# Patient Record
Sex: Female | Born: 1955 | Race: White | Hispanic: Yes | Marital: Single | Smoking: Never smoker
Health system: Southern US, Community
[De-identification: ages and names within clinical notes are randomized; demographics above are authoritative.]

## PROBLEM LIST (undated history)

## (undated) DIAGNOSIS — E119 Type 2 diabetes mellitus without complications: Secondary | ICD-10-CM

## (undated) DIAGNOSIS — D649 Anemia, unspecified: Secondary | ICD-10-CM

## (undated) DIAGNOSIS — E785 Hyperlipidemia, unspecified: Secondary | ICD-10-CM

## (undated) DIAGNOSIS — E669 Obesity, unspecified: Secondary | ICD-10-CM

## (undated) DIAGNOSIS — J45909 Unspecified asthma, uncomplicated: Secondary | ICD-10-CM

## (undated) DIAGNOSIS — M1990 Unspecified osteoarthritis, unspecified site: Secondary | ICD-10-CM

## (undated) DIAGNOSIS — I10 Essential (primary) hypertension: Secondary | ICD-10-CM

## (undated) HISTORY — DX: Unspecified osteoarthritis, unspecified site: M19.90

## (undated) HISTORY — DX: Type 2 diabetes mellitus without complications: E11.9

## (undated) HISTORY — DX: Obesity, unspecified: E66.9

## (undated) HISTORY — PX: HERNIA REPAIR: SHX51

## (undated) HISTORY — PX: UPPER GASTROINTESTINAL ENDOSCOPY: SHX188

## (undated) HISTORY — DX: Anemia, unspecified: D64.9

## (undated) HISTORY — DX: Essential (primary) hypertension: I10

## (undated) HISTORY — PX: OVARIAN CYST REMOVAL: SHX89

## (undated) HISTORY — DX: Hyperlipidemia, unspecified: E78.5

## (undated) HISTORY — DX: Unspecified asthma, uncomplicated: J45.909

## (undated) HISTORY — PX: COLONOSCOPY: SHX174

---

## 2017-07-07 ENCOUNTER — Other Ambulatory Visit: Payer: Self-pay

## 2017-07-07 ENCOUNTER — Ambulatory Visit (INDEPENDENT_AMBULATORY_CARE_PROVIDER_SITE_OTHER): Payer: BLUE CROSS/BLUE SHIELD | Admitting: Emergency Medicine

## 2017-07-07 ENCOUNTER — Encounter: Payer: Self-pay | Admitting: Emergency Medicine

## 2017-07-07 VITALS — BP 124/84 | HR 85 | Temp 97.9°F | Resp 16 | Ht 61.0 in | Wt 210.6 lb

## 2017-07-07 DIAGNOSIS — Z7689 Persons encountering health services in other specified circumstances: Secondary | ICD-10-CM

## 2017-07-07 DIAGNOSIS — Z1231 Encounter for screening mammogram for malignant neoplasm of breast: Secondary | ICD-10-CM | POA: Diagnosis not present

## 2017-07-07 DIAGNOSIS — Z23 Encounter for immunization: Secondary | ICD-10-CM | POA: Diagnosis not present

## 2017-07-07 DIAGNOSIS — E785 Hyperlipidemia, unspecified: Secondary | ICD-10-CM | POA: Diagnosis not present

## 2017-07-07 DIAGNOSIS — Z1239 Encounter for other screening for malignant neoplasm of breast: Secondary | ICD-10-CM | POA: Insufficient documentation

## 2017-07-07 DIAGNOSIS — I10 Essential (primary) hypertension: Secondary | ICD-10-CM

## 2017-07-07 DIAGNOSIS — E1159 Type 2 diabetes mellitus with other circulatory complications: Secondary | ICD-10-CM | POA: Insufficient documentation

## 2017-07-07 MED ORDER — AMLODIPINE BESYLATE 5 MG PO TABS: 5.0000 mg | ORAL_TABLET | Freq: Every day | ORAL | 3 refills | Status: DC

## 2017-07-07 MED ORDER — ATORVASTATIN CALCIUM 40 MG PO TABS: 40.0000 mg | ORAL_TABLET | Freq: Every day | ORAL | 3 refills | Status: DC

## 2017-07-07 NOTE — Progress Notes (Signed)
Catherine Cline 62 y.o.   Chief Complaint  Patient presents with  . Establish Care  . Medication Refill    Amlodipine and Atorvastatin    HISTORY OF PRESENT ILLNESS: This is a 62 y.o. female with a history of hypertension and high cholesterol, on amlodipine and atorvastatin, here to establish care.  Has no complaints.   HPI   Prior to Admission medications   Medication Sig Start Date End Date Taking? Authorizing Provider  amLODipine (NORVASC) 5 MG tablet Take 5 mg by mouth daily.   Yes [provider]  atorvastatin (LIPITOR) 40 MG tablet Take 40 mg by mouth daily.   Yes [provider]  Cholecalciferol (VITAMIN D3) 5000 units CAPS Take by mouth daily.   Yes [provider]  Flaxseed, Linseed, (FLAXSEED OIL PO) Take by mouth daily.   Yes [provider]  Meloxicam 15 MG TBDP Take by mouth daily.   Yes [provider]  Multiple Vitamin (MULTIVITAMIN) tablet Take 1 tablet by mouth daily.   Yes [provider]    No Known Allergies  There are no active problems to display for this patient.   Past Medical History:  Diagnosis Date  . Arthritis   . Asthma   . Hypertension     Past Surgical History:  Procedure Laterality Date  . OVARIAN CYST REMOVAL      Social History   Socioeconomic History  . Marital status: Single    Spouse name: Not on file  . Number of children: Not on file  . Years of education: Not on file  . Highest education level: Not on file  Occupational History  . Not on file  Social Needs  . Financial resource strain: Not on file  . Food insecurity:    Worry: Not on file    Inability: Not on file  . Transportation needs:    Medical: Not on file    Non-medical: Not on file  Tobacco Use  . Smoking status: Never Smoker  . Smokeless tobacco: Never Used  Substance and Sexual Activity  . Alcohol use: Not Currently    Comment: socially  . Drug use: Never  . Sexual activity: Not on file    Lifestyle  . Physical activity:    Days per week: Not on file    Minutes per session: Not on file  . Stress: Not on file  Relationships  . Social connections:    Talks on phone: Not on file    Gets together: Not on file    Attends religious service: Not on file    Active member of club or organization: Not on file    Attends meetings of clubs or organizations: Not on file    Relationship status: Not on file  . Intimate partner violence:    Fear of current or ex partner: Not on file    Emotionally abused: Not on file    Physically abused: Not on file    Forced sexual activity: Not on file  Other Topics Concern  . Not on file  Social History Narrative  . Not on file    Family History  Problem Relation Age of Onset  . Heart disease Mother   . Hypertension Mother   . Stroke Mother   . Diabetes Sister   . Hypertension Sister      Review of Systems  Constitutional: Negative.  Negative for chills and fever.  HENT: Negative.   Eyes: Negative.   Respiratory: Negative.  Negative for cough and shortness of breath.   Cardiovascular: Negative.  Negative for chest pain and palpitations.  Gastrointestinal: Negative.  Negative for abdominal pain, nausea and vomiting.  Genitourinary: Negative.  Negative for hematuria.  Musculoskeletal: Positive for joint pain (knees). Negative for back pain, myalgias and neck pain.  Skin: Negative.  Negative for rash.  Neurological: Negative.  Negative for dizziness and headaches.  Endo/Heme/Allergies: Negative.   All other systems reviewed and are negative.   Vitals:   07/07/17 1534  BP: 124/84  Pulse: 85  Resp: 16  Temp: 97.9 F (36.6 C)  SpO2: 95%    Physical Exam  Constitutional: She is oriented to person, place, and time. She appears well-developed and well-nourished.  HENT:  Head: Normocephalic and atraumatic.  Right Ear: External ear normal.  Left Ear: External ear normal.  Nose: Nose normal.  Mouth/Throat: Oropharynx is  clear and moist.  Eyes: Pupils are equal, round, and reactive to light. Conjunctivae are normal.  Neck: Normal range of motion. Neck supple. No JVD present. No thyromegaly present.  Cardiovascular: Normal rate, regular rhythm and normal heart sounds.  Pulmonary/Chest: Effort normal and breath sounds normal.  Abdominal: Soft. Bowel sounds are normal. There is no tenderness.  Musculoskeletal: Normal range of motion.  Lymphadenopathy:    She has no cervical adenopathy.  Neurological: She is alert and oriented to person, place, and time. No sensory deficit. She exhibits normal muscle tone.  Skin: Skin is warm and dry. Capillary refill takes less than 2 seconds.  Psychiatric: She has a normal mood and affect. Her behavior is normal.  Vitals reviewed.  A total of 30 minutes was spent in the room with the patient, greater than 50% of which was in counseling/coordination of care regarding chronic medical conditions, management, medications, nutrition, and need for follow-up.   ASSESSMENT & PLAN: Arzella was seen today for establish care and medication refill.  Diagnoses and all orders for this visit:  Essential hypertension  Need for diphtheria-tetanus-pertussis (Tdap) vaccine -     Tdap vaccine greater than or equal to 7yo IM  Hyperlipidemia, unspecified hyperlipidemia type  Encounter to establish care  Breast cancer screening -     MM Digital Screening; Future  Other orders -     Discontinue: amLODipine (NORVASC) 5 MG tablet; Take 1 tablet (5 mg total) by mouth daily. -     Discontinue: atorvastatin (LIPITOR) 40 MG tablet; Take 1 tablet (40 mg total) by mouth daily. -     atorvastatin (LIPITOR) 40 MG tablet; Take 1 tablet (40 mg total) by mouth daily. -     amLODipine (NORVASC) 5 MG tablet; Take 1 tablet (5 mg total) by mouth daily.    Patient Instructions       IF you received an x-ray today, you will receive an invoice from [facility]. Please contact [facility] at [phone] with questions or concerns regarding your invoice.   IF you received labwork today, you will receive an invoice from [facility]. Please contact [facility] at [phone] with questions or concerns regarding your invoice.   Our billing staff will not be able to assist you with questions regarding bills from these companies.  You will be contacted with the lab results as soon as they are available. The fastest way to get your results is to activate your My Chart account. Instructions are located on the last page of this paperwork. If you have not heard from [place] regarding the results in 2 weeks, please contact  this office.     Hypertension Hypertension, commonly called high blood pressure, is when the force of blood pumping through the arteries is too strong. The arteries are the blood vessels that carry blood from the heart throughout the body. Hypertension forces the heart to work harder to pump blood and may cause arteries to become narrow or stiff. Having untreated or uncontrolled hypertension can cause heart attacks, strokes, kidney disease, and other problems. A blood pressure reading consists of a higher number over a lower number. Ideally, your blood pressure should be below 120/80. The first ("top") number is called the systolic pressure. It is a measure of the pressure in your arteries as your heart beats. The second ("bottom") number is called the diastolic pressure. It is a measure of the pressure in your arteries as the heart relaxes. What are the causes? The cause of this condition is not known. What increases the risk? Some risk factors for high blood pressure are under your control. Others are not. Factors you can change  Smoking.  Having type 2 diabetes mellitus, high cholesterol, or both.  Not getting enough exercise or physical activity.  Being overweight.  Having too much fat, sugar, calories, or salt (sodium) in your diet.  Drinking too much  alcohol. Factors that are difficult or impossible to change  Having chronic kidney disease.  Having a family history of high blood pressure.  Age. Risk increases with age.  Race. You may be at higher risk if you are African-American.  Gender. Men are at higher risk than women before age 33. After age 51, women are at higher risk than men.  Having obstructive sleep apnea.  Stress. What are the signs or symptoms? Extremely high blood pressure (hypertensive crisis) may cause:  Headache.  Anxiety.  Shortness of breath.  Nosebleed.  Nausea and vomiting.  Severe chest pain.  Jerky movements you cannot control (seizures).  How is this diagnosed? This condition is diagnosed by measuring your blood pressure while you are seated, with your arm resting on a surface. The cuff of the blood pressure monitor will be placed directly against the skin of your upper arm at the level of your heart. It should be measured at least twice using the same arm. Certain conditions can cause a difference in blood pressure between your right and left arms. Certain factors can cause blood pressure readings to be lower or higher than normal (elevated) for a short period of time:  When your blood pressure is higher when you are in a health care provider's office than when you are at home, this is called white coat hypertension. Most people with this condition do not need medicines.  When your blood pressure is higher at home than when you are in a health care provider's office, this is called masked hypertension. Most people with this condition may need medicines to control blood pressure.  If you have a high blood pressure reading during one visit or you have normal blood pressure with other risk factors:  You may be asked to return on a different day to have your blood pressure checked again.  You may be asked to monitor your blood pressure at home for 1 week or longer.  If you are diagnosed with  hypertension, you may have other blood or imaging tests to help your health care provider understand your overall risk for other conditions. How is this treated? This condition is treated by making healthy lifestyle changes, such as eating healthy foods, exercising  more, and reducing your alcohol intake. Your health care provider may prescribe medicine if lifestyle changes are not enough to get your blood pressure under control, and if:  Your systolic blood pressure is above 130.  Your diastolic blood pressure is above 80.  Your personal target blood pressure may vary depending on your medical conditions, your age, and other factors. Follow these instructions at home: Eating and drinking  Eat a diet that is high in fiber and potassium, and low in sodium, added sugar, and fat. An example eating plan is called the DASH (Dietary Approaches to Stop Hypertension) diet. To eat this way: ? Eat plenty of fresh fruits and vegetables. Try to fill half of your plate at each meal with fruits and vegetables. ? Eat whole grains, such as whole wheat pasta, brown rice, or whole grain bread. Fill about one quarter of your plate with whole grains. ? Eat or drink low-fat dairy products, such as skim milk or low-fat yogurt. ? Avoid fatty cuts of meat, processed or cured meats, and poultry with skin. Fill about one quarter of your plate with lean proteins, such as fish, chicken without skin, beans, eggs, and tofu. ? Avoid premade and processed foods. These tend to be higher in sodium, added sugar, and fat.  Reduce your daily sodium intake. Most people with hypertension should eat less than 1,500 mg of sodium a day.  Limit alcohol intake to no more than 1 drink a day for nonpregnant women and 2 drinks a day for men. One drink equals 12 oz of beer, 5 oz of wine, or 1 oz of hard liquor. Lifestyle  Work with your health care provider to maintain a healthy body weight or to lose weight. Ask what an ideal weight is  for you.  Get at least 30 minutes of exercise that causes your heart to beat faster (aerobic exercise) most days of the week. Activities may include walking, swimming, or biking.  Include exercise to strengthen your muscles (resistance exercise), such as pilates or lifting weights, as part of your weekly exercise routine. Try to do these types of exercises for 30 minutes at least 3 days a week.  Do not use any products that contain nicotine or tobacco, such as cigarettes and e-cigarettes. If you need help quitting, ask your health care provider.  Monitor your blood pressure at home as told by your health care provider.  Keep all follow-up visits as told by your health care provider. This is important. Medicines  Take over-the-counter and prescription medicines only as told by your health care provider. Follow directions carefully. Blood pressure medicines must be taken as prescribed.  Do not skip doses of blood pressure medicine. Doing this puts you at risk for problems and can make the medicine less effective.  Ask your health care provider about side effects or reactions to medicines that you should watch for. Contact a health care provider if:  You think you are having a reaction to a medicine you are taking.  You have headaches that keep coming back (recurring).  You feel dizzy.  You have swelling in your ankles.  You have trouble with your vision. Get help right away if:  You develop a severe headache or confusion.  You have unusual weakness or numbness.  You feel faint.  You have severe pain in your chest or abdomen.  You vomit repeatedly.  You have trouble breathing. Summary  Hypertension is when the force of blood pumping through your arteries is too  strong. If this condition is not controlled, it may put you at risk for serious complications.  Your personal target blood pressure may vary depending on your medical conditions, your age, and other factors. For most  people, a normal blood pressure is less than 120/80.  Hypertension is treated with lifestyle changes, medicines, or a combination of both. Lifestyle changes include weight loss, eating a healthy, low-sodium diet, exercising more, and limiting alcohol. This information is not intended to replace advice given to you by your health care provider. Make sure you discuss any questions you have with your health care provider. Document Released: 01/20/2005 Document Revised: 12/19/2015 Document Reviewed: 12/19/2015 Elsevier Interactive Patient Education  2018 [employer] Inc.      Agustina Caroli, MD [facility]

## 2017-07-07 NOTE — Patient Instructions (Addendum)
   IF you received an x-ray today, you will receive an invoice from [facility]. Please contact [facility] at [phone] with questions or concerns regarding your invoice.   IF you received labwork today, you will receive an invoice from [facility]. Please contact [facility] at [phone] with questions or concerns regarding your invoice.   Our billing staff will not be able to assist you with questions regarding bills from these companies.  You will be contacted with the lab results as soon as they are available. The fastest way to get your results is to activate your My Chart account. Instructions are located on the last page of this paperwork. If you have not heard from us regarding the results in 2 weeks, please contact this office.     Hypertension Hypertension, commonly called high blood pressure, is when the force of blood pumping through the arteries is too strong. The arteries are the blood vessels that carry blood from the heart throughout the body. Hypertension forces the heart to work harder to pump blood and may cause arteries to become narrow or stiff. Having untreated or uncontrolled hypertension can cause heart attacks, strokes, kidney disease, and other problems. A blood pressure reading consists of a higher number over a lower number. Ideally, your blood pressure should be below 120/80. The first ("top") number is called the systolic pressure. It is a measure of the pressure in your arteries as your heart beats. The second ("bottom") number is called the diastolic pressure. It is a measure of the pressure in your arteries as the heart relaxes. What are the causes? The cause of this condition is not known. What increases the risk? Some risk factors for high blood pressure are under your control. Others are not. Factors you can change  Smoking.  Having type 2 diabetes mellitus, high cholesterol, or both.  Not getting enough exercise or physical  activity.  Being overweight.  Having too much fat, sugar, calories, or salt (sodium) in your diet.  Drinking too much alcohol. Factors that are difficult or impossible to change  Having chronic kidney disease.  Having a family history of high blood pressure.  Age. Risk increases with age.  Race. You may be at higher risk if you are African-American.  Gender. Men are at higher risk than women before age 45. After age 65, women are at higher risk than men.  Having obstructive sleep apnea.  Stress. What are the signs or symptoms? Extremely high blood pressure (hypertensive crisis) may cause:  Headache.  Anxiety.  Shortness of breath.  Nosebleed.  Nausea and vomiting.  Severe chest pain.  Jerky movements you cannot control (seizures).  How is this diagnosed? This condition is diagnosed by measuring your blood pressure while you are seated, with your arm resting on a surface. The cuff of the blood pressure monitor will be placed directly against the skin of your upper arm at the level of your heart. It should be measured at least twice using the same arm. Certain conditions can cause a difference in blood pressure between your right and left arms. Certain factors can cause blood pressure readings to be lower or higher than normal (elevated) for a short period of time:  When your blood pressure is higher when you are in a health care provider's office than when you are at home, this is called white coat hypertension. Most people with this condition do not need medicines.  When your blood pressure is higher at home than when you   are in a health care provider's office, this is called masked hypertension. Most people with this condition may need medicines to control blood pressure.  If you have a high blood pressure reading during one visit or you have normal blood pressure with other risk factors:  You may be asked to return on a different day to have your blood pressure  checked again.  You may be asked to monitor your blood pressure at home for 1 week or longer.  If you are diagnosed with hypertension, you may have other blood or imaging tests to help your health care provider understand your overall risk for other conditions. How is this treated? This condition is treated by making healthy lifestyle changes, such as eating healthy foods, exercising more, and reducing your alcohol intake. Your health care provider may prescribe medicine if lifestyle changes are not enough to get your blood pressure under control, and if:  Your systolic blood pressure is above 130.  Your diastolic blood pressure is above 80.  Your personal target blood pressure may vary depending on your medical conditions, your age, and other factors. Follow these instructions at home: Eating and drinking  Eat a diet that is high in fiber and potassium, and low in sodium, added sugar, and fat. An example eating plan is called the DASH (Dietary Approaches to Stop Hypertension) diet. To eat this way: ? Eat plenty of fresh fruits and vegetables. Try to fill half of your plate at each meal with fruits and vegetables. ? Eat whole grains, such as whole wheat pasta, brown rice, or whole grain bread. Fill about one quarter of your plate with whole grains. ? Eat or drink low-fat dairy products, such as skim milk or low-fat yogurt. ? Avoid fatty cuts of meat, processed or cured meats, and poultry with skin. Fill about one quarter of your plate with lean proteins, such as fish, chicken without skin, beans, eggs, and tofu. ? Avoid premade and processed foods. These tend to be higher in sodium, added sugar, and fat.  Reduce your daily sodium intake. Most people with hypertension should eat less than 1,500 mg of sodium a day.  Limit alcohol intake to no more than 1 drink a day for nonpregnant women and 2 drinks a day for men. One drink equals 12 oz of beer, 5 oz of wine, or 1 oz of hard  liquor. Lifestyle  Work with your health care provider to maintain a healthy body weight or to lose weight. Ask what an ideal weight is for you.  Get at least 30 minutes of exercise that causes your heart to beat faster (aerobic exercise) most days of the week. Activities may include walking, swimming, or biking.  Include exercise to strengthen your muscles (resistance exercise), such as pilates or lifting weights, as part of your weekly exercise routine. Try to do these types of exercises for 30 minutes at least 3 days a week.  Do not use any products that contain nicotine or tobacco, such as cigarettes and e-cigarettes. If you need help quitting, ask your health care provider.  Monitor your blood pressure at home as told by your health care provider.  Keep all follow-up visits as told by your health care provider. This is important. Medicines  Take over-the-counter and prescription medicines only as told by your health care provider. Follow directions carefully. Blood pressure medicines must be taken as prescribed.  Do not skip doses of blood pressure medicine. Doing this puts you at risk for problems and   can make the medicine less effective.  Ask your health care provider about side effects or reactions to medicines that you should watch for. Contact a health care provider if:  You think you are having a reaction to a medicine you are taking.  You have headaches that keep coming back (recurring).  You feel dizzy.  You have swelling in your ankles.  You have trouble with your vision. Get help right away if:  You develop a severe headache or confusion.  You have unusual weakness or numbness.  You feel faint.  You have severe pain in your chest or abdomen.  You vomit repeatedly.  You have trouble breathing. Summary  Hypertension is when the force of blood pumping through your arteries is too strong. If this condition is not controlled, it may put you at risk for serious  complications.  Your personal target blood pressure may vary depending on your medical conditions, your age, and other factors. For most people, a normal blood pressure is less than 120/80.  Hypertension is treated with lifestyle changes, medicines, or a combination of both. Lifestyle changes include weight loss, eating a healthy, low-sodium diet, exercising more, and limiting alcohol. This information is not intended to replace advice given to you by your health care provider. Make sure you discuss any questions you have with your health care provider. Document Released: 01/20/2005 Document Revised: 12/19/2015 Document Reviewed: 12/19/2015 Elsevier Interactive Patient Education  2018 [employer]  

## 2017-07-17 ENCOUNTER — Other Ambulatory Visit: Payer: Self-pay

## 2017-07-17 ENCOUNTER — Ambulatory Visit (INDEPENDENT_AMBULATORY_CARE_PROVIDER_SITE_OTHER): Payer: BLUE CROSS/BLUE SHIELD | Admitting: Emergency Medicine

## 2017-07-17 ENCOUNTER — Encounter: Payer: Self-pay | Admitting: Emergency Medicine

## 2017-07-17 VITALS — BP 120/80 | HR 76 | Temp 98.3°F | Resp 16 | Ht 61.81 in | Wt 214.0 lb

## 2017-07-17 DIAGNOSIS — I10 Essential (primary) hypertension: Secondary | ICD-10-CM | POA: Diagnosis not present

## 2017-07-17 DIAGNOSIS — Z01818 Encounter for other preprocedural examination: Secondary | ICD-10-CM | POA: Diagnosis not present

## 2017-07-17 DIAGNOSIS — E785 Hyperlipidemia, unspecified: Secondary | ICD-10-CM | POA: Diagnosis not present

## 2017-07-17 LAB — POCT URINALYSIS DIP (MANUAL ENTRY)
BILIRUBIN UA: NEGATIVE mg/dL
Bilirubin, UA: NEGATIVE
Blood, UA: NEGATIVE
Glucose, UA: NEGATIVE mg/dL
Nitrite, UA: POSITIVE — AB
SPEC GRAV UA: 1.025 (ref 1.010–1.025)
Urobilinogen, UA: 0.2 E.U./dL
pH, UA: 7 (ref 5.0–8.0)

## 2017-07-17 NOTE — Patient Instructions (Addendum)
   IF you received an x-ray today, you will receive an invoice from [facility]. Please contact [facility] at [phone] with questions or concerns regarding your invoice.   IF you received labwork today, you will receive an invoice from [facility]. Please contact [facility] at [phone] with questions or concerns regarding your invoice.   Our billing staff will not be able to assist you with questions regarding bills from these companies.  You will be contacted with the lab results as soon as they are available. The fastest way to get your results is to activate your My Chart account. Instructions are located on the last page of this paperwork. If you have not heard from us regarding the results in 2 weeks, please contact this office.     Hypertension Hypertension, commonly called high blood pressure, is when the force of blood pumping through the arteries is too strong. The arteries are the blood vessels that carry blood from the heart throughout the body. Hypertension forces the heart to work harder to pump blood and may cause arteries to become narrow or stiff. Having untreated or uncontrolled hypertension can cause heart attacks, strokes, kidney disease, and other problems. A blood pressure reading consists of a higher number over a lower number. Ideally, your blood pressure should be below 120/80. The first ("top") number is called the systolic pressure. It is a measure of the pressure in your arteries as your heart beats. The second ("bottom") number is called the diastolic pressure. It is a measure of the pressure in your arteries as the heart relaxes. What are the causes? The cause of this condition is not known. What increases the risk? Some risk factors for high blood pressure are under your control. Others are not. Factors you can change  Smoking.  Having type 2 diabetes mellitus, high cholesterol, or both.  Not getting enough exercise or physical  activity.  Being overweight.  Having too much fat, sugar, calories, or salt (sodium) in your diet.  Drinking too much alcohol. Factors that are difficult or impossible to change  Having chronic kidney disease.  Having a family history of high blood pressure.  Age. Risk increases with age.  Race. You may be at higher risk if you are African-American.  Gender. Men are at higher risk than women before age 45. After age 65, women are at higher risk than men.  Having obstructive sleep apnea.  Stress. What are the signs or symptoms? Extremely high blood pressure (hypertensive crisis) may cause:  Headache.  Anxiety.  Shortness of breath.  Nosebleed.  Nausea and vomiting.  Severe chest pain.  Jerky movements you cannot control (seizures).  How is this diagnosed? This condition is diagnosed by measuring your blood pressure while you are seated, with your arm resting on a surface. The cuff of the blood pressure monitor will be placed directly against the skin of your upper arm at the level of your heart. It should be measured at least twice using the same arm. Certain conditions can cause a difference in blood pressure between your right and left arms. Certain factors can cause blood pressure readings to be lower or higher than normal (elevated) for a short period of time:  When your blood pressure is higher when you are in a health care provider's office than when you are at home, this is called white coat hypertension. Most people with this condition do not need medicines.  When your blood pressure is higher at home than when you   are in a health care provider's office, this is called masked hypertension. Most people with this condition may need medicines to control blood pressure.  If you have a high blood pressure reading during one visit or you have normal blood pressure with other risk factors:  You may be asked to return on a different day to have your blood pressure  checked again.  You may be asked to monitor your blood pressure at home for 1 week or longer.  If you are diagnosed with hypertension, you may have other blood or imaging tests to help your health care provider understand your overall risk for other conditions. How is this treated? This condition is treated by making healthy lifestyle changes, such as eating healthy foods, exercising more, and reducing your alcohol intake. Your health care provider may prescribe medicine if lifestyle changes are not enough to get your blood pressure under control, and if:  Your systolic blood pressure is above 130.  Your diastolic blood pressure is above 80.  Your personal target blood pressure may vary depending on your medical conditions, your age, and other factors. Follow these instructions at home: Eating and drinking  Eat a diet that is high in fiber and potassium, and low in sodium, added sugar, and fat. An example eating plan is called the DASH (Dietary Approaches to Stop Hypertension) diet. To eat this way: ? Eat plenty of fresh fruits and vegetables. Try to fill half of your plate at each meal with fruits and vegetables. ? Eat whole grains, such as whole wheat pasta, brown rice, or whole grain bread. Fill about one quarter of your plate with whole grains. ? Eat or drink low-fat dairy products, such as skim milk or low-fat yogurt. ? Avoid fatty cuts of meat, processed or cured meats, and poultry with skin. Fill about one quarter of your plate with lean proteins, such as fish, chicken without skin, beans, eggs, and tofu. ? Avoid premade and processed foods. These tend to be higher in sodium, added sugar, and fat.  Reduce your daily sodium intake. Most people with hypertension should eat less than 1,500 mg of sodium a day.  Limit alcohol intake to no more than 1 drink a day for nonpregnant women and 2 drinks a day for men. One drink equals 12 oz of beer, 5 oz of wine, or 1 oz of hard  liquor. Lifestyle  Work with your health care provider to maintain a healthy body weight or to lose weight. Ask what an ideal weight is for you.  Get at least 30 minutes of exercise that causes your heart to beat faster (aerobic exercise) most days of the week. Activities may include walking, swimming, or biking.  Include exercise to strengthen your muscles (resistance exercise), such as pilates or lifting weights, as part of your weekly exercise routine. Try to do these types of exercises for 30 minutes at least 3 days a week.  Do not use any products that contain nicotine or tobacco, such as cigarettes and e-cigarettes. If you need help quitting, ask your health care provider.  Monitor your blood pressure at home as told by your health care provider.  Keep all follow-up visits as told by your health care provider. This is important. Medicines  Take over-the-counter and prescription medicines only as told by your health care provider. Follow directions carefully. Blood pressure medicines must be taken as prescribed.  Do not skip doses of blood pressure medicine. Doing this puts you at risk for problems and   can make the medicine less effective.  Ask your health care provider about side effects or reactions to medicines that you should watch for. Contact a health care provider if:  You think you are having a reaction to a medicine you are taking.  You have headaches that keep coming back (recurring).  You feel dizzy.  You have swelling in your ankles.  You have trouble with your vision. Get help right away if:  You develop a severe headache or confusion.  You have unusual weakness or numbness.  You feel faint.  You have severe pain in your chest or abdomen.  You vomit repeatedly.  You have trouble breathing. Summary  Hypertension is when the force of blood pumping through your arteries is too strong. If this condition is not controlled, it may put you at risk for serious  complications.  Your personal target blood pressure may vary depending on your medical conditions, your age, and other factors. For most people, a normal blood pressure is less than 120/80.  Hypertension is treated with lifestyle changes, medicines, or a combination of both. Lifestyle changes include weight loss, eating a healthy, low-sodium diet, exercising more, and limiting alcohol. This information is not intended to replace advice given to you by your health care provider. Make sure you discuss any questions you have with your health care provider. Document Released: 01/20/2005 Document Revised: 12/19/2015 Document Reviewed: 12/19/2015 Elsevier Interactive Patient Education  2018 Elsevier Inc.  

## 2017-07-17 NOTE — Progress Notes (Signed)
Catherine Cline 62 y.o.   Chief Complaint  Patient presents with  . Surgical Clearance    HISTORY OF PRESENT ILLNESS: This is a 62 y.o. female scheduled for partial left knee replacement on July 1.  Here for surgical clearance.  Has a history of diabetes and high cholesterol.  On medications.  No other significant past medical history except childhood asthma.  HPI   Prior to Admission medications   Medication Sig Start Date End Date Taking? Authorizing Provider  amLODipine (NORVASC) 5 MG tablet Take 1 tablet (5 mg total) by mouth daily. 07/07/17  Yes Lukasz Rogus, Ines Bloomer, MD  atorvastatin (LIPITOR) 40 MG tablet Take 1 tablet (40 mg total) by mouth daily. 07/07/17  Yes Saamiya Jeppsen, Ines Bloomer, MD  Cholecalciferol (VITAMIN D3) 5000 units CAPS Take by mouth daily.   Yes [provider]  Flaxseed, Linseed, (FLAXSEED OIL PO) Take by mouth daily.   Yes [provider]  Meloxicam 15 MG TBDP Take by mouth daily.   Yes [provider]  Multiple Vitamin (MULTIVITAMIN) tablet Take 1 tablet by mouth daily.   Yes [provider]    No Known Allergies  Patient Active Problem List   Diagnosis Date Noted  . Essential hypertension 07/07/2017  . Hyperlipidemia 07/07/2017  . Breast cancer screening 07/07/2017    Past Medical History:  Diagnosis Date  . Arthritis   . Asthma   . Hypertension     Past Surgical History:  Procedure Laterality Date  . OVARIAN CYST REMOVAL      Social History   Socioeconomic History  . Marital status: Single    Spouse name: Not on file  . Number of children: Not on file  . Years of education: Not on file  . Highest education level: Not on file  Occupational History  . Not on file  Social Needs  . Financial resource strain: Not on file  . Food insecurity:    Worry: Not on file    Inability: Not on file  . Transportation needs:    Medical: Not on file    Non-medical: Not on file  Tobacco Use  . Smoking status: Never  Smoker  . Smokeless tobacco: Never Used  Substance and Sexual Activity  . Alcohol use: Not Currently    Comment: socially  . Drug use: Never  . Sexual activity: Not on file  Lifestyle  . Physical activity:    Days per week: Not on file    Minutes per session: Not on file  . Stress: Not on file  Relationships  . Social connections:    Talks on phone: Not on file    Gets together: Not on file    Attends religious service: Not on file    Active member of club or organization: Not on file    Attends meetings of clubs or organizations: Not on file    Relationship status: Not on file  . Intimate partner violence:    Fear of current or ex partner: Not on file    Emotionally abused: Not on file    Physically abused: Not on file    Forced sexual activity: Not on file  Other Topics Concern  . Not on file  Social History Narrative  . Not on file    Family History  Problem Relation Age of Onset  . Heart disease Mother   . Hypertension Mother   . Stroke Mother   . Diabetes Sister   . Hypertension Sister  Review of Systems  Constitutional: Negative.  Negative for fever and malaise/fatigue.  HENT: Negative.  Negative for congestion, nosebleeds and sore throat.   Eyes: Negative.  Negative for discharge and redness.  Respiratory: Negative for cough, shortness of breath and wheezing.   Cardiovascular: Positive for leg swelling (Ankle edema since starting amlodipine). Negative for chest pain and palpitations.  Gastrointestinal: Negative.  Negative for abdominal pain, nausea and vomiting.  Genitourinary: Negative.   Musculoskeletal: Negative.   Skin: Negative.  Negative for rash.  Neurological: Negative for dizziness and headaches.  Endo/Heme/Allergies: Negative.   All other systems reviewed and are negative.   Vitals:   07/17/17 1323  BP: 120/80  Pulse: 76  Resp: 16  Temp: 98.3 F (36.8 C)  SpO2: 96%    Physical Exam  Constitutional: She is oriented to person,  place, and time. She appears well-developed and well-nourished.  HENT:  Head: Normocephalic and atraumatic.  Right Ear: External ear normal.  Left Ear: External ear normal.  Nose: Nose normal.  Mouth/Throat: Oropharynx is clear and moist.  Eyes: Pupils are equal, round, and reactive to light. Conjunctivae and EOM are normal.  Neck: Normal range of motion. Neck supple. No JVD present. No thyromegaly present.  Cardiovascular: Normal rate, regular rhythm and normal heart sounds.  Pulmonary/Chest: Effort normal and breath sounds normal.  Abdominal: Soft. Bowel sounds are normal. She exhibits no distension. There is no tenderness.  Musculoskeletal: Normal range of motion. She exhibits no edema or tenderness.  Lymphadenopathy:    She has no cervical adenopathy.  Neurological: She is alert and oriented to person, place, and time. No sensory deficit. She exhibits normal muscle tone. Coordination normal.  Skin: Skin is warm and dry. Capillary refill takes less than 2 seconds.  Psychiatric: She has a normal mood and affect. Her behavior is normal.  Vitals reviewed.  EKG: Normal sinus rhythm with ventricular rate of 74.  No acute ischemic changes.  Normal EKG.  ASSESSMENT & PLAN: Malyia was seen today for surgical clearance.  Diagnoses and all orders for this visit:  Preoperative clearance -     CBC with Differential/Platelet -     Comprehensive metabolic panel -     Hemoglobin A1c -     POCT urinalysis dipstick -     EKG 12-Lead  Essential hypertension  Hyperlipidemia, unspecified hyperlipidemia type    Patient Instructions       IF you received an x-ray today, you will receive an invoice from [facility]. Please contact [facility] at [phone] with questions or concerns regarding your invoice.   IF you received labwork today, you will receive an invoice from [facility]. Please contact [facility] at [phone] with questions or concerns regarding your invoice.     Our billing staff will not be able to assist you with questions regarding bills from these companies.  You will be contacted with the lab results as soon as they are available. The fastest way to get your results is to activate your My Chart account. Instructions are located on the last page of this paperwork. If you have not heard from [place] regarding the results in 2 weeks, please contact this office.      Hypertension Hypertension, commonly called high blood pressure, is when the force of blood pumping through the arteries is too strong. The arteries are the blood vessels that carry blood from the heart throughout the body. Hypertension forces the heart to work harder to pump blood and may cause arteries to  become narrow or stiff. Having untreated or uncontrolled hypertension can cause heart attacks, strokes, kidney disease, and other problems. A blood pressure reading consists of a higher number over a lower number. Ideally, your blood pressure should be below 120/80. The first ("top") number is called the systolic pressure. It is a measure of the pressure in your arteries as your heart beats. The second ("bottom") number is called the diastolic pressure. It is a measure of the pressure in your arteries as the heart relaxes. What are the causes? The cause of this condition is not known. What increases the risk? Some risk factors for high blood pressure are under your control. Others are not. Factors you can change  Smoking.  Having type 2 diabetes mellitus, high cholesterol, or both.  Not getting enough exercise or physical activity.  Being overweight.  Having too much fat, sugar, calories, or salt (sodium) in your diet.  Drinking too much alcohol. Factors that are difficult or impossible to change  Having chronic kidney disease.  Having a family history of high blood pressure.  Age. Risk increases with age.  Race. You may be at higher risk if you are  African-American.  Gender. Men are at higher risk than women before age 22. After age 59, women are at higher risk than men.  Having obstructive sleep apnea.  Stress. What are the signs or symptoms? Extremely high blood pressure (hypertensive crisis) may cause:  Headache.  Anxiety.  Shortness of breath.  Nosebleed.  Nausea and vomiting.  Severe chest pain.  Jerky movements you cannot control (seizures).  How is this diagnosed? This condition is diagnosed by measuring your blood pressure while you are seated, with your arm resting on a surface. The cuff of the blood pressure monitor will be placed directly against the skin of your upper arm at the level of your heart. It should be measured at least twice using the same arm. Certain conditions can cause a difference in blood pressure between your right and left arms. Certain factors can cause blood pressure readings to be lower or higher than normal (elevated) for a short period of time:  When your blood pressure is higher when you are in a health care provider's office than when you are at home, this is called white coat hypertension. Most people with this condition do not need medicines.  When your blood pressure is higher at home than when you are in a health care provider's office, this is called masked hypertension. Most people with this condition may need medicines to control blood pressure.  If you have a high blood pressure reading during one visit or you have normal blood pressure with other risk factors:  You may be asked to return on a different day to have your blood pressure checked again.  You may be asked to monitor your blood pressure at home for 1 week or longer.  If you are diagnosed with hypertension, you may have other blood or imaging tests to help your health care provider understand your overall risk for other conditions. How is this treated? This condition is treated by making healthy lifestyle changes,  such as eating healthy foods, exercising more, and reducing your alcohol intake. Your health care provider may prescribe medicine if lifestyle changes are not enough to get your blood pressure under control, and if:  Your systolic blood pressure is above 130.  Your diastolic blood pressure is above 80.  Your personal target blood pressure may vary depending on your medical  conditions, your age, and other factors. Follow these instructions at home: Eating and drinking  Eat a diet that is high in fiber and potassium, and low in sodium, added sugar, and fat. An example eating plan is called the DASH (Dietary Approaches to Stop Hypertension) diet. To eat this way: ? Eat plenty of fresh fruits and vegetables. Try to fill half of your plate at each meal with fruits and vegetables. ? Eat whole grains, such as whole wheat pasta, brown rice, or whole grain bread. Fill about one quarter of your plate with whole grains. ? Eat or drink low-fat dairy products, such as skim milk or low-fat yogurt. ? Avoid fatty cuts of meat, processed or cured meats, and poultry with skin. Fill about one quarter of your plate with lean proteins, such as fish, chicken without skin, beans, eggs, and tofu. ? Avoid premade and processed foods. These tend to be higher in sodium, added sugar, and fat.  Reduce your daily sodium intake. Most people with hypertension should eat less than 1,500 mg of sodium a day.  Limit alcohol intake to no more than 1 drink a day for nonpregnant women and 2 drinks a day for men. One drink equals 12 oz of beer, 5 oz of wine, or 1 oz of hard liquor. Lifestyle  Work with your health care provider to maintain a healthy body weight or to lose weight. Ask what an ideal weight is for you.  Get at least 30 minutes of exercise that causes your heart to beat faster (aerobic exercise) most days of the week. Activities may include walking, swimming, or biking.  Include exercise to strengthen your muscles  (resistance exercise), such as pilates or lifting weights, as part of your weekly exercise routine. Try to do these types of exercises for 30 minutes at least 3 days a week.  Do not use any products that contain nicotine or tobacco, such as cigarettes and e-cigarettes. If you need help quitting, ask your health care provider.  Monitor your blood pressure at home as told by your health care provider.  Keep all follow-up visits as told by your health care provider. This is important. Medicines  Take over-the-counter and prescription medicines only as told by your health care provider. Follow directions carefully. Blood pressure medicines must be taken as prescribed.  Do not skip doses of blood pressure medicine. Doing this puts you at risk for problems and can make the medicine less effective.  Ask your health care provider about side effects or reactions to medicines that you should watch for. Contact a health care provider if:  You think you are having a reaction to a medicine you are taking.  You have headaches that keep coming back (recurring).  You feel dizzy.  You have swelling in your ankles.  You have trouble with your vision. Get help right away if:  You develop a severe headache or confusion.  You have unusual weakness or numbness.  You feel faint.  You have severe pain in your chest or abdomen.  You vomit repeatedly.  You have trouble breathing. Summary  Hypertension is when the force of blood pumping through your arteries is too strong. If this condition is not controlled, it may put you at risk for serious complications.  Your personal target blood pressure may vary depending on your medical conditions, your age, and other factors. For most people, a normal blood pressure is less than 120/80.  Hypertension is treated with lifestyle changes, medicines, or a combination  of both. Lifestyle changes include weight loss, eating a healthy, low-sodium diet, exercising  more, and limiting alcohol. This information is not intended to replace advice given to you by your health care provider. Make sure you discuss any questions you have with your health care provider. Document Released: 01/20/2005 Document Revised: 12/19/2015 Document Reviewed: 12/19/2015 Elsevier Interactive Patient Education  2018 [employer]vier Inc.      Agustina Caroli, MD [facility]

## 2017-07-18 LAB — COMPREHENSIVE METABOLIC PANEL
ALBUMIN: 4.2 g/dL (ref 3.6–4.8)
ALK PHOS: 111 IU/L (ref 39–117)
ALT: 17 IU/L (ref 0–32)
AST: 16 IU/L (ref 0–40)
Albumin/Globulin Ratio: 1.6 (ref 1.2–2.2)
BUN / CREAT RATIO: 36 — AB (ref 12–28)
BUN: 21 mg/dL (ref 8–27)
CHLORIDE: 104 mmol/L (ref 96–106)
CO2: 24 mmol/L (ref 20–29)
CREATININE: 0.58 mg/dL (ref 0.57–1.00)
Calcium: 9.4 mg/dL (ref 8.7–10.3)
GFR calc Af Amer: 116 mL/min/{1.73_m2} (ref >59)
GFR calc non Af Amer: 101 mL/min/{1.73_m2} (ref >59)
GLOBULIN, TOTAL: 2.7 g/dL (ref 1.5–4.5)
Glucose: 129 mg/dL — ABNORMAL HIGH (ref 65–99)
Potassium: 4.2 mmol/L (ref 3.5–5.2)
SODIUM: 141 mmol/L (ref 134–144)
Total Protein: 6.9 g/dL (ref 6.0–8.5)

## 2017-07-18 LAB — CBC WITH DIFFERENTIAL/PLATELET
BASOS: 0 %
Basophils Absolute: 0 10*3/uL (ref 0.0–0.2)
EOS (ABSOLUTE): 0.2 10*3/uL (ref 0.0–0.4)
EOS: 3 %
HEMATOCRIT: 37.4 % (ref 34.0–46.6)
HEMOGLOBIN: 11.6 g/dL (ref 11.1–15.9)
IMMATURE GRANS (ABS): 0 10*3/uL (ref 0.0–0.1)
Immature Granulocytes: 0 %
LYMPHS ABS: 1.9 10*3/uL (ref 0.7–3.1)
LYMPHS: 23 %
MCH: 24.6 pg — AB (ref 26.6–33.0)
MCHC: 31 g/dL — AB (ref 31.5–35.7)
MCV: 79 fL (ref 79–97)
MONOCYTES: 5 %
Monocytes Absolute: 0.4 10*3/uL (ref 0.1–0.9)
Neutrophils Absolute: 5.6 10*3/uL (ref 1.4–7.0)
Neutrophils: 69 %
Platelets: 315 10*3/uL (ref 150–450)
RBC: 4.71 x10E6/uL (ref 3.77–5.28)
RDW: 14.7 % (ref 12.3–15.4)
WBC: 8.1 10*3/uL (ref 3.4–10.8)

## 2017-07-18 LAB — HEMOGLOBIN A1C
Est. average glucose Bld gHb Est-mCnc: 143 mg/dL
HEMOGLOBIN A1C: 6.6 % — AB (ref 4.8–5.6)

## 2017-07-20 ENCOUNTER — Other Ambulatory Visit: Payer: Self-pay | Admitting: Emergency Medicine

## 2017-07-20 ENCOUNTER — Encounter: Payer: Self-pay | Admitting: Emergency Medicine

## 2017-07-20 MED ORDER — METFORMIN HCL 500 MG PO TABS: 500.0000 mg | ORAL_TABLET | Freq: Two times a day (BID) | ORAL | 3 refills | Status: DC

## 2017-07-21 ENCOUNTER — Telehealth: Payer: Self-pay | Admitting: Emergency Medicine

## 2017-07-21 ENCOUNTER — Other Ambulatory Visit: Payer: Self-pay | Admitting: Emergency Medicine

## 2017-07-21 ENCOUNTER — Telehealth: Payer: Self-pay | Admitting: *Deleted

## 2017-07-21 MED ORDER — METFORMIN HCL 500 MG PO TABS
500.0000 mg | ORAL_TABLET | Freq: Two times a day (BID) | ORAL | 3 refills | Status: DC
Start: 2017-07-21 — End: 2017-07-21

## 2017-07-21 MED ORDER — METFORMIN HCL 500 MG PO TABS: 500.0000 mg | ORAL_TABLET | Freq: Two times a day (BID) | ORAL | 3 refills | Status: DC

## 2017-07-21 MED ORDER — METFORMIN HCL 500 MG PO TABS
500.0000 mg | ORAL_TABLET | Freq: Two times a day (BID) | ORAL | 3 refills | Status: DC
Start: 2017-07-21 — End: 2018-01-14

## 2017-07-21 NOTE — Telephone Encounter (Signed)
Lab results and plan discussed with patient.

## 2017-07-21 NOTE — Telephone Encounter (Signed)
Faxed prescription for Metformin 500 mg to patient pharmacy. Confirmation page received at 6:18 pm.

## 2017-07-22 ENCOUNTER — Telehealth: Payer: Self-pay | Admitting: *Deleted

## 2017-07-22 NOTE — Telephone Encounter (Signed)
Faxed at 10:42 am completed preoperative clearance form and documents to ATTN: Orson Slick at [facility]  confirmation page received at 10:47 am.

## 2018-01-14 ENCOUNTER — Encounter: Payer: Self-pay | Admitting: Emergency Medicine

## 2018-01-14 ENCOUNTER — Ambulatory Visit (INDEPENDENT_AMBULATORY_CARE_PROVIDER_SITE_OTHER): Payer: BLUE CROSS/BLUE SHIELD | Admitting: Emergency Medicine

## 2018-01-14 ENCOUNTER — Other Ambulatory Visit: Payer: Self-pay

## 2018-01-14 ENCOUNTER — Ambulatory Visit (INDEPENDENT_AMBULATORY_CARE_PROVIDER_SITE_OTHER): Payer: BLUE CROSS/BLUE SHIELD

## 2018-01-14 VITALS — BP 173/92 | HR 76 | Temp 98.4°F | Resp 16 | Ht 61.0 in | Wt 210.8 lb

## 2018-01-14 DIAGNOSIS — M2550 Pain in unspecified joint: Secondary | ICD-10-CM

## 2018-01-14 DIAGNOSIS — R9389 Abnormal findings on diagnostic imaging of other specified body structures: Secondary | ICD-10-CM

## 2018-01-14 DIAGNOSIS — E1169 Type 2 diabetes mellitus with other specified complication: Secondary | ICD-10-CM | POA: Insufficient documentation

## 2018-01-14 DIAGNOSIS — E119 Type 2 diabetes mellitus without complications: Secondary | ICD-10-CM | POA: Diagnosis not present

## 2018-01-14 DIAGNOSIS — Z23 Encounter for immunization: Secondary | ICD-10-CM

## 2018-01-14 DIAGNOSIS — E785 Hyperlipidemia, unspecified: Secondary | ICD-10-CM | POA: Diagnosis not present

## 2018-01-14 DIAGNOSIS — I10 Essential (primary) hypertension: Secondary | ICD-10-CM

## 2018-01-14 DIAGNOSIS — G8929 Other chronic pain: Secondary | ICD-10-CM

## 2018-01-14 LAB — POCT GLYCOSYLATED HEMOGLOBIN (HGB A1C): HEMOGLOBIN A1C: 6.6 % — AB (ref 4.0–5.6)

## 2018-01-14 LAB — GLUCOSE, POCT (MANUAL RESULT ENTRY): POC Glucose: 115 mg/dl — AB (ref 70–99)

## 2018-01-14 MED ORDER — METFORMIN HCL 1000 MG PO TABS: 1000.0000 mg | ORAL_TABLET | Freq: Two times a day (BID) | ORAL | 3 refills | Status: DC

## 2018-01-14 MED ORDER — LISINOPRIL 10 MG PO TABS: 10.0000 mg | ORAL_TABLET | Freq: Every day | ORAL | 3 refills | Status: DC

## 2018-01-14 MED ORDER — LOSARTAN POTASSIUM 50 MG PO TABS: 50.0000 mg | ORAL_TABLET | Freq: Every day | ORAL | 3 refills | Status: DC

## 2018-01-14 NOTE — Assessment & Plan Note (Signed)
Uncontrolled blood pressure.  Continue amlodipine 5 mg.  Will add Cozaar 50 mg daily.  Follow-up in 6 months.

## 2018-01-14 NOTE — Progress Notes (Signed)
Catherine Cline 62 y.o.   Chief Complaint  Patient presents with  . Hypertension    follow up 6 month    HISTORY OF PRESENT ILLNESS: This is a 62 y.o. female with history of hypertension and diabetes here for follow-up. Also has a history of arthritis and multiple joint pain for at least the last 3 years.  Chronic pain that is getting worse.  Has never seen a rheumatologist. Also states that she recently went to urgent care clinic with flulike symptoms and cough.  Had chest x-ray that showed some lucency on the right side and she was recommended to get a CT scan of the chest.  BP Readings from Last 3 Encounters:  01/14/18 (!) 173/92  07/17/17 120/80  07/07/17 124/84   Lab Results  Component Value Date   HGBA1C 6.6 (H) 07/17/2017   Wt Readings from Last 3 Encounters:  01/14/18 210 lb 12.8 oz (95.6 kg)  07/17/17 214 lb (97.1 kg)  07/07/17 210 lb 9.6 oz (95.5 kg)    HPI   Prior to Admission medications   Medication Sig Start Date End Date Taking? Authorizing Provider  amLODipine (NORVASC) 5 MG tablet Take 1 tablet (5 mg total) by mouth daily. 07/07/17  Yes Pearse Shiffler, Ines Bloomer, MD  atorvastatin (LIPITOR) 40 MG tablet Take 1 tablet (40 mg total) by mouth daily. 07/07/17  Yes Cedric Mcclaine, Ines Bloomer, MD  Cholecalciferol (VITAMIN D3) 5000 units CAPS Take by mouth daily.   Yes [provider]  Flaxseed, Linseed, (FLAXSEED OIL PO) Take by mouth daily.   Yes [provider]  Meloxicam 15 MG TBDP Take by mouth daily.   Yes [provider]  metFORMIN (GLUCOPHAGE) 500 MG tablet Take 1 tablet (500 mg total) by mouth 2 (two) times daily with a meal. 07/21/17  Yes Kathlene Yano, Ines Bloomer, MD  Multiple Vitamin (MULTIVITAMIN) tablet Take 1 tablet by mouth daily.   Yes [provider]    No Known Allergies  Patient Active Problem List   Diagnosis Date Noted  . Preoperative clearance 07/17/2017  . Essential hypertension 07/07/2017  . Hyperlipidemia 07/07/2017   . Breast cancer screening 07/07/2017    Past Medical History:  Diagnosis Date  . Arthritis   . Asthma   . Hypertension     Past Surgical History:  Procedure Laterality Date  . OVARIAN CYST REMOVAL      Social History   Socioeconomic History  . Marital status: Single    Spouse name: Not on file  . Number of children: Not on file  . Years of education: Not on file  . Highest education level: Not on file  Occupational History  . Not on file  Social Needs  . Financial resource strain: Not on file  . Food insecurity:    Worry: Not on file    Inability: Not on file  . Transportation needs:    Medical: Not on file    Non-medical: Not on file  Tobacco Use  . Smoking status: Never Smoker  . Smokeless tobacco: Never Used  Substance and Sexual Activity  . Alcohol use: Not Currently    Comment: socially  . Drug use: Never  . Sexual activity: Not on file  Lifestyle  . Physical activity:    Days per week: Not on file    Minutes per session: Not on file  . Stress: Not on file  Relationships  . Social connections:    Talks on phone: Not on file  Gets together: Not on file    Attends religious service: Not on file    Active member of club or organization: Not on file    Attends meetings of clubs or organizations: Not on file    Relationship status: Not on file  . Intimate partner violence:    Fear of current or ex partner: Not on file    Emotionally abused: Not on file    Physically abused: Not on file    Forced sexual activity: Not on file  Other Topics Concern  . Not on file  Social History Narrative  . Not on file    Family History  Problem Relation Age of Onset  . Heart disease Mother   . Hypertension Mother   . Stroke Mother   . Diabetes Sister   . Hypertension Sister      Review of Systems  Constitutional: Negative.  Negative for chills, fever and malaise/fatigue.  HENT: Negative.  Negative for congestion, hearing loss, nosebleeds and sore throat.    Eyes: Negative.  Negative for blurred vision and double vision.  Respiratory: Negative.  Negative for cough, hemoptysis and shortness of breath.   Cardiovascular: Negative.  Negative for chest pain, palpitations and leg swelling.  Gastrointestinal: Negative.  Negative for abdominal pain, blood in stool, constipation, diarrhea, melena, nausea and vomiting.  Genitourinary: Negative.  Negative for hematuria.  Musculoskeletal: Positive for joint pain (Multiple).  Skin: Negative.  Negative for rash.  Neurological: Negative.  Negative for dizziness and headaches.  Endo/Heme/Allergies: Negative.   All other systems reviewed and are negative.   Vitals:   01/14/18 1027  BP: (!) 173/92  Pulse: 76  Resp: 16  Temp: 98.4 F (36.9 C)  SpO2: 96%    Physical Exam Vitals signs reviewed.  Constitutional:      Appearance: Normal appearance.  HENT:     Head: Normocephalic and atraumatic.     Mouth/Throat:     Mouth: Mucous membranes are moist.     Pharynx: Oropharynx is clear.  Eyes:     Extraocular Movements: Extraocular movements intact.     Conjunctiva/sclera: Conjunctivae normal.     Pupils: Pupils are equal, round, and reactive to light.  Neck:     Musculoskeletal: Normal range of motion and neck supple.  Cardiovascular:     Rate and Rhythm: Normal rate and regular rhythm.     Pulses: Normal pulses.     Heart sounds: Normal heart sounds.  Pulmonary:     Effort: Pulmonary effort is normal.     Breath sounds: Normal breath sounds.  Abdominal:     General: Abdomen is flat. There is no distension.     Tenderness: There is no abdominal tenderness.  Musculoskeletal: Normal range of motion.  Skin:    General: Skin is warm and dry.  Neurological:     General: No focal deficit present.     Mental Status: She is oriented to person, place, and time.  Psychiatric:        Mood and Affect: Mood normal.        Behavior: Behavior normal.      Results for orders placed or performed in  visit on 01/14/18 (from the past 24 hour(s))  POCT glucose (manual entry)     Status: Abnormal   Collection Time: 01/14/18 11:18 AM  Result Value Ref Range   POC Glucose 115 (A) 70 - 99 mg/dl  POCT glycosylated hemoglobin (Hb A1C)     Status: Abnormal   Collection  Time: 01/14/18 11:24 AM  Result Value Ref Range   Hemoglobin A1C 6.6 (A) 4.0 - 5.6 %   HbA1c POC (<> result, manual entry)     HbA1c, POC (prediabetic range)     HbA1c, POC (controlled diabetic range)     Dg Chest 2 View  Result Date: 01/14/2018 CLINICAL DATA:  Previous abnormal chest x-ray. EXAM: CHEST - 2 VIEW COMPARISON:  None available FINDINGS: Linear densities in the lingula likely reflects scarring. Lungs otherwise clear. No effusions. Heart is normal size. No acute bony abnormality. IMPRESSION: Lingular scarring.  No active disease. Electronically Signed   By: Rolm Baptise M.D.   On: 01/14/2018 11:52   A total of 40 minutes was spent in the room with the patient, greater than 50% of which was in counseling/coordination of care regarding chronic medical conditions, treatment, medications, change in medications, review of blood work and chest x-ray, and need for follow-up.   ASSESSMENT & PLAN: Essential hypertension Uncontrolled blood pressure.  Continue amlodipine 5 mg.  Will add Cozaar 50 mg daily.  Follow-up in 6 months.  Type 2 diabetes mellitus without complication, without long-term current use of insulin (HCC) Hemoglobin A1c at 6.6.  Advised to increase metformin to 1000 mg twice a day.  [place] was seen today for hypertension.  Diagnoses and all orders for this visit:  Essential hypertension -     Discontinue: lisinopril (PRINIVIL,ZESTRIL) 10 MG tablet; Take 1 tablet (10 mg total) by mouth daily. -     Comprehensive metabolic panel  Need for prophylactic vaccination and inoculation against influenza -     Flu Vaccine QUAD 36+ mos IM  Hyperlipidemia, unspecified hyperlipidemia type -     Lipid panel  Type  2 diabetes mellitus without complication, without long-term current use of insulin (HCC) -     Comprehensive metabolic panel -     POCT glucose (manual entry) -     POCT glycosylated hemoglobin (Hb A1C) -     Lipid panel  Chronic joint pain -     Ambulatory referral to Rheumatology  Abnormal chest x-ray -     DG Chest 2 View; Future  Other orders -     metFORMIN (GLUCOPHAGE) 1000 MG tablet; Take 1 tablet (1,000 mg total) by mouth 2 (two) times daily with a meal. -     Discontinue: losartan (COZAAR) 50 MG tablet; Take 1 tablet (50 mg total) by mouth daily. -     Discontinue: losartan (COZAAR) 50 MG tablet; Take 1 tablet (50 mg total) by mouth daily. -     losartan (COZAAR) 50 MG tablet; Take 1 tablet (50 mg total) by mouth daily.    Patient Instructions       If you have lab work done today you will be contacted with your lab results within the next 2 weeks.  If you have not heard from [place] then please contact us. The fastest way to get your results is to register for My Chart.   IF you received an x-ray today, you will receive an invoice from [facility]. Please contact [facility] at [phone] with questions or concerns regarding your invoice.   IF you received labwork today, you will receive an invoice from [facility]. Please contact [facility] at [phone] with questions or concerns regarding your invoice.   Our billing staff will not be able to assist you with questions regarding bills from these companies.  You will be contacted with the lab results as soon as they  are available. The fastest way to get your results is to activate your My Chart account. Instructions are located on the last page of this paperwork. If you have not heard from [place] regarding the results in 2 weeks, please contact this office.     Diabetes Mellitus and Nutrition When you have diabetes (diabetes mellitus), it is very important to have healthy eating habits because your blood  sugar (glucose) levels are greatly affected by what you eat and drink. Eating healthy foods in the appropriate amounts, at about the same times every day, can help you:  Control your blood glucose.  Lower your risk of heart disease.  Improve your blood pressure.  Reach or maintain a healthy weight.  Every person with diabetes is different, and each person has different needs for a meal plan. Your health care provider may recommend that you work with a diet and nutrition specialist (dietitian) to make a meal plan that is best for you. Your meal plan may vary depending on factors such as:  The calories you need.  The medicines you take.  Your weight.  Your blood glucose, blood pressure, and cholesterol levels.  Your activity level.  Other health conditions you have, such as heart or kidney disease.  How do carbohydrates affect me? Carbohydrates affect your blood glucose level more than any other type of food. Eating carbohydrates naturally increases the amount of glucose in your blood. Carbohydrate counting is a method for keeping track of how many carbohydrates you eat. Counting carbohydrates is important to keep your blood glucose at a healthy level, especially if you use insulin or take certain oral diabetes medicines. It is important to know how many carbohydrates you can safely have in each meal. This is different for every person. Your dietitian can help you calculate how many carbohydrates you should have at each meal and for snack. Foods that contain carbohydrates include:  Bread, cereal, rice, pasta, and crackers.  Potatoes and corn.  Peas, beans, and lentils.  Milk and yogurt.  Fruit and juice.  Desserts, such as cakes, cookies, ice cream, and candy.  How does alcohol affect me? Alcohol can cause a sudden decrease in blood glucose (hypoglycemia), especially if you use insulin or take certain oral diabetes medicines. Hypoglycemia can be a life-threatening condition.  Symptoms of hypoglycemia (sleepiness, dizziness, and confusion) are similar to symptoms of having too much alcohol. If your health care provider says that alcohol is safe for you, follow these guidelines:  Limit alcohol intake to no more than 1 drink per day for nonpregnant women and 2 drinks per day for men. One drink equals 12 oz of beer, 5 oz of wine, or 1 oz of hard liquor.  Do not drink on an empty stomach.  Keep yourself hydrated with water, diet soda, or unsweetened iced tea.  Keep in mind that regular soda, juice, and other mixers may contain a lot of sugar and must be counted as carbohydrates.  What are tips for following this plan? Reading food labels  Start by checking the serving size on the label. The amount of calories, carbohydrates, fats, and other nutrients listed on the label are based on one serving of the food. Many foods contain more than one serving per package.  Check the total grams (g) of carbohydrates in one serving. You can calculate the number of servings of carbohydrates in one serving by dividing the total carbohydrates by 15. For example, if a food has 30 g of total carbohydrates, it  would be equal to 2 servings of carbohydrates.  Check the number of grams (g) of saturated and trans fats in one serving. Choose foods that have low or no amount of these fats.  Check the number of milligrams (mg) of sodium in one serving. Most people should limit total sodium intake to less than 2,300 mg per day.  Always check the nutrition information of foods labeled as "low-fat" or "nonfat". These foods may be higher in added sugar or refined carbohydrates and should be avoided.  Talk to your dietitian to identify your daily goals for nutrients listed on the label. Shopping  Avoid buying canned, premade, or processed foods. These foods tend to be high in fat, sodium, and added sugar.  Shop around the outside edge of the grocery store. This includes fresh fruits and  vegetables, bulk grains, fresh meats, and fresh dairy. Cooking  Use low-heat cooking methods, such as baking, instead of high-heat cooking methods like deep frying.  Cook using healthy oils, such as olive, canola, or sunflower oil.  Avoid cooking with butter, cream, or high-fat meats. Meal planning  Eat meals and snacks regularly, preferably at the same times every day. Avoid going long periods of time without eating.  Eat foods high in fiber, such as fresh fruits, vegetables, beans, and whole grains. Talk to your dietitian about how many servings of carbohydrates you can eat at each meal.  Eat 4-6 ounces of lean protein each day, such as lean meat, chicken, fish, eggs, or tofu. 1 ounce is equal to 1 ounce of meat, chicken, or fish, 1 egg, or 1/4 cup of tofu.  Eat some foods each day that contain healthy fats, such as avocado, nuts, seeds, and fish. Lifestyle   Check your blood glucose regularly.  Exercise at least 30 minutes 5 or more days each week, or as told by your health care provider.  Take medicines as told by your health care provider.  Do not use any products that contain nicotine or tobacco, such as cigarettes and e-cigarettes. If you need help quitting, ask your health care provider.  Work with a Social worker or diabetes educator to identify strategies to manage stress and any emotional and social challenges. What are some questions to ask my health care provider?  Do I need to meet with a diabetes educator?  Do I need to meet with a dietitian?  What number can I call if I have questions?  When are the best times to check my blood glucose? Where to find more information:  American Diabetes Association: diabetes.org/food-and-fitness/food  Academy of Nutrition and Dietetics: PokerClues.dk  [employer] of Diabetes and Digestive and Kidney Diseases (NIH):  ContactWire.be Summary  A healthy meal plan will help you control your blood glucose and maintain a healthy lifestyle.  Working with a diet and nutrition specialist (dietitian) can help you make a meal plan that is best for you.  Keep in mind that carbohydrates and alcohol have immediate effects on your blood glucose levels. It is important to count carbohydrates and to use alcohol carefully. This information is not intended to replace advice given to you by your health care provider. Make sure you discuss any questions you have with your health care provider. Document Released: 10/17/2004 Document Revised: 02/25/2016 Document Reviewed: 02/25/2016 Elsevier Interactive Patient Education  2018 [employer].  Hypertension Hypertension, commonly called high blood pressure, is when the force of blood pumping through the arteries is too strong. The arteries are the blood vessels that carry  blood from the heart throughout the body. Hypertension forces the heart to work harder to pump blood and may cause arteries to become narrow or stiff. Having untreated or uncontrolled hypertension can cause heart attacks, strokes, kidney disease, and other problems. A blood pressure reading consists of a higher number over a lower number. Ideally, your blood pressure should be below 120/80. The first ("top") number is called the systolic pressure. It is a measure of the pressure in your arteries as your heart beats. The second ("bottom") number is called the diastolic pressure. It is a measure of the pressure in your arteries as the heart relaxes. What are the causes? The cause of this condition is not known. What increases the risk? Some risk factors for high blood pressure are under your control. Others are not. Factors you can change  Smoking.  Having type 2 diabetes mellitus, high cholesterol, or both.  Not getting enough exercise or  physical activity.  Being overweight.  Having too much fat, sugar, calories, or salt (sodium) in your diet.  Drinking too much alcohol. Factors that are difficult or impossible to change  Having chronic kidney disease.  Having a family history of high blood pressure.  Age. Risk increases with age.  Race. You may be at higher risk if you are African-American.  Gender. Men are at higher risk than women before age 77. After age 35, women are at higher risk than men.  Having obstructive sleep apnea.  Stress. What are the signs or symptoms? Extremely high blood pressure (hypertensive crisis) may cause:  Headache.  Anxiety.  Shortness of breath.  Nosebleed.  Nausea and vomiting.  Severe chest pain.  Jerky movements you cannot control (seizures).  How is this diagnosed? This condition is diagnosed by measuring your blood pressure while you are seated, with your arm resting on a surface. The cuff of the blood pressure monitor will be placed directly against the skin of your upper arm at the level of your heart. It should be measured at least twice using the same arm. Certain conditions can cause a difference in blood pressure between your right and left arms. Certain factors can cause blood pressure readings to be lower or higher than normal (elevated) for a short period of time:  When your blood pressure is higher when you are in a health care provider's office than when you are at home, this is called white coat hypertension. Most people with this condition do not need medicines.  When your blood pressure is higher at home than when you are in a health care provider's office, this is called masked hypertension. Most people with this condition may need medicines to control blood pressure.  If you have a high blood pressure reading during one visit or you have normal blood pressure with other risk factors:  You may be asked to return on a different day to have your blood  pressure checked again.  You may be asked to monitor your blood pressure at home for 1 week or longer.  If you are diagnosed with hypertension, you may have other blood or imaging tests to help your health care provider understand your overall risk for other conditions. How is this treated? This condition is treated by making healthy lifestyle changes, such as eating healthy foods, exercising more, and reducing your alcohol intake. Your health care provider may prescribe medicine if lifestyle changes are not enough to get your blood pressure under control, and if:  Your systolic blood pressure is  above 130.  Your diastolic blood pressure is above 80.  Your personal target blood pressure may vary depending on your medical conditions, your age, and other factors. Follow these instructions at home: Eating and drinking  Eat a diet that is high in fiber and potassium, and low in sodium, added sugar, and fat. An example eating plan is called the DASH (Dietary Approaches to Stop Hypertension) diet. To eat this way: ? Eat plenty of fresh fruits and vegetables. Try to fill half of your plate at each meal with fruits and vegetables. ? Eat whole grains, such as whole wheat pasta, brown rice, or whole grain bread. Fill about one quarter of your plate with whole grains. ? Eat or drink low-fat dairy products, such as skim milk or low-fat yogurt. ? Avoid fatty cuts of meat, processed or cured meats, and poultry with skin. Fill about one quarter of your plate with lean proteins, such as fish, chicken without skin, beans, eggs, and tofu. ? Avoid premade and processed foods. These tend to be higher in sodium, added sugar, and fat.  Reduce your daily sodium intake. Most people with hypertension should eat less than 1,500 mg of sodium a day.  Limit alcohol intake to no more than 1 drink a day for nonpregnant women and 2 drinks a day for men. One drink equals 12 oz of beer, 5 oz of wine, or 1 oz of hard  liquor. Lifestyle  Work with your health care provider to maintain a healthy body weight or to lose weight. Ask what an ideal weight is for you.  Get at least 30 minutes of exercise that causes your heart to beat faster (aerobic exercise) most days of the week. Activities may include walking, swimming, or biking.  Include exercise to strengthen your muscles (resistance exercise), such as pilates or lifting weights, as part of your weekly exercise routine. Try to do these types of exercises for 30 minutes at least 3 days a week.  Do not use any products that contain nicotine or tobacco, such as cigarettes and e-cigarettes. If you need help quitting, ask your health care provider.  Monitor your blood pressure at home as told by your health care provider.  Keep all follow-up visits as told by your health care provider. This is important. Medicines  Take over-the-counter and prescription medicines only as told by your health care provider. Follow directions carefully. Blood pressure medicines must be taken as prescribed.  Do not skip doses of blood pressure medicine. Doing this puts you at risk for problems and can make the medicine less effective.  Ask your health care provider about side effects or reactions to medicines that you should watch for. Contact a health care provider if:  You think you are having a reaction to a medicine you are taking.  You have headaches that keep coming back (recurring).  You feel dizzy.  You have swelling in your ankles.  You have trouble with your vision. Get help right away if:  You develop a severe headache or confusion.  You have unusual weakness or numbness.  You feel faint.  You have severe pain in your chest or abdomen.  You vomit repeatedly.  You have trouble breathing. Summary  Hypertension is when the force of blood pumping through your arteries is too strong. If this condition is not controlled, it may put you at risk for serious  complications.  Your personal target blood pressure may vary depending on your medical conditions, your age, and other  factors. For most people, a normal blood pressure is less than 120/80.  Hypertension is treated with lifestyle changes, medicines, or a combination of both. Lifestyle changes include weight loss, eating a healthy, low-sodium diet, exercising more, and limiting alcohol. This information is not intended to replace advice given to you by your health care provider. Make sure you discuss any questions you have with your health care provider. Document Released: 01/20/2005 Document Revised: 12/19/2015 Document Reviewed: 12/19/2015 Elsevier Interactive Patient Education  2018 Elsevier Inc.      Agustina Caroli, MD [facility]

## 2018-01-14 NOTE — Assessment & Plan Note (Signed)
Hemoglobin A1c at 6.6.  Advised to increase metformin to 1000 mg twice a day.

## 2018-01-14 NOTE — Patient Instructions (Addendum)
   If you have lab work done today you will be contacted with your lab results within the next 2 weeks.  If you have not heard from us then please contact us. The fastest way to get your results is to register for My Chart.   IF you received an x-ray today, you will receive an invoice from [facility]. Please contact [facility] at [phone] with questions or concerns regarding your invoice.   IF you received labwork today, you will receive an invoice from [facility]. Please contact [facility] at [phone] with questions or concerns regarding your invoice.   Our billing staff will not be able to assist you with questions regarding bills from these companies.  You will be contacted with the lab results as soon as they are available. The fastest way to get your results is to activate your My Chart account. Instructions are located on the last page of this paperwork. If you have not heard from us regarding the results in 2 weeks, please contact this office.     Diabetes Mellitus and Nutrition When you have diabetes (diabetes mellitus), it is very important to have healthy eating habits because your blood sugar (glucose) levels are greatly affected by what you eat and drink. Eating healthy foods in the appropriate amounts, at about the same times every day, can help you:  Control your blood glucose.  Lower your risk of heart disease.  Improve your blood pressure.  Reach or maintain a healthy weight.  Every person with diabetes is different, and each person has different needs for a meal plan. Your health care provider may recommend that you work with a diet and nutrition specialist (dietitian) to make a meal plan that is best for you. Your meal plan may vary depending on factors such as:  The calories you need.  The medicines you take.  Your weight.  Your blood glucose, blood pressure, and cholesterol levels.  Your activity level.  Other health conditions you  have, such as heart or kidney disease.  How do carbohydrates affect me? Carbohydrates affect your blood glucose level more than any other type of food. Eating carbohydrates naturally increases the amount of glucose in your blood. Carbohydrate counting is a method for keeping track of how many carbohydrates you eat. Counting carbohydrates is important to keep your blood glucose at a healthy level, especially if you use insulin or take certain oral diabetes medicines. It is important to know how many carbohydrates you can safely have in each meal. This is different for every person. Your dietitian can help you calculate how many carbohydrates you should have at each meal and for snack. Foods that contain carbohydrates include:  Bread, cereal, rice, pasta, and crackers.  Potatoes and corn.  Peas, beans, and lentils.  Milk and yogurt.  Fruit and juice.  Desserts, such as cakes, cookies, ice cream, and candy.  How does alcohol affect me? Alcohol can cause a sudden decrease in blood glucose (hypoglycemia), especially if you use insulin or take certain oral diabetes medicines. Hypoglycemia can be a life-threatening condition. Symptoms of hypoglycemia (sleepiness, dizziness, and confusion) are similar to symptoms of having too much alcohol. If your health care provider says that alcohol is safe for you, follow these guidelines:  Limit alcohol intake to no more than 1 drink per day for nonpregnant women and 2 drinks per day for men. One drink equals 12 oz of beer, 5 oz of wine, or 1 oz of hard liquor.  Do   not drink on an empty stomach.  Keep yourself hydrated with water, diet soda, or unsweetened iced tea.  Keep in mind that regular soda, juice, and other mixers may contain a lot of sugar and must be counted as carbohydrates.  What are tips for following this plan? Reading food labels  Start by checking the serving size on the label. The amount of calories, carbohydrates, fats, and other  nutrients listed on the label are based on one serving of the food. Many foods contain more than one serving per package.  Check the total grams (g) of carbohydrates in one serving. You can calculate the number of servings of carbohydrates in one serving by dividing the total carbohydrates by 15. For example, if a food has 30 g of total carbohydrates, it would be equal to 2 servings of carbohydrates.  Check the number of grams (g) of saturated and trans fats in one serving. Choose foods that have low or no amount of these fats.  Check the number of milligrams (mg) of sodium in one serving. Most people should limit total sodium intake to less than 2,300 mg per day.  Always check the nutrition information of foods labeled as "low-fat" or "nonfat". These foods may be higher in added sugar or refined carbohydrates and should be avoided.  Talk to your dietitian to identify your daily goals for nutrients listed on the label. Shopping  Avoid buying canned, premade, or processed foods. These foods tend to be high in fat, sodium, and added sugar.  Shop around the outside edge of the grocery store. This includes fresh fruits and vegetables, bulk grains, fresh meats, and fresh dairy. Cooking  Use low-heat cooking methods, such as baking, instead of high-heat cooking methods like deep frying.  Cook using healthy oils, such as olive, canola, or sunflower oil.  Avoid cooking with butter, cream, or high-fat meats. Meal planning  Eat meals and snacks regularly, preferably at the same times every day. Avoid going long periods of time without eating.  Eat foods high in fiber, such as fresh fruits, vegetables, beans, and whole grains. Talk to your dietitian about how many servings of carbohydrates you can eat at each meal.  Eat 4-6 ounces of lean protein each day, such as lean meat, chicken, fish, eggs, or tofu. 1 ounce is equal to 1 ounce of meat, chicken, or fish, 1 egg, or 1/4 cup of tofu.  Eat some  foods each day that contain healthy fats, such as avocado, nuts, seeds, and fish. Lifestyle   Check your blood glucose regularly.  Exercise at least 30 minutes 5 or more days each week, or as told by your health care provider.  Take medicines as told by your health care provider.  Do not use any products that contain nicotine or tobacco, such as cigarettes and e-cigarettes. If you need help quitting, ask your health care provider.  Work with a counselor or diabetes educator to identify strategies to manage stress and any emotional and social challenges. What are some questions to ask my health care provider?  Do I need to meet with a diabetes educator?  Do I need to meet with a dietitian?  What number can I call if I have questions?  When are the best times to check my blood glucose? Where to find more information:  American Diabetes Association: diabetes.org/food-and-fitness/food  Academy of Nutrition and Dietetics: www.eatright.org/resources/health/diseases-and-conditions/diabetes  National Institute of Diabetes and Digestive and Kidney Diseases (NIH): www.niddk.nih.gov/health-information/diabetes/overview/diet-eating-physical-activity Summary  A healthy meal plan will   help you control your blood glucose and maintain a healthy lifestyle.  Working with a diet and nutrition specialist (dietitian) can help you make a meal plan that is best for you.  Keep in mind that carbohydrates and alcohol have immediate effects on your blood glucose levels. It is important to count carbohydrates and to use alcohol carefully. This information is not intended to replace advice given to you by your health care provider. Make sure you discuss any questions you have with your health care provider. Document Released: 10/17/2004 Document Revised: 02/25/2016 Document Reviewed: 02/25/2016 Elsevier Interactive Patient Education  2018 Elsevier Inc.  Hypertension Hypertension, commonly called high  blood pressure, is when the force of blood pumping through the arteries is too strong. The arteries are the blood vessels that carry blood from the heart throughout the body. Hypertension forces the heart to work harder to pump blood and may cause arteries to become narrow or stiff. Having untreated or uncontrolled hypertension can cause heart attacks, strokes, kidney disease, and other problems. A blood pressure reading consists of a higher number over a lower number. Ideally, your blood pressure should be below 120/80. The first ("top") number is called the systolic pressure. It is a measure of the pressure in your arteries as your heart beats. The second ("bottom") number is called the diastolic pressure. It is a measure of the pressure in your arteries as the heart relaxes. What are the causes? The cause of this condition is not known. What increases the risk? Some risk factors for high blood pressure are under your control. Others are not. Factors you can change  Smoking.  Having type 2 diabetes mellitus, high cholesterol, or both.  Not getting enough exercise or physical activity.  Being overweight.  Having too much fat, sugar, calories, or salt (sodium) in your diet.  Drinking too much alcohol. Factors that are difficult or impossible to change  Having chronic kidney disease.  Having a family history of high blood pressure.  Age. Risk increases with age.  Race. You may be at higher risk if you are African-American.  Gender. Men are at higher risk than women before age 45. After age 65, women are at higher risk than men.  Having obstructive sleep apnea.  Stress. What are the signs or symptoms? Extremely high blood pressure (hypertensive crisis) may cause:  Headache.  Anxiety.  Shortness of breath.  Nosebleed.  Nausea and vomiting.  Severe chest pain.  Jerky movements you cannot control (seizures).  How is this diagnosed? This condition is diagnosed by  measuring your blood pressure while you are seated, with your arm resting on a surface. The cuff of the blood pressure monitor will be placed directly against the skin of your upper arm at the level of your heart. It should be measured at least twice using the same arm. Certain conditions can cause a difference in blood pressure between your right and left arms. Certain factors can cause blood pressure readings to be lower or higher than normal (elevated) for a short period of time:  When your blood pressure is higher when you are in a health care provider's office than when you are at home, this is called white coat hypertension. Most people with this condition do not need medicines.  When your blood pressure is higher at home than when you are in a health care provider's office, this is called masked hypertension. Most people with this condition may need medicines to control blood pressure.  If you have   a high blood pressure reading during one visit or you have normal blood pressure with other risk factors:  You may be asked to return on a different day to have your blood pressure checked again.  You may be asked to monitor your blood pressure at home for 1 week or longer.  If you are diagnosed with hypertension, you may have other blood or imaging tests to help your health care provider understand your overall risk for other conditions. How is this treated? This condition is treated by making healthy lifestyle changes, such as eating healthy foods, exercising more, and reducing your alcohol intake. Your health care provider may prescribe medicine if lifestyle changes are not enough to get your blood pressure under control, and if:  Your systolic blood pressure is above 130.  Your diastolic blood pressure is above 80.  Your personal target blood pressure may vary depending on your medical conditions, your age, and other factors. Follow these instructions at home: Eating and drinking  Eat a  diet that is high in fiber and potassium, and low in sodium, added sugar, and fat. An example eating plan is called the DASH (Dietary Approaches to Stop Hypertension) diet. To eat this way: ? Eat plenty of fresh fruits and vegetables. Try to fill half of your plate at each meal with fruits and vegetables. ? Eat whole grains, such as whole wheat pasta, brown rice, or whole grain bread. Fill about one quarter of your plate with whole grains. ? Eat or drink low-fat dairy products, such as skim milk or low-fat yogurt. ? Avoid fatty cuts of meat, processed or cured meats, and poultry with skin. Fill about one quarter of your plate with lean proteins, such as fish, chicken without skin, beans, eggs, and tofu. ? Avoid premade and processed foods. These tend to be higher in sodium, added sugar, and fat.  Reduce your daily sodium intake. Most people with hypertension should eat less than 1,500 mg of sodium a day.  Limit alcohol intake to no more than 1 drink a day for nonpregnant women and 2 drinks a day for men. One drink equals 12 oz of beer, 5 oz of wine, or 1 oz of hard liquor. Lifestyle  Work with your health care provider to maintain a healthy body weight or to lose weight. Ask what an ideal weight is for you.  Get at least 30 minutes of exercise that causes your heart to beat faster (aerobic exercise) most days of the week. Activities may include walking, swimming, or biking.  Include exercise to strengthen your muscles (resistance exercise), such as pilates or lifting weights, as part of your weekly exercise routine. Try to do these types of exercises for 30 minutes at least 3 days a week.  Do not use any products that contain nicotine or tobacco, such as cigarettes and e-cigarettes. If you need help quitting, ask your health care provider.  Monitor your blood pressure at home as told by your health care provider.  Keep all follow-up visits as told by your health care provider. This is  important. Medicines  Take over-the-counter and prescription medicines only as told by your health care provider. Follow directions carefully. Blood pressure medicines must be taken as prescribed.  Do not skip doses of blood pressure medicine. Doing this puts you at risk for problems and can make the medicine less effective.  Ask your health care provider about side effects or reactions to medicines that you should watch for. Contact a health care   provider if:  You think you are having a reaction to a medicine you are taking.  You have headaches that keep coming back (recurring).  You feel dizzy.  You have swelling in your ankles.  You have trouble with your vision. Get help right away if:  You develop a severe headache or confusion.  You have unusual weakness or numbness.  You feel faint.  You have severe pain in your chest or abdomen.  You vomit repeatedly.  You have trouble breathing. Summary  Hypertension is when the force of blood pumping through your arteries is too strong. If this condition is not controlled, it may put you at risk for serious complications.  Your personal target blood pressure may vary depending on your medical conditions, your age, and other factors. For most people, a normal blood pressure is less than 120/80.  Hypertension is treated with lifestyle changes, medicines, or a combination of both. Lifestyle changes include weight loss, eating a healthy, low-sodium diet, exercising more, and limiting alcohol. This information is not intended to replace advice given to you by your health care provider. Make sure you discuss any questions you have with your health care provider. Document Released: 01/20/2005 Document Revised: 12/19/2015 Document Reviewed: 12/19/2015 Elsevier Interactive Patient Education  2018 Elsevier Inc.  

## 2018-01-15 ENCOUNTER — Encounter: Payer: Self-pay | Admitting: *Deleted

## 2018-01-15 ENCOUNTER — Ambulatory Visit: Payer: BLUE CROSS/BLUE SHIELD | Admitting: Emergency Medicine

## 2018-01-15 LAB — LIPID PANEL
Chol/HDL Ratio: 2.9 ratio (ref 0.0–4.4)
Cholesterol, Total: 154 mg/dL (ref 100–199)
HDL: 54 mg/dL (ref >39)
LDL CALC: 80 mg/dL (ref 0–99)
Triglycerides: 99 mg/dL (ref 0–149)
VLDL Cholesterol Cal: 20 mg/dL (ref 5–40)

## 2018-01-15 LAB — COMPREHENSIVE METABOLIC PANEL
ALT: 17 IU/L (ref 0–32)
AST: 13 IU/L (ref 0–40)
Albumin/Globulin Ratio: 1.5 (ref 1.2–2.2)
Albumin: 4.4 g/dL (ref 3.6–4.8)
Alkaline Phosphatase: 126 IU/L — ABNORMAL HIGH (ref 39–117)
BUN/Creatinine Ratio: 30 — ABNORMAL HIGH (ref 12–28)
BUN: 20 mg/dL (ref 8–27)
Bilirubin Total: 0.2 mg/dL (ref 0.0–1.2)
CALCIUM: 9.8 mg/dL (ref 8.7–10.3)
CO2: 26 mmol/L (ref 20–29)
Chloride: 100 mmol/L (ref 96–106)
Creatinine, Ser: 0.66 mg/dL (ref 0.57–1.00)
GFR calc non Af Amer: 96 mL/min/{1.73_m2} (ref >59)
GFR, EST AFRICAN AMERICAN: 110 mL/min/{1.73_m2} (ref >59)
Globulin, Total: 2.9 g/dL (ref 1.5–4.5)
Glucose: 113 mg/dL — ABNORMAL HIGH (ref 65–99)
Potassium: 4.8 mmol/L (ref 3.5–5.2)
Sodium: 139 mmol/L (ref 134–144)
TOTAL PROTEIN: 7.3 g/dL (ref 6.0–8.5)

## 2018-03-05 ENCOUNTER — Other Ambulatory Visit: Payer: Self-pay | Admitting: Emergency Medicine

## 2018-03-05 NOTE — Telephone Encounter (Signed)
Requested medication (s) are due for refill today: Not specified  Requested medication (s) are on the active medication list: yes    Last refill: 07/07/17  Future visit scheduled yes  07/14/2018  Notes to clinic:historical provider  Requested Prescriptions  Pending Prescriptions Disp Refills   Meloxicam 15 MG TBDP      Sig: Take by mouth daily.     Analgesics:  COX2 Inhibitors Passed - 03/05/2018 11:06 AM      Passed - HGB in normal range and within 360 days    Hemoglobin  Date Value Ref Range Status  07/17/2017 11.6 11.1 - 15.9 g/dL Final         Passed - Cr in normal range and within 360 days    Creatinine, Ser  Date Value Ref Range Status  01/14/2018 0.66 0.57 - 1.00 mg/dL Final         Passed - Patient is not pregnant      Passed - Valid encounter within last 12 months    Recent Outpatient Visits          1 month ago Essential hypertension   [facility] at [facility], Ines Bloomer, MD   7 months ago Preoperative clearance   [facility] at [facility], Ines Bloomer, MD   8 months ago Essential hypertension   [facility] at [facility], Ines Bloomer, MD      Future Appointments            In 4 months [place], Ines Bloomer, MD Primary Care at [place], [facility]

## 2018-03-05 NOTE — Telephone Encounter (Signed)
Copied from [place] [id number]. Topic: Quick Communication - Rx Refill/Question >> Mar 05, 2018 10:54 AM Percell Belt A wrote: Medication:  Meloxicam 15 MG TBDP [[id number]]  Diclofenac Potassium - she stated provider aware she was using this , she a lot left over from previous dr .  She is now needing a refill   Has the patient contacted their pharmacy? Yes  (Agent: If no, request that the patient contact the pharmacy for the refill.) (Agent: If yes, when and what did the pharmacy advise?)  Preferred Pharmacy (with phone number or street name): [facility] ([address] [place] [phone] (Phone)   Agent: Please be advised that RX refills may take up to 3 business days. We ask that you follow-up with your pharmacy.

## 2018-03-22 ENCOUNTER — Ambulatory Visit
Admission: EM | Admit: 2018-03-22 | Discharge: 2018-03-22 | Disposition: A | Discharge status: Home / Self Care | Payer: BLUE CROSS/BLUE SHIELD | Attending: Family Medicine | Admitting: Family Medicine

## 2018-03-22 ENCOUNTER — Encounter: Payer: Self-pay | Admitting: Emergency Medicine

## 2018-03-22 DIAGNOSIS — K047 Periapical abscess without sinus: Secondary | ICD-10-CM | POA: Diagnosis not present

## 2018-03-22 MED ORDER — AMOXICILLIN-POT CLAVULANATE 875-125 MG PO TABS: 1.0000 | ORAL_TABLET | Freq: Two times a day (BID) | ORAL | 0 refills | Status: AC

## 2018-03-22 MED ORDER — HYDROCODONE-ACETAMINOPHEN 5-325 MG PO TABS: 1.0000 | ORAL_TABLET | Freq: Four times a day (QID) | ORAL | 0 refills | Status: DC | PRN

## 2018-03-22 NOTE — Discharge Instructions (Signed)
Please follow up with dentistry  Today we have given you an antibiotic. This should help with pain as any infection is cleared.   For pain please take 600mg -800mg  of Ibuprofen every 8 hours, take with 1000 mg of Tylenol Extra strength every 8 hours. These are safe to take together. Please take with food.   I have also provided 2 days worth of stronger pain medication. This should only be used for severe pain. Do not drive or operate machinery while taking this medication.   Please return if you start to experience significant swelling of your face, experiencing fever.

## 2018-03-22 NOTE — ED Notes (Signed)
Patient able to ambulate independently  

## 2018-03-22 NOTE — ED Provider Notes (Signed)
[facility]    CSN: [id number] Arrival date & time: 03/22/18  1115     History   Chief Complaint Chief Complaint  Patient presents with  . Dental Pain    HPI Catherine Cline is a 63 y.o. female history of hypertension, asthma, DM type II, presenting today for evaluation of dental pain.  Patient states that over the past 2 days she has developed discomfort in her left lower jaw.  She tried taking some ibuprofen with minimal relief.  This morning when she woke up she noticed significant swelling around this area.  Notes that she has a fractured tooth in this area and notes that she has poor teeth as well.  Has not followed up with dentistry in a while.  Does have dental insurance.  Denies difficulty moving neck, denies fevers.  HPI  Past Medical History:  Diagnosis Date  . Arthritis   . Asthma   . Hypertension     Patient Active Problem List   Diagnosis Date Noted  . Type 2 diabetes mellitus without complication, without long-term current use of insulin ([place]) 01/14/2018  . Preoperative clearance 07/17/2017  . Essential hypertension 07/07/2017  . Hyperlipidemia 07/07/2017  . Breast cancer screening 07/07/2017    Past Surgical History:  Procedure Laterality Date  . OVARIAN CYST REMOVAL      OB History   No obstetric history on file.      Home Medications    Prior to Admission medications   Medication Sig Start Date End Date Taking? Authorizing Provider  amLODipine (NORVASC) 5 MG tablet Take 1 tablet (5 mg total) by mouth daily. 07/07/17  Yes Sagardia, Ines Bloomer, MD  atorvastatin (LIPITOR) 40 MG tablet Take 1 tablet (40 mg total) by mouth daily. 07/07/17  Yes Sagardia, Ines Bloomer, MD  Cholecalciferol (VITAMIN D3) 5000 units CAPS Take by mouth daily.   Yes [provider]  Flaxseed, Linseed, (FLAXSEED OIL PO) Take by mouth daily.   Yes [provider]  losartan (COZAAR) 50 MG tablet Take 1 tablet (50 mg total) by mouth daily. 01/14/18  04/14/18 Yes Runnells, Ines Bloomer, MD  metFORMIN (GLUCOPHAGE) 1000 MG tablet Take 1 tablet (1,000 mg total) by mouth 2 (two) times daily with a meal. 01/14/18  Yes Sagardia, Ines Bloomer, MD  Multiple Vitamin (MULTIVITAMIN) tablet Take 1 tablet by mouth daily.   Yes [provider]  amoxicillin-clavulanate (AUGMENTIN) 875-125 MG tablet Take 1 tablet by mouth every 12 (twelve) hours for 7 days. 03/22/18 03/29/18  Calle Schader C, PA-C  HYDROcodone-acetaminophen (NORCO/VICODIN) 5-325 MG tablet Take 1 tablet by mouth every 6 (six) hours as needed for severe pain. 03/22/18   Kalyn Dimattia C, PA-C  Meloxicam 15 MG TBDP Take by mouth daily.    [provider]    Family History Family History  Problem Relation Age of Onset  . Heart disease Mother   . Hypertension Mother   . Stroke Mother   . Diabetes Sister   . Hypertension Sister     Social History Social History   Tobacco Use  . Smoking status: Never Smoker  . Smokeless tobacco: Never Used  Substance Use Topics  . Alcohol use: Not Currently    Comment: socially  . Drug use: Never     Allergies   Patient has no known allergies.   Review of Systems Review of Systems  Constitutional: Negative for activity change, appetite change, chills, fatigue and fever.  HENT: Positive for dental problem and facial  swelling. Negative for congestion, ear pain, rhinorrhea, sinus pressure, sore throat and trouble swallowing.   Eyes: Negative for discharge and redness.  Respiratory: Negative for cough, chest tightness and shortness of breath.   Cardiovascular: Negative for chest pain.  Gastrointestinal: Negative for abdominal pain, diarrhea, nausea and vomiting.  Musculoskeletal: Negative for myalgias.  Skin: Negative for rash.  Neurological: Negative for dizziness, light-headedness and headaches.     Physical Exam Triage Vital Signs ED Triage Vitals  Enc Vitals Group     BP 03/22/18 1128 (!) 144/89     Pulse Rate  03/22/18 1128 90     Resp 03/22/18 1128 16     Temp 03/22/18 1128 98.2 F (36.8 C)     Temp Source 03/22/18 1128 Oral     SpO2 03/22/18 1128 95 %     Weight --      Height --      Head Circumference --      Peak Flow --      Pain Score 03/22/18 1129 9     Pain Loc --      Pain Edu? --      Excl. in [place]? --    No data found.  Updated Vital Signs BP (!) 144/89 (BP Location: Right Arm)   Pulse 90   Temp 98.2 F (36.8 C) (Oral)   Resp 16   SpO2 95%   Visual Acuity Right Eye Distance:   Left Eye Distance:   Bilateral Distance:    Right Eye Near:   Left Eye Near:    Bilateral Near:     Physical Exam Vitals signs and nursing note reviewed.  Constitutional:      General: She is not in acute distress.    Appearance: She is well-developed.  HENT:     Head: Normocephalic and atraumatic.     Comments: Left lower jaw with swelling and tenderness to palpation over this area    Mouth/Throat:     Comments: Overall poor dentition, no soft palate swelling, posterior pharynx patent  Left lower jaw with fractured tooth, surrounding gingival erythema swelling and tenderness Eyes:     Conjunctiva/sclera: Conjunctivae normal.  Neck:     Musculoskeletal: Neck supple.  Cardiovascular:     Rate and Rhythm: Normal rate and regular rhythm.     Heart sounds: No murmur.  Pulmonary:     Effort: Pulmonary effort is normal. No respiratory distress.     Breath sounds: Normal breath sounds.  Abdominal:     Palpations: Abdomen is soft.     Tenderness: There is no abdominal tenderness.  Skin:    General: Skin is warm and dry.  Neurological:     Mental Status: She is alert.      UC Treatments / Results  Labs (all labs ordered are listed, but only abnormal results are displayed) Labs Reviewed - No data to display  EKG None  Radiology No results found.  Procedures Procedures (including critical care time)  Medications Ordered in UC Medications - No data to display  Initial  Impression / Assessment and Plan / UC Course  I have reviewed the triage vital signs and the nursing notes.  Pertinent labs & imaging results that were available during my care of the patient were reviewed by me and considered in my medical decision making (see chart for details).     Patient with dental abscess, will initiate on Augmentin, Tylenol and ibuprofen for mild to moderate pain, did provide tramadol for  2 days to use for more severe pain/nighttime pain.  Discussed drowsiness regarding this.Discussed strict return precautions. Patient verbalized understanding and is agreeable with plan.  Final Clinical Impressions(s) / UC Diagnoses   Final diagnoses:  Dental abscess     Discharge Instructions     Please follow up with dentistry  Today we have given you an antibiotic. This should help with pain as any infection is cleared.   For pain please take 600mg -800mg  of Ibuprofen every 8 hours, take with 1000 mg of Tylenol Extra strength every 8 hours. These are safe to take together. Please take with food.   I have also provided 2 days worth of stronger pain medication. This should only be used for severe pain. Do not drive or operate machinery while taking this medication.   Please return if you start to experience significant swelling of your face, experiencing fever.   ED Prescriptions    Medication Sig Dispense Auth. Provider   amoxicillin-clavulanate (AUGMENTIN) 875-125 MG tablet Take 1 tablet by mouth every 12 (twelve) hours for 7 days. 14 tablet Aquanetta Schwarz C, PA-C   HYDROcodone-acetaminophen (NORCO/VICODIN) 5-325 MG tablet Take 1 tablet by mouth every 6 (six) hours as needed for severe pain. 8 tablet Shadi Larner, [place] C, PA-C     Controlled Substance Prescriptions [place] Controlled Substance Registry consulted? Not Applicable   Janith Lima, [place] 03/22/18 1329

## 2018-03-22 NOTE — ED Triage Notes (Signed)
Pt presents to [facility] for assessment of left jaw swelling with dental pain.

## 2018-05-23 ENCOUNTER — Other Ambulatory Visit: Payer: Self-pay | Admitting: Emergency Medicine

## 2018-05-25 ENCOUNTER — Other Ambulatory Visit: Payer: Self-pay | Admitting: Emergency Medicine

## 2018-05-25 NOTE — Telephone Encounter (Signed)
Requested medication (s) are due for refill today: yes  Requested medication (s) are on the active medication list: yes  Last refill:  05/25/2018 By PRINT  Future visit scheduled: yes  Notes to clinic: Per last appointment note 01/14/18 dose was changed. Please review.     Requested Prescriptions  Pending Prescriptions Disp Refills   metFORMIN (GLUCOPHAGE) 500 MG tablet [Pharmacy Med Name: metFORMIN HCl 500 MG Oral Tablet] 180 tablet 0    Sig: Take 1 tablet by mouth twice daily     Endocrinology:  Diabetes - Biguanides Passed - 05/25/2018  9:48 AM      Passed - Cr in normal range and within 360 days    Creatinine, Ser  Date Value Ref Range Status  01/14/2018 0.66 0.57 - 1.00 mg/dL Final         Passed - HBA1C is between 0 and 7.9 and within 180 days    Hemoglobin A1C  Date Value Ref Range Status  01/14/2018 6.6 (A) 4.0 - 5.6 % Final   Hgb A1c MFr Bld  Date Value Ref Range Status  07/17/2017 6.6 (H) 4.8 - 5.6 % Final    Comment:             Prediabetes: 5.7 - 6.4          Diabetes: >6.4          Glycemic control for adults with diabetes: <7.0          Passed - eGFR in normal range and within 360 days    GFR calc Af Amer  Date Value Ref Range Status  01/14/2018 110 >59 mL/min/1.73 Final   GFR calc non Af Amer  Date Value Ref Range Status  01/14/2018 96 >59 mL/min/1.73 Final         Passed - Valid encounter within last 6 months    Recent Outpatient Visits          4 months ago Essential hypertension   Primary Care at [facility], Ines Bloomer, MD   10 months ago Preoperative clearance   Primary Care at [facility], Ines Bloomer, MD   10 months ago Essential hypertension   Primary Care at [facility], Ines Bloomer, MD      Future Appointments            In 1 month Sagardia, Ines Bloomer, MD Primary Care at [place], [facility]

## 2018-05-26 ENCOUNTER — Other Ambulatory Visit: Payer: Self-pay | Admitting: Emergency Medicine

## 2018-05-26 ENCOUNTER — Other Ambulatory Visit: Payer: Self-pay

## 2018-05-26 ENCOUNTER — Telehealth: Payer: Self-pay | Admitting: Emergency Medicine

## 2018-05-26 MED ORDER — METFORMIN HCL 500 MG PO TABS: 500.0000 mg | ORAL_TABLET | Freq: Two times a day (BID) | ORAL | 0 refills | Status: DC

## 2018-05-26 NOTE — Telephone Encounter (Signed)
Refill 3 days ago was printed and not sent electronically. Called pt to verify. She stated she called a few days ago and requested the med. Refilling med electronically Requested Prescriptions  Pending Prescriptions Disp Refills  . metFORMIN (GLUCOPHAGE) 500 MG tablet [Pharmacy Med Name: metFORMIN HCl 500 MG Oral Tablet] 180 tablet 0    Sig: Take 1 tablet by mouth twice daily     Endocrinology:  Diabetes - Biguanides Passed - 05/26/2018  2:22 PM      Passed - Cr in normal range and within 360 days    Creatinine, Ser  Date Value Ref Range Status  01/14/2018 0.66 0.57 - 1.00 mg/dL Final         Passed - HBA1C is between 0 and 7.9 and within 180 days    Hemoglobin A1C  Date Value Ref Range Status  01/14/2018 6.6 (A) 4.0 - 5.6 % Final   Hgb A1c MFr Bld  Date Value Ref Range Status  07/17/2017 6.6 (H) 4.8 - 5.6 % Final    Comment:             Prediabetes: 5.7 - 6.4          Diabetes: >6.4          Glycemic control for adults with diabetes: <7.0          Passed - eGFR in normal range and within 360 days    GFR calc Af Amer  Date Value Ref Range Status  01/14/2018 110 >59 mL/min/1.73 Final   GFR calc non Af Amer  Date Value Ref Range Status  01/14/2018 96 >59 mL/min/1.73 Final         Passed - Valid encounter within last 6 months    Recent Outpatient Visits          4 months ago Essential hypertension   Primary Care at [facility], Ines Bloomer, MD   10 months ago Preoperative clearance   Primary Care at [facility], Ines Bloomer, MD   10 months ago Essential hypertension   Primary Care at [facility], Ines Bloomer, MD      Future Appointments            In 1 month Sagardia, Ines Bloomer, MD Primary Care at [place], [facility]

## 2018-05-26 NOTE — Telephone Encounter (Signed)
Electronic request was received for Metformin 500 mg tab. Noted 3 days ago, med a refilled but was printed. Attempted to send electronically today and again it printed. Called refill in to [facility] on file.

## 2018-07-14 ENCOUNTER — Other Ambulatory Visit: Payer: Self-pay

## 2018-07-14 ENCOUNTER — Ambulatory Visit (INDEPENDENT_AMBULATORY_CARE_PROVIDER_SITE_OTHER): Payer: BC Managed Care – PPO | Admitting: Emergency Medicine

## 2018-07-14 ENCOUNTER — Encounter: Payer: Self-pay | Admitting: Emergency Medicine

## 2018-07-14 VITALS — BP 163/95 | HR 79 | Temp 97.5°F | Resp 16 | Ht 62.5 in | Wt 209.4 lb

## 2018-07-14 DIAGNOSIS — I10 Essential (primary) hypertension: Secondary | ICD-10-CM

## 2018-07-14 DIAGNOSIS — Z23 Encounter for immunization: Secondary | ICD-10-CM | POA: Diagnosis not present

## 2018-07-14 DIAGNOSIS — M2550 Pain in unspecified joint: Secondary | ICD-10-CM | POA: Insufficient documentation

## 2018-07-14 DIAGNOSIS — G8929 Other chronic pain: Secondary | ICD-10-CM

## 2018-07-14 DIAGNOSIS — M150 Primary generalized (osteo)arthritis: Secondary | ICD-10-CM

## 2018-07-14 DIAGNOSIS — Z1211 Encounter for screening for malignant neoplasm of colon: Secondary | ICD-10-CM

## 2018-07-14 DIAGNOSIS — E785 Hyperlipidemia, unspecified: Secondary | ICD-10-CM

## 2018-07-14 DIAGNOSIS — E119 Type 2 diabetes mellitus without complications: Secondary | ICD-10-CM

## 2018-07-14 DIAGNOSIS — M159 Polyosteoarthritis, unspecified: Secondary | ICD-10-CM

## 2018-07-14 LAB — POCT GLYCOSYLATED HEMOGLOBIN (HGB A1C): Hemoglobin A1C: 6.1 % — AB (ref 4.0–5.6)

## 2018-07-14 LAB — LIPID PANEL

## 2018-07-14 LAB — GLUCOSE, POCT (MANUAL RESULT ENTRY): POC Glucose: 109 mg/dl — AB (ref 70–99)

## 2018-07-14 MED ORDER — MELOXICAM 15 MG PO TBDP: 1.0000 | ORAL_TABLET | Freq: Every day | ORAL | 1 refills | Status: DC

## 2018-07-14 NOTE — Progress Notes (Signed)
Lab Results  Component Value Date   HGBA1C 6.6 (A) 01/14/2018   BP Readings from Last 3 Encounters:  07/14/18 (!) 163/95  03/22/18 (!) 144/89  01/14/18 (!) 173/92   Lab Results  Component Value Date   CHOL 154 01/14/2018   HDL 54 01/14/2018   LDLCALC 80 01/14/2018   TRIG 99 01/14/2018   CHOLHDL 2.9 01/14/2018   The 10-year ASCVD risk score Mikey Bussing DC Jr., et al., 2013) is: 12.4%   Values used to calculate the score:     Age: 63 years     Sex: Female     Is Non-Hispanic African American: No     Diabetic: Yes     Tobacco smoker: No     Systolic Blood Pressure: 300 mmHg     Is BP treated: Yes     HDL Cholesterol: 54 mg/dL     Total Cholesterol: 154 mg/dL Wt Readings from Last 3 Encounters:  07/14/18 209 lb 6.4 oz (95 kg)  01/14/18 210 lb 12.8 oz (95.6 kg)  07/17/17 214 lb (97.1 kg)   Lab Results  Component Value Date   CREATININE 0.66 01/14/2018   BUN 20 01/14/2018   NA 139 01/14/2018   K 4.8 01/14/2018   CL 100 01/14/2018   CO2 26 01/14/2018   Catherine Cline 63 y.o.   Chief Complaint  Patient presents with   Diabetes    follow up 6 months   Medication Refill    ON ALL MEDICATIONS    HISTORY OF PRESENT ILLNESS: This is a 63 y.o. female with chronic medical problems here for follow-up.  Has the following medical problems: 1.  Chronic joint pain secondary to osteoarthritis.  Saw a rheumatologist earlier this year.  States she was not very helpful.  Daily pains interfering with her regular daily activities at work.  Works for [employer].  Considering disability options. 2.  Hypertension, taking amlodipine 5 and losartan 50 mg a day.  Did not take it this morning.  Blood pressures at home have been normal, 130s over 70s. 3.  Diabetes, on metformin 1000 mg twice a day.  Does not check blood sugars at home. 4.  Dyslipidemia, on Lipitor 40 mg a day.   HPI    Prior to Admission medications   Medication Sig Start Date End Date Taking? Authorizing Provider    amLODipine (NORVASC) 5 MG tablet Take 1 tablet (5 mg total) by mouth daily. 07/07/17  Yes Citlalli Weikel, Ines Bloomer, MD  atorvastatin (LIPITOR) 40 MG tablet Take 1 tablet (40 mg total) by mouth daily. 07/07/17  Yes Josalyn Dettmann, Ines Bloomer, MD  Cholecalciferol (VITAMIN D3) 5000 units CAPS Take by mouth daily.   Yes [provider]  metFORMIN (GLUCOPHAGE) 1000 MG tablet Take 1 tablet (1,000 mg total) by mouth 2 (two) times daily with a meal. 01/14/18  Yes Clemmie Buelna, Ines Bloomer, MD  Multiple Vitamin (MULTIVITAMIN) tablet Take 1 tablet by mouth daily.   Yes [provider]  Flaxseed, Linseed, (FLAXSEED OIL PO) Take by mouth daily.    [provider]  HYDROcodone-acetaminophen (NORCO/VICODIN) 5-325 MG tablet Take 1 tablet by mouth every 6 (six) hours as needed for severe pain. Patient not taking: Reported on 07/14/2018 03/22/18   Wieters, Hallie C, PA-C  losartan (COZAAR) 50 MG tablet Take 1 tablet (50 mg total) by mouth daily. 01/14/18 04/14/18  Horald Pollen, MD  Meloxicam 15 MG TBDP Take by mouth daily.    [provider]  metFORMIN (GLUCOPHAGE) 500  MG tablet Take 1 tablet (500 mg total) by mouth 2 (two) times daily. Patient not taking: Reported on 07/14/2018 05/26/18   Horald Pollen, MD    No Known Allergies  Patient Active Problem List   Diagnosis Date Noted   Type 2 diabetes mellitus without complication, without long-term current use of insulin ([place]) 01/14/2018   Essential hypertension 07/07/2017   Hyperlipidemia 07/07/2017    Past Medical History:  Diagnosis Date   Arthritis    Asthma    Hypertension     Past Surgical History:  Procedure Laterality Date   OVARIAN CYST REMOVAL      Social History   Socioeconomic History   Marital status: Single    Spouse name: Not on file   Number of children: Not on file   Years of education: Not on file   Highest education level: Not on file  Occupational History   Not on file  Social  Needs   Financial resource strain: Not on file   Food insecurity:    Worry: Not on file    Inability: Not on file   Transportation needs:    Medical: Not on file    Non-medical: Not on file  Tobacco Use   Smoking status: Never Smoker   Smokeless tobacco: Never Used  Substance and Sexual Activity   Alcohol use: Not Currently    Comment: socially   Drug use: Never   Sexual activity: Not on file  Lifestyle   Physical activity:    Days per week: Not on file    Minutes per session: Not on file   Stress: Not on file  Relationships   Social connections:    Talks on phone: Not on file    Gets together: Not on file    Attends religious service: Not on file    Active member of club or organization: Not on file    Attends meetings of clubs or organizations: Not on file    Relationship status: Not on file   Intimate partner violence:    Fear of current or ex partner: Not on file    Emotionally abused: Not on file    Physically abused: Not on file    Forced sexual activity: Not on file  Other Topics Concern   Not on file  Social History Narrative   Not on file    Family History  Problem Relation Age of Onset   Heart disease Mother    Hypertension Mother    Stroke Mother    Diabetes Sister    Hypertension Sister      Review of Systems  Constitutional: Negative.  Negative for chills, fever and weight loss.  HENT: Negative.  Negative for congestion and sore throat.   Eyes: Negative.   Respiratory: Negative.  Negative for cough and shortness of breath.   Cardiovascular: Negative.  Negative for chest pain and palpitations.  Gastrointestinal: Negative.  Negative for abdominal pain, blood in stool, diarrhea, melena, nausea and vomiting.  Genitourinary: Negative.  Negative for dysuria and hematuria.  Musculoskeletal: Positive for joint pain.  Skin: Negative.  Negative for rash.  Neurological: Negative.  Negative for dizziness and headaches.    Endo/Heme/Allergies: Negative.   All other systems reviewed and are negative.  Vitals:   07/14/18 0820  BP: (!) 163/95  Pulse: 79  Resp: 16  Temp: (!) 97.5 F (36.4 C)  SpO2: 95%     Physical Exam Vitals signs reviewed.  Constitutional:  Appearance: Normal appearance.  HENT:     Head: Normocephalic and atraumatic.  Eyes:     Extraocular Movements: Extraocular movements intact.     Conjunctiva/sclera: Conjunctivae normal.     Pupils: Pupils are equal, round, and reactive to light.  Neck:     Musculoskeletal: Normal range of motion and neck supple.  Cardiovascular:     Rate and Rhythm: Normal rate and regular rhythm.     Heart sounds: Normal heart sounds.  Pulmonary:     Effort: Pulmonary effort is normal.     Breath sounds: Normal breath sounds.  Musculoskeletal: Normal range of motion.        General: No swelling, tenderness or deformity.     Right lower leg: No edema.     Left lower leg: No edema.  Skin:    General: Skin is warm and dry.     Capillary Refill: Capillary refill takes less than 2 seconds.  Neurological:     General: No focal deficit present.     Mental Status: She is alert and oriented to person, place, and time.  Psychiatric:        Mood and Affect: Mood normal.        Behavior: Behavior normal.    Results for orders placed or performed in visit on 07/14/18 (from the past 24 hour(s))  POCT glucose (manual entry)     Status: Abnormal   Collection Time: 07/14/18  9:17 AM  Result Value Ref Range   POC Glucose 109 (A) 70 - 99 mg/dl  POCT glycosylated hemoglobin (Hb A1C)     Status: Abnormal   Collection Time: 07/14/18  9:17 AM  Result Value Ref Range   Hemoglobin A1C 6.1 (A) 4.0 - 5.6 %   HbA1c POC (<> result, manual entry)     HbA1c, POC (prediabetic range)     HbA1c, POC (controlled diabetic range)       ASSESSMENT & PLAN: Essential hypertension Blood pressure elevated today but she did not take her medication this morning.  Continue  amlodipine and losartan.  No changes.  Primary osteoarthritis involving multiple joints Has uncontrolled daily chronic joint pains.  Will start meloxicam.  Advised to follow-up with rheumatologist.  Type 2 diabetes mellitus without complication, without long-term current use of insulin ([place]) Well-controlled diabetes with hemoglobin A1c of 6.1.  Continue present treatment.  No changes.  Follow-up in 6 months.  Catherine Cline was seen today for diabetes and medication refill.  Diagnoses and all orders for this visit:  Type 2 diabetes mellitus without complication, without long-term current use of insulin ([facility]) -     POCT glucose (manual entry) -     POCT glycosylated hemoglobin (Hb A1C) -     CBC with Differential/Platelet -     Comprehensive metabolic panel -     Lipid panel -     HM Diabetes Foot Exam -     Ambulatory referral to Ophthalmology  Essential hypertension -     CBC with Differential/Platelet -     Comprehensive metabolic panel  Hyperlipidemia, unspecified hyperlipidemia type -     CBC with Differential/Platelet -     Comprehensive metabolic panel -     Lipid panel  Colon cancer screening -     Cologuard  Chronic joint pain -     Discontinue: Meloxicam 15 MG TBDP; Take 1 tablet by mouth daily. -     Meloxicam 15 MG TBDP; Take 1 tablet by mouth daily.  Primary osteoarthritis  involving multiple joints -     Discontinue: Meloxicam 15 MG TBDP; Take 1 tablet by mouth daily. -     Meloxicam 15 MG TBDP; Take 1 tablet by mouth daily.  Need for prophylactic vaccination against Streptococcus pneumoniae (pneumococcus) -     Pneumococcal polysaccharide vaccine 23-valent greater than or equal to 2yo subcutaneous/IM    Patient Instructions       If you have lab work done today you will be contacted with your lab results within the next 2 weeks.  If you have not heard from [place] then please contact us. The fastest way to get your results is to register for My Chart.   IF you  received an x-ray today, you will receive an invoice from [facility]. Please contact [facility] at [phone] with questions or concerns regarding your invoice.   IF you received labwork today, you will receive an invoice from [facility]. Please contact [facility] at [phone] with questions or concerns regarding your invoice.   Our billing staff will not be able to assist you with questions regarding bills from these companies.  You will be contacted with the lab results as soon as they are available. The fastest way to get your results is to activate your My Chart account. Instructions are located on the last page of this paperwork. If you have not heard from [place] regarding the results in 2 weeks, please contact this office.     Diabetes Mellitus and Nutrition, Adult When you have diabetes (diabetes mellitus), it is very important to have healthy eating habits because your blood sugar (glucose) levels are greatly affected by what you eat and drink. Eating healthy foods in the appropriate amounts, at about the same times every day, can help you:  Control your blood glucose.  Lower your risk of heart disease.  Improve your blood pressure.  Reach or maintain a healthy weight. Every person with diabetes is different, and each person has different needs for a meal plan. Your health care provider may recommend that you work with a diet and nutrition specialist (dietitian) to make a meal plan that is best for you. Your meal plan may vary depending on factors such as:  The calories you need.  The medicines you take.  Your weight.  Your blood glucose, blood pressure, and cholesterol levels.  Your activity level.  Other health conditions you have, such as heart or kidney disease. How do carbohydrates affect me? Carbohydrates, also called carbs, affect your blood glucose level more than any other type of food. Eating carbs naturally raises the amount of glucose in your  blood. Carb counting is a method for keeping track of how many carbs you eat. Counting carbs is important to keep your blood glucose at a healthy level, especially if you use insulin or take certain oral diabetes medicines. It is important to know how many carbs you can safely have in each meal. This is different for every person. Your dietitian can help you calculate how many carbs you should have at each meal and for each snack. Foods that contain carbs include:  Bread, cereal, rice, pasta, and crackers.  Potatoes and corn.  Peas, beans, and lentils.  Milk and yogurt.  Fruit and juice.  Desserts, such as cakes, cookies, ice cream, and candy. How does alcohol affect me? Alcohol can cause a sudden decrease in blood glucose (hypoglycemia), especially if you use insulin or take certain oral diabetes medicines. Hypoglycemia can be a life-threatening condition. Symptoms of  hypoglycemia (sleepiness, dizziness, and confusion) are similar to symptoms of having too much alcohol. If your health care provider says that alcohol is safe for you, follow these guidelines:  Limit alcohol intake to no more than 1 drink per day for nonpregnant women and 2 drinks per day for men. One drink equals 12 oz of beer, 5 oz of wine, or 1 oz of hard liquor.  Do not drink on an empty stomach.  Keep yourself hydrated with water, diet soda, or unsweetened iced tea.  Keep in mind that regular soda, juice, and other mixers may contain a lot of sugar and must be counted as carbs. What are tips for following this plan?  Reading food labels  Start by checking the serving size on the "Nutrition Facts" label of packaged foods and drinks. The amount of calories, carbs, fats, and other nutrients listed on the label is based on one serving of the item. Many items contain more than one serving per package.  Check the total grams (g) of carbs in one serving. You can calculate the number of servings of carbs in one serving by  dividing the total carbs by 15. For example, if a food has 30 g of total carbs, it would be equal to 2 servings of carbs.  Check the number of grams (g) of saturated and trans fats in one serving. Choose foods that have low or no amount of these fats.  Check the number of milligrams (mg) of salt (sodium) in one serving. Most people should limit total sodium intake to less than 2,300 mg per day.  Always check the nutrition information of foods labeled as "low-fat" or "nonfat". These foods may be higher in added sugar or refined carbs and should be avoided.  Talk to your dietitian to identify your daily goals for nutrients listed on the label. Shopping  Avoid buying canned, premade, or processed foods. These foods tend to be high in fat, sodium, and added sugar.  Shop around the outside edge of the grocery store. This includes fresh fruits and vegetables, bulk grains, fresh meats, and fresh dairy. Cooking  Use low-heat cooking methods, such as baking, instead of high-heat cooking methods like deep frying.  Cook using healthy oils, such as olive, canola, or sunflower oil.  Avoid cooking with butter, cream, or high-fat meats. Meal planning  Eat meals and snacks regularly, preferably at the same times every day. Avoid going long periods of time without eating.  Eat foods high in fiber, such as fresh fruits, vegetables, beans, and whole grains. Talk to your dietitian about how many servings of carbs you can eat at each meal.  Eat 4-6 ounces (oz) of lean protein each day, such as lean meat, chicken, fish, eggs, or tofu. One oz of lean protein is equal to: ? 1 oz of meat, chicken, or fish. ? 1 egg. ?  cup of tofu.  Eat some foods each day that contain healthy fats, such as avocado, nuts, seeds, and fish. Lifestyle  Check your blood glucose regularly.  Exercise regularly as told by your health care provider. This may include: ? 150 minutes of moderate-intensity or vigorous-intensity  exercise each week. This could be brisk walking, biking, or water aerobics. ? Stretching and doing strength exercises, such as yoga or weightlifting, at least 2 times a week.  Take medicines as told by your health care provider.  Do not use any products that contain nicotine or tobacco, such as cigarettes and e-cigarettes. If you need help  quitting, ask your health care provider.  Work with a Social worker or diabetes educator to identify strategies to manage stress and any emotional and social challenges. Questions to ask a health care provider  Do I need to meet with a diabetes educator?  Do I need to meet with a dietitian?  What number can I call if I have questions?  When are the best times to check my blood glucose? Where to find more information:  American Diabetes Association: diabetes.org  Academy of Nutrition and Dietetics: www.eatright.[employer] of Diabetes and Digestive and Kidney Diseases (NIH): [employer] Summary  A healthy meal plan will help you control your blood glucose and maintain a healthy lifestyle.  Working with a diet and nutrition specialist (dietitian) can help you make a meal plan that is best for you.  Keep in mind that carbohydrates (carbs) and alcohol have immediate effects on your blood glucose levels. It is important to count carbs and to use alcohol carefully. This information is not intended to replace advice given to you by your health care provider. Make sure you discuss any questions you have with your health care provider. Document Released: 10/17/2004 Document Revised: 08/20/2016 Document Reviewed: 02/25/2016 Elsevier Interactive Patient Education  2019 Elsevier Inc.      Agustina Caroli, MD [facility]

## 2018-07-14 NOTE — Assessment & Plan Note (Signed)
Well-controlled diabetes with hemoglobin A1c of 6.1.  Continue present treatment.  No changes.  Follow-up in 6 months.

## 2018-07-14 NOTE — Assessment & Plan Note (Signed)
Blood pressure elevated today but she did not take her medication this morning.  Continue amlodipine and losartan.  No changes.

## 2018-07-14 NOTE — Patient Instructions (Addendum)
   If you have lab work done today you will be contacted with your lab results within the next 2 weeks.  If you have not heard from us then please contact us. The fastest way to get your results is to register for My Chart.   IF you received an x-ray today, you will receive an invoice from [facility]. Please contact  Radiology at [phone] with questions or concerns regarding your invoice.   IF you received labwork today, you will receive an invoice from [facility]. Please contact [facility] at [phone] with questions or concerns regarding your invoice.   Our billing staff will not be able to assist you with questions regarding bills from these companies.  You will be contacted with the lab results as soon as they are available. The fastest way to get your results is to activate your My Chart account. Instructions are located on the last page of this paperwork. If you have not heard from us regarding the results in 2 weeks, please contact this office.     Diabetes Mellitus and Nutrition, Adult When you have diabetes (diabetes mellitus), it is very important to have healthy eating habits because your blood sugar (glucose) levels are greatly affected by what you eat and drink. Eating healthy foods in the appropriate amounts, at about the same times every day, can help you:  Control your blood glucose.  Lower your risk of heart disease.  Improve your blood pressure.  Reach or maintain a healthy weight. Every person with diabetes is different, and each person has different needs for a meal plan. Your health care provider may recommend that you work with a diet and nutrition specialist (dietitian) to make a meal plan that is best for you. Your meal plan may vary depending on factors such as:  The calories you need.  The medicines you take.  Your weight.  Your blood glucose, blood pressure, and cholesterol levels.  Your activity level.  Other health conditions  you have, such as heart or kidney disease. How do carbohydrates affect me? Carbohydrates, also called carbs, affect your blood glucose level more than any other type of food. Eating carbs naturally raises the amount of glucose in your blood. Carb counting is a method for keeping track of how many carbs you eat. Counting carbs is important to keep your blood glucose at a healthy level, especially if you use insulin or take certain oral diabetes medicines. It is important to know how many carbs you can safely have in each meal. This is different for every person. Your dietitian can help you calculate how many carbs you should have at each meal and for each snack. Foods that contain carbs include:  Bread, cereal, rice, pasta, and crackers.  Potatoes and corn.  Peas, beans, and lentils.  Milk and yogurt.  Fruit and juice.  Desserts, such as cakes, cookies, ice cream, and candy. How does alcohol affect me? Alcohol can cause a sudden decrease in blood glucose (hypoglycemia), especially if you use insulin or take certain oral diabetes medicines. Hypoglycemia can be a life-threatening condition. Symptoms of hypoglycemia (sleepiness, dizziness, and confusion) are similar to symptoms of having too much alcohol. If your health care provider says that alcohol is safe for you, follow these guidelines:  Limit alcohol intake to no more than 1 drink per day for nonpregnant women and 2 drinks per day for men. One drink equals 12 oz of beer, 5 oz of wine, or 1 oz of hard liquor.    Do not drink on an empty stomach.  Keep yourself hydrated with water, diet soda, or unsweetened iced tea.  Keep in mind that regular soda, juice, and other mixers may contain a lot of sugar and must be counted as carbs. What are tips for following this plan?  Reading food labels  Start by checking the serving size on the "Nutrition Facts" label of packaged foods and drinks. The amount of calories, carbs, fats, and other  nutrients listed on the label is based on one serving of the item. Many items contain more than one serving per package.  Check the total grams (g) of carbs in one serving. You can calculate the number of servings of carbs in one serving by dividing the total carbs by 15. For example, if a food has 30 g of total carbs, it would be equal to 2 servings of carbs.  Check the number of grams (g) of saturated and trans fats in one serving. Choose foods that have low or no amount of these fats.  Check the number of milligrams (mg) of salt (sodium) in one serving. Most people should limit total sodium intake to less than 2,300 mg per day.  Always check the nutrition information of foods labeled as "low-fat" or "nonfat". These foods may be higher in added sugar or refined carbs and should be avoided.  Talk to your dietitian to identify your daily goals for nutrients listed on the label. Shopping  Avoid buying canned, premade, or processed foods. These foods tend to be high in fat, sodium, and added sugar.  Shop around the outside edge of the grocery store. This includes fresh fruits and vegetables, bulk grains, fresh meats, and fresh dairy. Cooking  Use low-heat cooking methods, such as baking, instead of high-heat cooking methods like deep frying.  Cook using healthy oils, such as olive, canola, or sunflower oil.  Avoid cooking with butter, cream, or high-fat meats. Meal planning  Eat meals and snacks regularly, preferably at the same times every day. Avoid going long periods of time without eating.  Eat foods high in fiber, such as fresh fruits, vegetables, beans, and whole grains. Talk to your dietitian about how many servings of carbs you can eat at each meal.  Eat 4-6 ounces (oz) of lean protein each day, such as lean meat, chicken, fish, eggs, or tofu. One oz of lean protein is equal to: ? 1 oz of meat, chicken, or fish. ? 1 egg. ?  cup of tofu.  Eat some foods each day that contain  healthy fats, such as avocado, nuts, seeds, and fish. Lifestyle  Check your blood glucose regularly.  Exercise regularly as told by your health care provider. This may include: ? 150 minutes of moderate-intensity or vigorous-intensity exercise each week. This could be brisk walking, biking, or water aerobics. ? Stretching and doing strength exercises, such as yoga or weightlifting, at least 2 times a week.  Take medicines as told by your health care provider.  Do not use any products that contain nicotine or tobacco, such as cigarettes and e-cigarettes. If you need help quitting, ask your health care provider.  Work with a counselor or diabetes educator to identify strategies to manage stress and any emotional and social challenges. Questions to ask a health care provider  Do I need to meet with a diabetes educator?  Do I need to meet with a dietitian?  What number can I call if I have questions?  When are the best times to   check my blood glucose? Where to find more information:  American Diabetes Association: diabetes.org  Academy of Nutrition and Dietetics: www.eatright.org  National Institute of Diabetes and Digestive and Kidney Diseases (NIH): www.niddk.nih.gov Summary  A healthy meal plan will help you control your blood glucose and maintain a healthy lifestyle.  Working with a diet and nutrition specialist (dietitian) can help you make a meal plan that is best for you.  Keep in mind that carbohydrates (carbs) and alcohol have immediate effects on your blood glucose levels. It is important to count carbs and to use alcohol carefully. This information is not intended to replace advice given to you by your health care provider. Make sure you discuss any questions you have with your health care provider. Document Released: 10/17/2004 Document Revised: 08/20/2016 Document Reviewed: 02/25/2016 Elsevier Interactive Patient Education  2019 [employer]vier Inc.  

## 2018-07-14 NOTE — Assessment & Plan Note (Signed)
Has uncontrolled daily chronic joint pains.  Will start meloxicam.  Advised to follow-up with rheumatologist.

## 2018-07-15 ENCOUNTER — Encounter: Payer: Self-pay | Admitting: Emergency Medicine

## 2018-07-15 LAB — COMPREHENSIVE METABOLIC PANEL
ALT: 19 IU/L (ref 0–32)
AST: 15 IU/L (ref 0–40)
Albumin/Globulin Ratio: 1.5 (ref 1.2–2.2)
Albumin: 4.4 g/dL (ref 3.8–4.8)
Alkaline Phosphatase: 87 IU/L (ref 39–117)
BUN/Creatinine Ratio: 35 — ABNORMAL HIGH (ref 12–28)
BUN: 22 mg/dL (ref 8–27)
Bilirubin Total: 0.2 mg/dL (ref 0.0–1.2)
CO2: 24 mmol/L (ref 20–29)
Calcium: 10 mg/dL (ref 8.7–10.3)
Chloride: 100 mmol/L (ref 96–106)
Creatinine, Ser: 0.62 mg/dL (ref 0.57–1.00)
GFR calc Af Amer: 113 mL/min/{1.73_m2} (ref >59)
GFR calc non Af Amer: 98 mL/min/{1.73_m2} (ref >59)
Globulin, Total: 2.9 g/dL (ref 1.5–4.5)
Glucose: 91 mg/dL (ref 65–99)
Potassium: 4.4 mmol/L (ref 3.5–5.2)
Sodium: 139 mmol/L (ref 134–144)
Total Protein: 7.3 g/dL (ref 6.0–8.5)

## 2018-07-15 LAB — CBC WITH DIFFERENTIAL/PLATELET
Basophils Absolute: 0 10*3/uL (ref 0.0–0.2)
Basos: 0 %
EOS (ABSOLUTE): 0.1 10*3/uL (ref 0.0–0.4)
Eos: 2 %
Hematocrit: 40.5 % (ref 34.0–46.6)
Hemoglobin: 11.9 g/dL (ref 11.1–15.9)
Immature Grans (Abs): 0 10*3/uL (ref 0.0–0.1)
Immature Granulocytes: 0 %
Lymphocytes Absolute: 1.5 10*3/uL (ref 0.7–3.1)
Lymphs: 23 %
MCH: 22.9 pg — ABNORMAL LOW (ref 26.6–33.0)
MCHC: 29.4 g/dL — ABNORMAL LOW (ref 31.5–35.7)
MCV: 78 fL — ABNORMAL LOW (ref 79–97)
Monocytes Absolute: 0.3 10*3/uL (ref 0.1–0.9)
Monocytes: 5 %
Neutrophils Absolute: 4.6 10*3/uL (ref 1.4–7.0)
Neutrophils: 70 %
Platelets: 307 10*3/uL (ref 150–450)
RBC: 5.2 x10E6/uL (ref 3.77–5.28)
RDW: 15 % (ref 11.7–15.4)
WBC: 6.5 10*3/uL (ref 3.4–10.8)

## 2018-07-15 LAB — LIPID PANEL
Chol/HDL Ratio: 2.7 ratio (ref 0.0–4.4)
Cholesterol, Total: 147 mg/dL (ref 100–199)
HDL: 55 mg/dL (ref >39)
LDL Calculated: 77 mg/dL (ref 0–99)
Triglycerides: 74 mg/dL (ref 0–149)
VLDL Cholesterol Cal: 15 mg/dL (ref 5–40)

## 2018-07-17 ENCOUNTER — Telehealth: Payer: Self-pay | Admitting: Emergency Medicine

## 2018-07-19 ENCOUNTER — Other Ambulatory Visit: Payer: Self-pay | Admitting: Emergency Medicine

## 2018-07-19 NOTE — Telephone Encounter (Signed)
Patient calling to check status of refill request.

## 2018-07-19 NOTE — Telephone Encounter (Signed)
Copied from [place] [phone]. Topic: Quick Communication - Rx Refill/Question >> Jul 19, 2018  8:37 AM Catherine Cline wrote: Medication: Meloxicam 15 MG TBDP   Per patient, pharmacy never received this RX. Patient is out of medication.    Preferred Pharmacy (with phone number or street name):[facility] ([address] [place] [phone] (Phone) [phone] (Fax)

## 2018-07-19 NOTE — Telephone Encounter (Signed)
PT CALLED AGAIN ON THIS FOR THE 2ND TIME, THIS SCRIPT LOOKS TO HAVE BEEN WRITTEN BUT AS OF THIS AFTERNOON THE PHARMACY HAS NOT RECEIVED. PLEASE RESEND TO [facility]

## 2018-07-21 ENCOUNTER — Other Ambulatory Visit: Payer: Self-pay | Admitting: Emergency Medicine

## 2018-07-21 NOTE — Telephone Encounter (Signed)
Medications atorvastatin, metformin and amlodipine sent over to pharmacy on 07/19/2018.

## 2018-07-21 NOTE — Telephone Encounter (Signed)
Patient says [facility] did not receive these 3 scripts and to please resend.   Metformin Atorvastatin amlodipine

## 2018-07-22 ENCOUNTER — Other Ambulatory Visit: Payer: Self-pay

## 2018-07-22 DIAGNOSIS — M159 Polyosteoarthritis, unspecified: Secondary | ICD-10-CM

## 2018-07-22 DIAGNOSIS — G8929 Other chronic pain: Secondary | ICD-10-CM

## 2018-07-22 MED ORDER — MELOXICAM 15 MG PO TBDP: 1.0000 | ORAL_TABLET | Freq: Every day | ORAL | 1 refills | Status: DC

## 2018-07-23 NOTE — Telephone Encounter (Signed)
I have called the pt and there was no answer. I wanted to let her know that the medication has been at the pharmacy since 07/19/2018. I have called [facility] and confirmed.   It is ready for pick up whenever it is ready.

## 2018-07-26 LAB — COLOGUARD: Cologuard: NEGATIVE

## 2018-07-28 NOTE — Telephone Encounter (Signed)
Patient is calling stating the pharmacy has no prescriptions sent to the pharmacy. Patient now is stating she has no medications. atorvastatin Metformin Amlodipine Call back # [phone]

## 2018-07-29 ENCOUNTER — Other Ambulatory Visit: Payer: Self-pay

## 2018-07-29 DIAGNOSIS — I10 Essential (primary) hypertension: Secondary | ICD-10-CM

## 2018-07-29 DIAGNOSIS — E785 Hyperlipidemia, unspecified: Secondary | ICD-10-CM

## 2018-07-29 MED ORDER — AMLODIPINE BESYLATE 5 MG PO TABS: 5.0000 mg | ORAL_TABLET | Freq: Every day | ORAL | 1 refills | Status: DC

## 2018-07-29 MED ORDER — METFORMIN HCL 1000 MG PO TABS: 1000.0000 mg | ORAL_TABLET | Freq: Two times a day (BID) | ORAL | 1 refills | Status: DC

## 2018-07-29 MED ORDER — ATORVASTATIN CALCIUM 40 MG PO TABS: 40.0000 mg | ORAL_TABLET | Freq: Every day | ORAL | 1 refills | Status: DC

## 2018-08-12 ENCOUNTER — Telehealth: Payer: Self-pay | Admitting: Emergency Medicine

## 2018-08-12 NOTE — Telephone Encounter (Signed)
Copied from [place] [id number]. Topic: General - Inquiry >> Aug 12, 2018  9:06 AM [name] wrote: Reason for CRM: Pt called to follow up and see if Dr. Mitchel Honour has received paperwork from her rheumatologist regarding disability. Would like a callback to let her know if they have or have not been received. Ok to leave vm

## 2018-08-16 NOTE — Telephone Encounter (Signed)
I have not seen this paperwork.  Thanks.

## 2018-08-19 NOTE — Telephone Encounter (Signed)
Patient will call to have the paperwork recent

## 2018-09-02 ENCOUNTER — Encounter: Payer: Self-pay | Admitting: Emergency Medicine

## 2018-09-02 ENCOUNTER — Telehealth: Payer: Self-pay | Admitting: *Deleted

## 2018-09-02 NOTE — Telephone Encounter (Signed)
Left message in voice mail of mobile phone for patient to call rheumatology office to have the disability forms faxed to our office. This office did not get the forms.

## 2018-09-02 NOTE — Telephone Encounter (Signed)
Left message in voice mail to call Rheumatology office to have forms faxed to the office.

## 2018-09-07 ENCOUNTER — Encounter: Payer: Self-pay | Admitting: Emergency Medicine

## 2018-09-08 ENCOUNTER — Telehealth: Payer: Self-pay | Admitting: Emergency Medicine

## 2018-09-08 NOTE — Telephone Encounter (Signed)
Thanks

## 2018-09-08 NOTE — Telephone Encounter (Signed)
Pt dropped off records on a thumb drive from [place]. I put it in your box.

## 2018-09-15 LAB — HM DIABETES EYE EXAM

## 2018-12-08 ENCOUNTER — Encounter: Payer: Self-pay | Admitting: Emergency Medicine

## 2018-12-09 ENCOUNTER — Encounter: Payer: Self-pay | Admitting: Emergency Medicine

## 2019-01-12 ENCOUNTER — Encounter: Payer: Self-pay | Admitting: Emergency Medicine

## 2019-01-12 ENCOUNTER — Encounter: Payer: Self-pay | Admitting: *Deleted

## 2019-01-12 ENCOUNTER — Other Ambulatory Visit: Payer: Self-pay

## 2019-01-12 ENCOUNTER — Ambulatory Visit (INDEPENDENT_AMBULATORY_CARE_PROVIDER_SITE_OTHER): Payer: BC Managed Care – PPO | Admitting: Emergency Medicine

## 2019-01-12 VITALS — BP 183/114 | HR 85 | Temp 98.8°F | Resp 16 | Ht 62.0 in | Wt 215.0 lb

## 2019-01-12 DIAGNOSIS — E1169 Type 2 diabetes mellitus with other specified complication: Secondary | ICD-10-CM

## 2019-01-12 DIAGNOSIS — M8949 Other hypertrophic osteoarthropathy, multiple sites: Secondary | ICD-10-CM

## 2019-01-12 DIAGNOSIS — E785 Hyperlipidemia, unspecified: Secondary | ICD-10-CM | POA: Diagnosis not present

## 2019-01-12 DIAGNOSIS — M2550 Pain in unspecified joint: Secondary | ICD-10-CM

## 2019-01-12 DIAGNOSIS — E1159 Type 2 diabetes mellitus with other circulatory complications: Secondary | ICD-10-CM | POA: Diagnosis not present

## 2019-01-12 DIAGNOSIS — G8929 Other chronic pain: Secondary | ICD-10-CM

## 2019-01-12 DIAGNOSIS — I10 Essential (primary) hypertension: Secondary | ICD-10-CM

## 2019-01-12 DIAGNOSIS — M159 Polyosteoarthritis, unspecified: Secondary | ICD-10-CM

## 2019-01-12 DIAGNOSIS — Z1231 Encounter for screening mammogram for malignant neoplasm of breast: Secondary | ICD-10-CM

## 2019-01-12 LAB — POCT GLYCOSYLATED HEMOGLOBIN (HGB A1C): Hemoglobin A1C: 6 % — AB (ref 4.0–5.6)

## 2019-01-12 LAB — GLUCOSE, POCT (MANUAL RESULT ENTRY): POC Glucose: 114 mg/dl — AB (ref 70–99)

## 2019-01-12 MED ORDER — METFORMIN HCL 1000 MG PO TABS: 1000.0000 mg | ORAL_TABLET | Freq: Two times a day (BID) | ORAL | 3 refills | Status: DC

## 2019-01-12 MED ORDER — LOSARTAN POTASSIUM 100 MG PO TABS: 100.0000 mg | ORAL_TABLET | Freq: Every day | ORAL | 3 refills | Status: DC

## 2019-01-12 MED ORDER — AMLODIPINE BESYLATE 5 MG PO TABS: 5.0000 mg | ORAL_TABLET | Freq: Every day | ORAL | 3 refills | Status: DC

## 2019-01-12 MED ORDER — ATORVASTATIN CALCIUM 40 MG PO TABS: 40.0000 mg | ORAL_TABLET | Freq: Every day | ORAL | 3 refills | Status: DC

## 2019-01-12 MED ORDER — ALBUTEROL SULFATE HFA 108 (90 BASE) MCG/ACT IN AERS: 2.0000 | INHALATION_SPRAY | Freq: Four times a day (QID) | RESPIRATORY_TRACT | 3 refills | Status: DC | PRN

## 2019-01-12 NOTE — Patient Instructions (Addendum)
   If you have lab work done today you will be contacted with your lab results within the next 2 weeks.  If you have not heard from us then please contact us. The fastest way to get your results is to register for My Chart.   IF you received an x-ray today, you will receive an invoice from [facility]. Please contact  Radiology at [phone] with questions or concerns regarding your invoice.   IF you received labwork today, you will receive an invoice from [facility]. Please contact [facility] at [phone] with questions or concerns regarding your invoice.   Our billing staff will not be able to assist you with questions regarding bills from these companies.  You will be contacted with the lab results as soon as they are available. The fastest way to get your results is to activate your My Chart account. Instructions are located on the last page of this paperwork. If you have not heard from us regarding the results in 2 weeks, please contact this office.     Diabetes Mellitus and Nutrition, Adult When you have diabetes (diabetes mellitus), it is very important to have healthy eating habits because your blood sugar (glucose) levels are greatly affected by what you eat and drink. Eating healthy foods in the appropriate amounts, at about the same times every day, can help you:  Control your blood glucose.  Lower your risk of heart disease.  Improve your blood pressure.  Reach or maintain a healthy weight. Every person with diabetes is different, and each person has different needs for a meal plan. Your health care provider may recommend that you work with a diet and nutrition specialist (dietitian) to make a meal plan that is best for you. Your meal plan may vary depending on factors such as:  The calories you need.  The medicines you take.  Your weight.  Your blood glucose, blood pressure, and cholesterol levels.  Your activity level.  Other health conditions  you have, such as heart or kidney disease. How do carbohydrates affect me? Carbohydrates, also called carbs, affect your blood glucose level more than any other type of food. Eating carbs naturally raises the amount of glucose in your blood. Carb counting is a method for keeping track of how many carbs you eat. Counting carbs is important to keep your blood glucose at a healthy level, especially if you use insulin or take certain oral diabetes medicines. It is important to know how many carbs you can safely have in each meal. This is different for every person. Your dietitian can help you calculate how many carbs you should have at each meal and for each snack. Foods that contain carbs include:  Bread, cereal, rice, pasta, and crackers.  Potatoes and corn.  Peas, beans, and lentils.  Milk and yogurt.  Fruit and juice.  Desserts, such as cakes, cookies, ice cream, and candy. How does alcohol affect me? Alcohol can cause a sudden decrease in blood glucose (hypoglycemia), especially if you use insulin or take certain oral diabetes medicines. Hypoglycemia can be a life-threatening condition. Symptoms of hypoglycemia (sleepiness, dizziness, and confusion) are similar to symptoms of having too much alcohol. If your health care provider says that alcohol is safe for you, follow these guidelines:  Limit alcohol intake to no more than 1 drink per day for nonpregnant women and 2 drinks per day for men. One drink equals 12 oz of beer, 5 oz of wine, or 1 oz of hard liquor.    Do not drink on an empty stomach.  Keep yourself hydrated with water, diet soda, or unsweetened iced tea.  Keep in mind that regular soda, juice, and other mixers may contain a lot of sugar and must be counted as carbs. What are tips for following this plan?  Reading food labels  Start by checking the serving size on the "Nutrition Facts" label of packaged foods and drinks. The amount of calories, carbs, fats, and other  nutrients listed on the label is based on one serving of the item. Many items contain more than one serving per package.  Check the total grams (g) of carbs in one serving. You can calculate the number of servings of carbs in one serving by dividing the total carbs by 15. For example, if a food has 30 g of total carbs, it would be equal to 2 servings of carbs.  Check the number of grams (g) of saturated and trans fats in one serving. Choose foods that have low or no amount of these fats.  Check the number of milligrams (mg) of salt (sodium) in one serving. Most people should limit total sodium intake to less than 2,300 mg per day.  Always check the nutrition information of foods labeled as "low-fat" or "nonfat". These foods may be higher in added sugar or refined carbs and should be avoided.  Talk to your dietitian to identify your daily goals for nutrients listed on the label. Shopping  Avoid buying canned, premade, or processed foods. These foods tend to be high in fat, sodium, and added sugar.  Shop around the outside edge of the grocery store. This includes fresh fruits and vegetables, bulk grains, fresh meats, and fresh dairy. Cooking  Use low-heat cooking methods, such as baking, instead of high-heat cooking methods like deep frying.  Cook using healthy oils, such as olive, canola, or sunflower oil.  Avoid cooking with butter, cream, or high-fat meats. Meal planning  Eat meals and snacks regularly, preferably at the same times every day. Avoid going long periods of time without eating.  Eat foods high in fiber, such as fresh fruits, vegetables, beans, and whole grains. Talk to your dietitian about how many servings of carbs you can eat at each meal.  Eat 4-6 ounces (oz) of lean protein each day, such as lean meat, chicken, fish, eggs, or tofu. One oz of lean protein is equal to: ? 1 oz of meat, chicken, or fish. ? 1 egg. ?  cup of tofu.  Eat some foods each day that contain  healthy fats, such as avocado, nuts, seeds, and fish. Lifestyle  Check your blood glucose regularly.  Exercise regularly as told by your health care provider. This may include: ? 150 minutes of moderate-intensity or vigorous-intensity exercise each week. This could be brisk walking, biking, or water aerobics. ? Stretching and doing strength exercises, such as yoga or weightlifting, at least 2 times a week.  Take medicines as told by your health care provider.  Do not use any products that contain nicotine or tobacco, such as cigarettes and e-cigarettes. If you need help quitting, ask your health care provider.  Work with a counselor or diabetes educator to identify strategies to manage stress and any emotional and social challenges. Questions to ask a health care provider  Do I need to meet with a diabetes educator?  Do I need to meet with a dietitian?  What number can I call if I have questions?  When are the best times to   check my blood glucose? Where to find more information:  American Diabetes Association: diabetes.org  Academy of Nutrition and Dietetics: www.eatright.[employer] of Diabetes and Digestive and Kidney Diseases (NIH): [employer] Summary  A healthy meal plan will help you control your blood glucose and maintain a healthy lifestyle.  Working with a diet and nutrition specialist (dietitian) can help you make a meal plan that is best for you.  Keep in mind that carbohydrates (carbs) and alcohol have immediate effects on your blood glucose levels. It is important to count carbs and to use alcohol carefully. This information is not intended to replace advice given to you by your health care provider. Make sure you discuss any questions you have with your health care provider. Document Released: 10/17/2004 Document Revised: 01/02/2017 Document Reviewed: 02/25/2016 Elsevier Patient Education  2020 [employer].  Hypertension, Adult High blood  pressure (hypertension) is when the force of blood pumping through the arteries is too strong. The arteries are the blood vessels that carry blood from the heart throughout the body. Hypertension forces the heart to work harder to pump blood and may cause arteries to become narrow or stiff. Untreated or uncontrolled hypertension can cause a heart attack, heart failure, a stroke, kidney disease, and other problems. A blood pressure reading consists of a higher number over a lower number. Ideally, your blood pressure should be below 120/80. The first ("top") number is called the systolic pressure. It is a measure of the pressure in your arteries as your heart beats. The second ("bottom") number is called the diastolic pressure. It is a measure of the pressure in your arteries as the heart relaxes. What are the causes? The exact cause of this condition is not known. There are some conditions that result in or are related to high blood pressure. What increases the risk? Some risk factors for high blood pressure are under your control. The following factors may make you more likely to develop this condition:  Smoking.  Having type 2 diabetes mellitus, high cholesterol, or both.  Not getting enough exercise or physical activity.  Being overweight.  Having too much fat, sugar, calories, or salt (sodium) in your diet.  Drinking too much alcohol. Some risk factors for high blood pressure may be difficult or impossible to change. Some of these factors include:  Having chronic kidney disease.  Having a family history of high blood pressure.  Age. Risk increases with age.  Race. You may be at higher risk if you are African American.  Gender. Men are at higher risk than women before age 62. After age 27, women are at higher risk than men.  Having obstructive sleep apnea.  Stress. What are the signs or symptoms? High blood pressure may not cause symptoms. Very high blood pressure (hypertensive  crisis) may cause:  Headache.  Anxiety.  Shortness of breath.  Nosebleed.  Nausea and vomiting.  Vision changes.  Severe chest pain.  Seizures. How is this diagnosed? This condition is diagnosed by measuring your blood pressure while you are seated, with your arm resting on a flat surface, your legs uncrossed, and your feet flat on the floor. The cuff of the blood pressure monitor will be placed directly against the skin of your upper arm at the level of your heart. It should be measured at least twice using the same arm. Certain conditions can cause a difference in blood pressure between your right and left arms. Certain factors can cause blood pressure readings to be  lower or higher than normal for a short period of time:  When your blood pressure is higher when you are in a health care provider's office than when you are at home, this is called white coat hypertension. Most people with this condition do not need medicines.  When your blood pressure is higher at home than when you are in a health care provider's office, this is called masked hypertension. Most people with this condition may need medicines to control blood pressure. If you have a high blood pressure reading during one visit or you have normal blood pressure with other risk factors, you may be asked to:  Return on a different day to have your blood pressure checked again.  Monitor your blood pressure at home for 1 week or longer. If you are diagnosed with hypertension, you may have other blood or imaging tests to help your health care provider understand your overall risk for other conditions. How is this treated? This condition is treated by making healthy lifestyle changes, such as eating healthy foods, exercising more, and reducing your alcohol intake. Your health care provider may prescribe medicine if lifestyle changes are not enough to get your blood pressure under control, and if:  Your systolic blood pressure  is above 130.  Your diastolic blood pressure is above 80. Your personal target blood pressure may vary depending on your medical conditions, your age, and other factors. Follow these instructions at home: Eating and drinking   Eat a diet that is high in fiber and potassium, and low in sodium, added sugar, and fat. An example eating plan is called the DASH (Dietary Approaches to Stop Hypertension) diet. To eat this way: ? Eat plenty of fresh fruits and vegetables. Try to fill one half of your plate at each meal with fruits and vegetables. ? Eat whole grains, such as whole-wheat pasta, brown rice, or whole-grain bread. Fill about one fourth of your plate with whole grains. ? Eat or drink low-fat dairy products, such as skim milk or low-fat yogurt. ? Avoid fatty cuts of meat, processed or cured meats, and poultry with skin. Fill about one fourth of your plate with lean proteins, such as fish, chicken without skin, beans, eggs, or tofu. ? Avoid pre-made and processed foods. These tend to be higher in sodium, added sugar, and fat.  Reduce your daily sodium intake. Most people with hypertension should eat less than 1,500 mg of sodium a day.  Do not drink alcohol if: ? Your health care provider tells you not to drink. ? You are pregnant, may be pregnant, or are planning to become pregnant.  If you drink alcohol: ? Limit how much you use to:  0-1 drink a day for women.  0-2 drinks a day for men. ? Be aware of how much alcohol is in your drink. In the [place] one drink equals one 12 oz bottle of beer (355 mL), one 5 oz glass of wine (148 mL), or one 1 oz glass of hard liquor (44 mL). Lifestyle   Work with your health care provider to maintain a healthy body weight or to lose weight. Ask what an ideal weight is for you.  Get at least 30 minutes of exercise most days of the week. Activities may include walking, swimming, or biking.  Include exercise to strengthen your muscles (resistance  exercise), such as Pilates or lifting weights, as part of your weekly exercise routine. Try to do these types of exercises for 30 minutes at least  3 days a week.  Do not use any products that contain nicotine or tobacco, such as cigarettes, e-cigarettes, and chewing tobacco. If you need help quitting, ask your health care provider.  Monitor your blood pressure at home as told by your health care provider.  Keep all follow-up visits as told by your health care provider. This is important. Medicines  Take over-the-counter and prescription medicines only as told by your health care provider. Follow directions carefully. Blood pressure medicines must be taken as prescribed.  Do not skip doses of blood pressure medicine. Doing this puts you at risk for problems and can make the medicine less effective.  Ask your health care provider about side effects or reactions to medicines that you should watch for. Contact a health care provider if you:  Think you are having a reaction to a medicine you are taking.  Have headaches that keep coming back (recurring).  Feel dizzy.  Have swelling in your ankles.  Have trouble with your vision. Get help right away if you:  Develop a severe headache or confusion.  Have unusual weakness or numbness.  Feel faint.  Have severe pain in your chest or abdomen.  Vomit repeatedly.  Have trouble breathing. Summary  Hypertension is when the force of blood pumping through your arteries is too strong. If this condition is not controlled, it may put you at risk for serious complications.  Your personal target blood pressure may vary depending on your medical conditions, your age, and other factors. For most people, a normal blood pressure is less than 120/80.  Hypertension is treated with lifestyle changes, medicines, or a combination of both. Lifestyle changes include losing weight, eating a healthy, low-sodium diet, exercising more, and limiting  alcohol. This information is not intended to replace advice given to you by your health care provider. Make sure you discuss any questions you have with your health care provider. Document Released: 01/20/2005 Document Revised: 09/30/2017 Document Reviewed: 09/30/2017 Elsevier Patient Education  2020 [employer].

## 2019-01-12 NOTE — Assessment & Plan Note (Addendum)
Elevated blood pressure.  Has missed a couple doses in the past several days.  Continue amlodipine 5 mg daily.  Increase losartan to 100 mg daily.  Follow-up in 6 months. Well-controlled diabetes with hemoglobin A1c of 6.0.  Continue metformin 1000 mg twice a day and atorvastatin.

## 2019-01-12 NOTE — Progress Notes (Signed)
Catherine Cline 63 y.o.   Chief Complaint  Patient presents with  . Diabetes    FOLLOW UP 6 MONTHS and chronic medical condition    HISTORY OF PRESENT ILLNESS: This is a 64 y.o. female with history of diabetes, hypertension, and dyslipidemia here for follow-up and medication refills. #1 diabetes: On Metformin 1000 mg twice a day Lab Results  Component Value Date   HGBA1C 6.1 (A) 07/14/2018   #2 hypertension: On amlodipine 5 mg and losartan 50 mg daily.  Blood pressure readings have been high despite compliance with medication.  Will increase losartan to 100 mg daily. BP Readings from Last 3 Encounters:  01/12/19 (!) 183/114  07/14/18 (!) 163/95  03/22/18 (!) 144/89   #3 dyslipidemia: On atorvastatin 40 mg daily  Wt Readings from Last 3 Encounters:  01/12/19 215 lb (97.5 kg)  07/14/18 209 lb 6.4 oz (95 kg)  01/14/18 210 lb 12.8 oz (95.6 kg)    #4 chronic joint pain secondary to primary osteoarthritis of multiple joints  HPI   Prior to Admission medications   Medication Sig Start Date End Date Taking? Authorizing Provider  amLODipine (NORVASC) 5 MG tablet Take 1 tablet (5 mg total) by mouth daily. 07/29/18   Horald Pollen, MD  atorvastatin (LIPITOR) 40 MG tablet Take 1 tablet (40 mg total) by mouth daily. 07/29/18   Horald Pollen, MD  Cholecalciferol (VITAMIN D3) 5000 units CAPS Take by mouth daily.    [provider]  Flaxseed, Linseed, (FLAXSEED OIL PO) Take by mouth daily.    [provider]  HYDROcodone-acetaminophen (NORCO/VICODIN) 5-325 MG tablet Take 1 tablet by mouth every 6 (six) hours as needed for severe pain. Patient not taking: Reported on 07/14/2018 03/22/18   Wieters, Hallie C, PA-C  losartan (COZAAR) 50 MG tablet Take 1 tablet (50 mg total) by mouth daily. 01/14/18 04/14/18  Horald Pollen, MD  Meloxicam 15 MG TBDP Take 1 tablet by mouth daily. 07/22/18   Horald Pollen, MD  metFORMIN (GLUCOPHAGE) 1000 MG tablet Take  1 tablet (1,000 mg total) by mouth 2 (two) times daily with a meal. 01/14/18   Yarisbel Miranda, Ines Bloomer, MD  metFORMIN (GLUCOPHAGE) 1000 MG tablet Take 1 tablet (1,000 mg total) by mouth 2 (two) times daily. 07/29/18   Horald Pollen, MD  Multiple Vitamin (MULTIVITAMIN) tablet Take 1 tablet by mouth daily.    [provider]    No Known Allergies  Patient Active Problem List   Diagnosis Date Noted  . Primary osteoarthritis involving multiple joints 07/14/2018  . Chronic joint pain 07/14/2018  . Type 2 diabetes mellitus without complication, without long-term current use of insulin ([place]) 01/14/2018  . Essential hypertension 07/07/2017  . Hyperlipidemia 07/07/2017    Past Medical History:  Diagnosis Date  . Arthritis   . Asthma   . Hypertension     Past Surgical History:  Procedure Laterality Date  . OVARIAN CYST REMOVAL      Social History   Socioeconomic History  . Marital status: Single    Spouse name: Not on file  . Number of children: Not on file  . Years of education: Not on file  . Highest education level: Not on file  Occupational History  . Not on file  Social Needs  . Financial resource strain: Not on file  . Food insecurity    Worry: Not on file    Inability: Not on file  . Transportation needs    Medical:  Not on file    Non-medical: Not on file  Tobacco Use  . Smoking status: Never Smoker  . Smokeless tobacco: Never Used  Substance and Sexual Activity  . Alcohol use: Not Currently    Comment: socially  . Drug use: Never  . Sexual activity: Not on file  Lifestyle  . Physical activity    Days per week: Not on file    Minutes per session: Not on file  . Stress: Not on file  Relationships  . Social Herbalist on phone: Not on file    Gets together: Not on file    Attends religious service: Not on file    Active member of club or organization: Not on file    Attends meetings of clubs or organizations: Not on file     Relationship status: Not on file  . Intimate partner violence    Fear of current or ex partner: Not on file    Emotionally abused: Not on file    Physically abused: Not on file    Forced sexual activity: Not on file  Other Topics Concern  . Not on file  Social History Narrative  . Not on file    Family History  Problem Relation Age of Onset  . Heart disease Mother   . Hypertension Mother   . Stroke Mother   . Diabetes Sister   . Hypertension Sister      Review of Systems  Constitutional: Negative.  Negative for chills and fever.  HENT: Negative.  Negative for congestion and sore throat.   Respiratory: Negative.  Negative for cough and shortness of breath.   Cardiovascular: Negative.  Negative for chest pain and palpitations.  Gastrointestinal: Negative.  Negative for abdominal pain, diarrhea, nausea and vomiting.  Genitourinary: Negative.  Negative for dysuria and hematuria.  Musculoskeletal: Positive for joint pain.  Skin: Negative.  Negative for rash.  Neurological: Negative.  Negative for dizziness and headaches.  All other systems reviewed and are negative.  Vitals:   01/12/19 0811  BP: (!) 183/114  Pulse: 85  Resp: 16  Temp: 98.8 F (37.1 C)  SpO2: 94%   Repeat blood pressure in the room: 160/100  Physical Exam Vitals signs reviewed.  Constitutional:      Appearance: Normal appearance.  HENT:     Head: Normocephalic.  Eyes:     Extraocular Movements: Extraocular movements intact.     Conjunctiva/sclera: Conjunctivae normal.     Pupils: Pupils are equal, round, and reactive to light.  Neck:     Musculoskeletal: Normal range of motion and neck supple.  Cardiovascular:     Rate and Rhythm: Normal rate and regular rhythm.     Pulses: Normal pulses.     Heart sounds: Normal heart sounds.  Neurological:     Mental Status: She is alert.    Results for orders placed or performed in visit on 01/12/19 (from the past 24 hour(s))  POCT glucose (manual entry)      Status: Abnormal   Collection Time: 01/12/19  8:57 AM  Result Value Ref Range   POC Glucose 114 (A) 70 - 99 mg/dl  POCT glycosylated hemoglobin (Hb A1C)     Status: Abnormal   Collection Time: 01/12/19  9:04 AM  Result Value Ref Range   Hemoglobin A1C 6.0 (A) 4.0 - 5.6 %   HbA1c POC (<> result, manual entry)     HbA1c, POC (prediabetic range)     HbA1c, POC (controlled  diabetic range)       ASSESSMENT & PLAN: Hypertension associated with diabetes ([place]) Elevated blood pressure.  Has missed a couple doses in the past several days.  Continue amlodipine 5 mg daily.  Increase losartan to 100 mg daily.  Follow-up in 6 months. Well-controlled diabetes with hemoglobin A1c of 6.0.  Continue metformin 1000 mg twice a day and atorvastatin.   Chanie was seen today for diabetes.  Diagnoses and all orders for this visit:  Hypertension associated with diabetes ([place]) -     metFORMIN (GLUCOPHAGE) 1000 MG tablet; Take 1 tablet (1,000 mg total) by mouth 2 (two) times daily with a meal. -     losartan (COZAAR) 100 MG tablet; Take 1 tablet (100 mg total) by mouth daily. -     Comprehensive metabolic panel  Dyslipidemia associated with type 2 diabetes mellitus ([facility]) -     POCT glucose (manual entry) -     POCT glycosylated hemoglobin (Hb A1C) -     Lipid panel  Chronic joint pain  Primary osteoarthritis involving multiple joints  Hyperlipidemia, unspecified hyperlipidemia type -     amLODipine (NORVASC) 5 MG tablet; Take 1 tablet (5 mg total) by mouth daily. -     atorvastatin (LIPITOR) 40 MG tablet; Take 1 tablet (40 mg total) by mouth daily.  Essential hypertension -     amLODipine (NORVASC) 5 MG tablet; Take 1 tablet (5 mg total) by mouth daily.  Encounter for screening mammogram for malignant neoplasm of breast -     MM Digital Screening; Future  Other orders -     albuterol (VENTOLIN HFA) 108 (90 Base) MCG/ACT inhaler; Inhale 2 puffs into the lungs every 6 (six) hours as needed for  wheezing or shortness of breath.    Patient Instructions       If you have lab work done today you will be contacted with your lab results within the next 2 weeks.  If you have not heard from [place] then please contact us. The fastest way to get your results is to register for My Chart.   IF you received an x-ray today, you will receive an invoice from [facility]. Please contact [facility] at [phone] with questions or concerns regarding your invoice.   IF you received labwork today, you will receive an invoice from [facility]. Please contact [facility] at [phone] with questions or concerns regarding your invoice.   Our billing staff will not be able to assist you with questions regarding bills from these companies.  You will be contacted with the lab results as soon as they are available. The fastest way to get your results is to activate your My Chart account. Instructions are located on the last page of this paperwork. If you have not heard from [place] regarding the results in 2 weeks, please contact this office.     Diabetes Mellitus and Nutrition, Adult When you have diabetes (diabetes mellitus), it is very important to have healthy eating habits because your blood sugar (glucose) levels are greatly affected by what you eat and drink. Eating healthy foods in the appropriate amounts, at about the same times every day, can help you:  Control your blood glucose.  Lower your risk of heart disease.  Improve your blood pressure.  Reach or maintain a healthy weight. Every person with diabetes is different, and each person has different needs for a meal plan. Your health care provider may recommend that you work with a diet and nutrition specialist (  dietitian) to make a meal plan that is best for you. Your meal plan may vary depending on factors such as:  The calories you need.  The medicines you take.  Your weight.  Your blood glucose, blood pressure, and  cholesterol levels.  Your activity level.  Other health conditions you have, such as heart or kidney disease. How do carbohydrates affect me? Carbohydrates, also called carbs, affect your blood glucose level more than any other type of food. Eating carbs naturally raises the amount of glucose in your blood. Carb counting is a method for keeping track of how many carbs you eat. Counting carbs is important to keep your blood glucose at a healthy level, especially if you use insulin or take certain oral diabetes medicines. It is important to know how many carbs you can safely have in each meal. This is different for every person. Your dietitian can help you calculate how many carbs you should have at each meal and for each snack. Foods that contain carbs include:  Bread, cereal, rice, pasta, and crackers.  Potatoes and corn.  Peas, beans, and lentils.  Milk and yogurt.  Fruit and juice.  Desserts, such as cakes, cookies, ice cream, and candy. How does alcohol affect me? Alcohol can cause a sudden decrease in blood glucose (hypoglycemia), especially if you use insulin or take certain oral diabetes medicines. Hypoglycemia can be a life-threatening condition. Symptoms of hypoglycemia (sleepiness, dizziness, and confusion) are similar to symptoms of having too much alcohol. If your health care provider says that alcohol is safe for you, follow these guidelines:  Limit alcohol intake to no more than 1 drink per day for nonpregnant women and 2 drinks per day for men. One drink equals 12 oz of beer, 5 oz of wine, or 1 oz of hard liquor.  Do not drink on an empty stomach.  Keep yourself hydrated with water, diet soda, or unsweetened iced tea.  Keep in mind that regular soda, juice, and other mixers may contain a lot of sugar and must be counted as carbs. What are tips for following this plan?  Reading food labels  Start by checking the serving size on the "Nutrition Facts" label of packaged  foods and drinks. The amount of calories, carbs, fats, and other nutrients listed on the label is based on one serving of the item. Many items contain more than one serving per package.  Check the total grams (g) of carbs in one serving. You can calculate the number of servings of carbs in one serving by dividing the total carbs by 15. For example, if a food has 30 g of total carbs, it would be equal to 2 servings of carbs.  Check the number of grams (g) of saturated and trans fats in one serving. Choose foods that have low or no amount of these fats.  Check the number of milligrams (mg) of salt (sodium) in one serving. Most people should limit total sodium intake to less than 2,300 mg per day.  Always check the nutrition information of foods labeled as "low-fat" or "nonfat". These foods may be higher in added sugar or refined carbs and should be avoided.  Talk to your dietitian to identify your daily goals for nutrients listed on the label. Shopping  Avoid buying canned, premade, or processed foods. These foods tend to be high in fat, sodium, and added sugar.  Shop around the outside edge of the grocery store. This includes fresh fruits and vegetables, bulk grains, fresh  meats, and fresh dairy. Cooking  Use low-heat cooking methods, such as baking, instead of high-heat cooking methods like deep frying.  Cook using healthy oils, such as olive, canola, or sunflower oil.  Avoid cooking with butter, cream, or high-fat meats. Meal planning  Eat meals and snacks regularly, preferably at the same times every day. Avoid going long periods of time without eating.  Eat foods high in fiber, such as fresh fruits, vegetables, beans, and whole grains. Talk to your dietitian about how many servings of carbs you can eat at each meal.  Eat 4-6 ounces (oz) of lean protein each day, such as lean meat, chicken, fish, eggs, or tofu. One oz of lean protein is equal to: ? 1 oz of meat, chicken, or fish. ? 1  egg. ?  cup of tofu.  Eat some foods each day that contain healthy fats, such as avocado, nuts, seeds, and fish. Lifestyle  Check your blood glucose regularly.  Exercise regularly as told by your health care provider. This may include: ? 150 minutes of moderate-intensity or vigorous-intensity exercise each week. This could be brisk walking, biking, or water aerobics. ? Stretching and doing strength exercises, such as yoga or weightlifting, at least 2 times a week.  Take medicines as told by your health care provider.  Do not use any products that contain nicotine or tobacco, such as cigarettes and e-cigarettes. If you need help quitting, ask your health care provider.  Work with a Social worker or diabetes educator to identify strategies to manage stress and any emotional and social challenges. Questions to ask a health care provider  Do I need to meet with a diabetes educator?  Do I need to meet with a dietitian?  What number can I call if I have questions?  When are the best times to check my blood glucose? Where to find more information:  American Diabetes Association: diabetes.org  Academy of Nutrition and Dietetics: www.eatright.[employer] of Diabetes and Digestive and Kidney Diseases (NIH): [employer] Summary  A healthy meal plan will help you control your blood glucose and maintain a healthy lifestyle.  Working with a diet and nutrition specialist (dietitian) can help you make a meal plan that is best for you.  Keep in mind that carbohydrates (carbs) and alcohol have immediate effects on your blood glucose levels. It is important to count carbs and to use alcohol carefully. This information is not intended to replace advice given to you by your health care provider. Make sure you discuss any questions you have with your health care provider. Document Released: 10/17/2004 Document Revised: 01/02/2017 Document Reviewed: 02/25/2016 Elsevier Patient  Education  2020 [employer].  Hypertension, Adult High blood pressure (hypertension) is when the force of blood pumping through the arteries is too strong. The arteries are the blood vessels that carry blood from the heart throughout the body. Hypertension forces the heart to work harder to pump blood and may cause arteries to become narrow or stiff. Untreated or uncontrolled hypertension can cause a heart attack, heart failure, a stroke, kidney disease, and other problems. A blood pressure reading consists of a higher number over a lower number. Ideally, your blood pressure should be below 120/80. The first ("top") number is called the systolic pressure. It is a measure of the pressure in your arteries as your heart beats. The second ("bottom") number is called the diastolic pressure. It is a measure of the pressure in your arteries as the heart relaxes. What are the  causes? The exact cause of this condition is not known. There are some conditions that result in or are related to high blood pressure. What increases the risk? Some risk factors for high blood pressure are under your control. The following factors may make you more likely to develop this condition:  Smoking.  Having type 2 diabetes mellitus, high cholesterol, or both.  Not getting enough exercise or physical activity.  Being overweight.  Having too much fat, sugar, calories, or salt (sodium) in your diet.  Drinking too much alcohol. Some risk factors for high blood pressure may be difficult or impossible to change. Some of these factors include:  Having chronic kidney disease.  Having a family history of high blood pressure.  Age. Risk increases with age.  Race. You may be at higher risk if you are African American.  Gender. Men are at higher risk than women before age 72. After age 55, women are at higher risk than men.  Having obstructive sleep apnea.  Stress. What are the signs or symptoms? High blood pressure  may not cause symptoms. Very high blood pressure (hypertensive crisis) may cause:  Headache.  Anxiety.  Shortness of breath.  Nosebleed.  Nausea and vomiting.  Vision changes.  Severe chest pain.  Seizures. How is this diagnosed? This condition is diagnosed by measuring your blood pressure while you are seated, with your arm resting on a flat surface, your legs uncrossed, and your feet flat on the floor. The cuff of the blood pressure monitor will be placed directly against the skin of your upper arm at the level of your heart. It should be measured at least twice using the same arm. Certain conditions can cause a difference in blood pressure between your right and left arms. Certain factors can cause blood pressure readings to be lower or higher than normal for a short period of time:  When your blood pressure is higher when you are in a health care provider's office than when you are at home, this is called white coat hypertension. Most people with this condition do not need medicines.  When your blood pressure is higher at home than when you are in a health care provider's office, this is called masked hypertension. Most people with this condition may need medicines to control blood pressure. If you have a high blood pressure reading during one visit or you have normal blood pressure with other risk factors, you may be asked to:  Return on a different day to have your blood pressure checked again.  Monitor your blood pressure at home for 1 week or longer. If you are diagnosed with hypertension, you may have other blood or imaging tests to help your health care provider understand your overall risk for other conditions. How is this treated? This condition is treated by making healthy lifestyle changes, such as eating healthy foods, exercising more, and reducing your alcohol intake. Your health care provider may prescribe medicine if lifestyle changes are not enough to get your blood  pressure under control, and if:  Your systolic blood pressure is above 130.  Your diastolic blood pressure is above 80. Your personal target blood pressure may vary depending on your medical conditions, your age, and other factors. Follow these instructions at home: Eating and drinking   Eat a diet that is high in fiber and potassium, and low in sodium, added sugar, and fat. An example eating plan is called the DASH (Dietary Approaches to Stop Hypertension) diet. To eat this  way: ? Eat plenty of fresh fruits and vegetables. Try to fill one half of your plate at each meal with fruits and vegetables. ? Eat whole grains, such as whole-wheat pasta, brown rice, or whole-grain bread. Fill about one fourth of your plate with whole grains. ? Eat or drink low-fat dairy products, such as skim milk or low-fat yogurt. ? Avoid fatty cuts of meat, processed or cured meats, and poultry with skin. Fill about one fourth of your plate with lean proteins, such as fish, chicken without skin, beans, eggs, or tofu. ? Avoid pre-made and processed foods. These tend to be higher in sodium, added sugar, and fat.  Reduce your daily sodium intake. Most people with hypertension should eat less than 1,500 mg of sodium a day.  Do not drink alcohol if: ? Your health care provider tells you not to drink. ? You are pregnant, may be pregnant, or are planning to become pregnant.  If you drink alcohol: ? Limit how much you use to:  0-1 drink a day for women.  0-2 drinks a day for men. ? Be aware of how much alcohol is in your drink. In the [place] one drink equals one 12 oz bottle of beer (355 mL), one 5 oz glass of wine (148 mL), or one 1 oz glass of hard liquor (44 mL). Lifestyle   Work with your health care provider to maintain a healthy body weight or to lose weight. Ask what an ideal weight is for you.  Get at least 30 minutes of exercise most days of the week. Activities may include walking, swimming, or biking.   Include exercise to strengthen your muscles (resistance exercise), such as Pilates or lifting weights, as part of your weekly exercise routine. Try to do these types of exercises for 30 minutes at least 3 days a week.  Do not use any products that contain nicotine or tobacco, such as cigarettes, e-cigarettes, and chewing tobacco. If you need help quitting, ask your health care provider.  Monitor your blood pressure at home as told by your health care provider.  Keep all follow-up visits as told by your health care provider. This is important. Medicines  Take over-the-counter and prescription medicines only as told by your health care provider. Follow directions carefully. Blood pressure medicines must be taken as prescribed.  Do not skip doses of blood pressure medicine. Doing this puts you at risk for problems and can make the medicine less effective.  Ask your health care provider about side effects or reactions to medicines that you should watch for. Contact a health care provider if you:  Think you are having a reaction to a medicine you are taking.  Have headaches that keep coming back (recurring).  Feel dizzy.  Have swelling in your ankles.  Have trouble with your vision. Get help right away if you:  Develop a severe headache or confusion.  Have unusual weakness or numbness.  Feel faint.  Have severe pain in your chest or abdomen.  Vomit repeatedly.  Have trouble breathing. Summary  Hypertension is when the force of blood pumping through your arteries is too strong. If this condition is not controlled, it may put you at risk for serious complications.  Your personal target blood pressure may vary depending on your medical conditions, your age, and other factors. For most people, a normal blood pressure is less than 120/80.  Hypertension is treated with lifestyle changes, medicines, or a combination of both. Lifestyle changes include losing weight,  eating a  healthy, low-sodium diet, exercising more, and limiting alcohol. This information is not intended to replace advice given to you by your health care provider. Make sure you discuss any questions you have with your health care provider. Document Released: 01/20/2005 Document Revised: 09/30/2017 Document Reviewed: 09/30/2017 Elsevier Patient Education  2020 Elsevier Inc.      Agustina Caroli, MD [facility]

## 2019-01-13 ENCOUNTER — Encounter: Payer: Self-pay | Admitting: Emergency Medicine

## 2019-01-13 LAB — LIPID PANEL
Chol/HDL Ratio: 2.7 ratio (ref 0.0–4.4)
Cholesterol, Total: 145 mg/dL (ref 100–199)
HDL: 54 mg/dL (ref >39)
LDL Chol Calc (NIH): 71 mg/dL (ref 0–99)
Triglycerides: 109 mg/dL (ref 0–149)
VLDL Cholesterol Cal: 20 mg/dL (ref 5–40)

## 2019-01-13 LAB — COMPREHENSIVE METABOLIC PANEL
ALT: 26 IU/L (ref 0–32)
AST: 32 IU/L (ref 0–40)
Albumin/Globulin Ratio: 1.3 (ref 1.2–2.2)
Albumin: 4.2 g/dL (ref 3.8–4.8)
Alkaline Phosphatase: 99 IU/L (ref 39–117)
BUN/Creatinine Ratio: 24 (ref 12–28)
BUN: 16 mg/dL (ref 8–27)
Bilirubin Total: 0.3 mg/dL (ref 0.0–1.2)
CO2: 20 mmol/L (ref 20–29)
Calcium: 9.4 mg/dL (ref 8.7–10.3)
Chloride: 103 mmol/L (ref 96–106)
Creatinine, Ser: 0.67 mg/dL (ref 0.57–1.00)
GFR calc Af Amer: 109 mL/min/{1.73_m2} (ref >59)
GFR calc non Af Amer: 95 mL/min/{1.73_m2} (ref >59)
Globulin, Total: 3.3 g/dL (ref 1.5–4.5)
Glucose: 111 mg/dL — ABNORMAL HIGH (ref 65–99)
Potassium: 5.5 mmol/L — ABNORMAL HIGH (ref 3.5–5.2)
Sodium: 142 mmol/L (ref 134–144)
Total Protein: 7.5 g/dL (ref 6.0–8.5)

## 2019-02-04 HISTORY — PX: PARTIAL KNEE ARTHROPLASTY: SHX2174

## 2019-04-25 ENCOUNTER — Other Ambulatory Visit: Payer: Self-pay

## 2019-04-25 ENCOUNTER — Ambulatory Visit (HOSPITAL_COMMUNITY)
Admission: EM | Admit: 2019-04-25 | Discharge: 2019-04-25 | Disposition: A | Discharge status: Home / Self Care | Payer: BC Managed Care – PPO | Attending: Internal Medicine | Admitting: Internal Medicine

## 2019-04-25 ENCOUNTER — Encounter (HOSPITAL_COMMUNITY): Payer: Self-pay

## 2019-04-25 DIAGNOSIS — J302 Other seasonal allergic rhinitis: Secondary | ICD-10-CM

## 2019-04-25 DIAGNOSIS — J4531 Mild persistent asthma with (acute) exacerbation: Secondary | ICD-10-CM | POA: Diagnosis not present

## 2019-04-25 MED ORDER — MONTELUKAST SODIUM 10 MG PO TABS: 10.0000 mg | ORAL_TABLET | Freq: Every day | ORAL | 2 refills | Status: DC

## 2019-04-25 MED ORDER — PREDNISONE 20 MG PO TABS: 20.0000 mg | ORAL_TABLET | Freq: Every day | ORAL | 0 refills | Status: AC

## 2019-04-25 NOTE — ED Triage Notes (Signed)
Pt c/o SOB and tightness in lungsx2 wks. Pt states she has been using her inhaler and nebulizer, but not helping a lot. Pt states she has been using the inhaler more often than normal. Pt has expiratory wheezes in left lower lobe and upper right lobe. Pt has non labored breathing. Skin color WNL.

## 2019-04-25 NOTE — ED Provider Notes (Signed)
[place]    CSN: [id number] Arrival date & time: 04/25/19  [id number]      History   Chief Complaint Chief Complaint  Patient presents with  . Shortness of Breath    HPI Catherine Cline is a 64 y.o. female with a history of asthma comes to urgent care complaining of shortness of breath, chest tightness and wheezing of 2 weeks duration.  Patient symptoms started insidiously and is gotten progressively worse.  She is using her nebulizer treatments several times a day.  No chest pain or chest pressure.  Patient is on albuterol nebulizer treatments.  Patient has seasonal allergies which usually start around this time of the year.  She is not taking any allergy medications.  No fever or chills.  No sputum production.  No postnasal drip.  No diarrhea.   HPI  Past Medical History:  Diagnosis Date  . Arthritis   . Asthma   . Hypertension     Patient Active Problem List   Diagnosis Date Noted  . Primary osteoarthritis involving multiple joints 07/14/2018  . Chronic joint pain 07/14/2018  . Dyslipidemia associated with type 2 diabetes mellitus ([place]) 01/14/2018  . Hypertension associated with diabetes ([place]) 07/07/2017  . Hyperlipidemia 07/07/2017    Past Surgical History:  Procedure Laterality Date  . OVARIAN CYST REMOVAL      OB History   No obstetric history on file.      Home Medications    Prior to Admission medications   Medication Sig Start Date End Date Taking? Authorizing Provider  albuterol (VENTOLIN HFA) 108 (90 Base) MCG/ACT inhaler Inhale 2 puffs into the lungs every 6 (six) hours as needed for wheezing or shortness of breath. 01/12/19   Horald Pollen, MD  amLODipine (NORVASC) 5 MG tablet 5 mg.    [provider]  aspirin EC 81 MG tablet Take 81 mg by mouth daily.    [provider]  atorvastatin (LIPITOR) 40 MG tablet Take 1 tablet (40 mg total) by mouth daily. 01/12/19   Horald Pollen, MD  Cholecalciferol (VITAMIN D3)  5000 units CAPS Take by mouth daily.    [provider]  Levocetirizine Dihydrochloride (XYZAL ALLERGY 24HR PO) Take by mouth.    [provider]  losartan (COZAAR) 100 MG tablet Take 1 tablet (100 mg total) by mouth daily. 01/12/19   Horald Pollen, MD  metFORMIN (GLUCOPHAGE) 1000 MG tablet Take 1 tablet (1,000 mg total) by mouth 2 (two) times daily with a meal. 01/12/19   Sagardia, Ines Bloomer, MD  montelukast (SINGULAIR) 10 MG tablet Take 1 tablet (10 mg total) by mouth at bedtime. 04/25/19   Chase Picket, MD  Multiple Vitamin (MULTIVITAMIN) tablet Take 1 tablet by mouth daily.    [provider]  predniSONE (DELTASONE) 20 MG tablet Take 1 tablet (20 mg total) by mouth daily for 5 days. 04/25/19 04/30/19  Chase Picket, MD    Family History Family History  Problem Relation Age of Onset  . Heart disease Mother   . Hypertension Mother   . Stroke Mother   . Diabetes Sister   . Hypertension Sister     Social History Social History   Tobacco Use  . Smoking status: Never Smoker  . Smokeless tobacco: Never Used  Substance Use Topics  . Alcohol use: Not Currently    Comment: socially  . Drug use: Never     Allergies   Patient has no known allergies.  Review of Systems Review of Systems  Constitutional: Negative for activity change, chills, fatigue and fever.  HENT: Positive for congestion. Negative for ear discharge and ear pain.   Respiratory: Positive for cough, chest tightness, shortness of breath and wheezing.   Cardiovascular: Negative for chest pain.  Gastrointestinal: Negative for abdominal pain, nausea and vomiting.  Musculoskeletal: Negative for arthralgias and joint swelling.  Skin: Negative.   Neurological: Negative for dizziness, light-headedness and headaches.  Psychiatric/Behavioral: Negative for confusion and decreased concentration.     Physical Exam Triage Vital Signs ED Triage Vitals  Enc Vitals Group     BP  04/25/19 0829 (!) 167/98     Pulse Rate 04/25/19 0829 95     Resp 04/25/19 0829 18     Temp 04/25/19 0829 98.5 F (36.9 C)     Temp Source 04/25/19 0829 Oral     SpO2 04/25/19 0829 98 %     Weight 04/25/19 0838 215 lb (97.5 kg)     Height 04/25/19 0838 5\' 2"  (1.575 m)     Head Circumference --      Peak Flow --      Pain Score 04/25/19 0838 0     Pain Loc --      Pain Edu? --      Excl. in [place]? --    No data found.  Updated Vital Signs BP (!) 167/98 (BP Location: Left Arm)   Pulse 95   Temp 98.5 F (36.9 C) (Oral)   Resp 18   Ht 5\' 2"  (1.575 m)   Wt 97.5 kg   SpO2 98%   BMI 39.32 kg/m   Visual Acuity Right Eye Distance:   Left Eye Distance:   Bilateral Distance:    Right Eye Near:   Left Eye Near:    Bilateral Near:     Physical Exam Vitals and nursing note reviewed.  Constitutional:      General: She is not in acute distress.    Appearance: She is not ill-appearing.  Pulmonary:     Breath sounds: Wheezing present. No decreased breath sounds, rhonchi or rales.  Chest:     Chest wall: No mass, crepitus or edema.  Abdominal:     Palpations: Abdomen is soft.  Musculoskeletal:        General: Normal range of motion.     Cervical back: Normal range of motion.  Skin:    Capillary Refill: Capillary refill takes less than 2 seconds.  Neurological:     Mental Status: She is alert.      UC Treatments / Results  Labs (all labs ordered are listed, but only abnormal results are displayed) Labs Reviewed - No data to display  EKG   Radiology No results found.  Procedures Procedures (including critical care time)  Medications Ordered in UC Medications - No data to display  Initial Impression / Assessment and Plan / UC Course  I have reviewed the triage vital signs and the nursing notes.  Pertinent labs & imaging results that were available during my care of the patient were reviewed by me and considered in my medical decision making (see chart for  details).     1.  Mild persistent asthma with exacerbation: Continue nebulizer treatments Prednisone 20 mg orally daily x5 days Return precautions given Patient takes over-the-counter antihistamine ([place]) with no improvement in his symptoms.  I will switch her to Singulair.  Patient is advised to stay on Singulair during the allergy season. She verbalizes  understanding. Final Clinical Impressions(s) / UC Diagnoses   Final diagnoses:  Mild persistent asthma with exacerbation  Seasonal allergies   Discharge Instructions   None    ED Prescriptions    Medication Sig Dispense Auth. Provider   montelukast (SINGULAIR) 10 MG tablet Take 1 tablet (10 mg total) by mouth at bedtime. 30 tablet Deshia Vanderhoof, Myrene Galas, MD   predniSONE (DELTASONE) 20 MG tablet Take 1 tablet (20 mg total) by mouth daily for 5 days. 5 tablet Nyiah Pianka, Myrene Galas, MD     PDMP not reviewed this encounter.   Chase Picket, MD 04/25/19 1450

## 2019-07-13 ENCOUNTER — Ambulatory Visit (INDEPENDENT_AMBULATORY_CARE_PROVIDER_SITE_OTHER): Payer: BC Managed Care – PPO | Admitting: Emergency Medicine

## 2019-07-13 ENCOUNTER — Other Ambulatory Visit: Payer: Self-pay

## 2019-07-13 ENCOUNTER — Encounter: Payer: Self-pay | Admitting: Emergency Medicine

## 2019-07-13 VITALS — BP 160/86 | HR 88 | Temp 98.0°F | Ht 62.0 in | Wt 211.0 lb

## 2019-07-13 DIAGNOSIS — I10 Essential (primary) hypertension: Secondary | ICD-10-CM | POA: Diagnosis not present

## 2019-07-13 DIAGNOSIS — E1159 Type 2 diabetes mellitus with other circulatory complications: Secondary | ICD-10-CM

## 2019-07-13 DIAGNOSIS — E785 Hyperlipidemia, unspecified: Secondary | ICD-10-CM

## 2019-07-13 DIAGNOSIS — J4541 Moderate persistent asthma with (acute) exacerbation: Secondary | ICD-10-CM | POA: Diagnosis not present

## 2019-07-13 DIAGNOSIS — E1169 Type 2 diabetes mellitus with other specified complication: Secondary | ICD-10-CM

## 2019-07-13 LAB — COMPREHENSIVE METABOLIC PANEL
ALT: 17 IU/L (ref 0–32)
AST: 14 IU/L (ref 0–40)
Albumin/Globulin Ratio: 1.3 (ref 1.2–2.2)
Albumin: 4.3 g/dL (ref 3.8–4.8)
Alkaline Phosphatase: 100 IU/L (ref 48–121)
BUN/Creatinine Ratio: 29 — ABNORMAL HIGH (ref 12–28)
BUN: 23 mg/dL (ref 8–27)
Bilirubin Total: 0.2 mg/dL (ref 0.0–1.2)
CO2: 23 mmol/L (ref 20–29)
Calcium: 10 mg/dL (ref 8.7–10.3)
Chloride: 103 mmol/L (ref 96–106)
Creatinine, Ser: 0.78 mg/dL (ref 0.57–1.00)
GFR calc Af Amer: 94 mL/min/{1.73_m2} (ref >59)
GFR calc non Af Amer: 82 mL/min/{1.73_m2} (ref >59)
Globulin, Total: 3.2 g/dL (ref 1.5–4.5)
Glucose: 97 mg/dL (ref 65–99)
Potassium: 5 mmol/L (ref 3.5–5.2)
Sodium: 140 mmol/L (ref 134–144)
Total Protein: 7.5 g/dL (ref 6.0–8.5)

## 2019-07-13 LAB — CBC WITH DIFFERENTIAL/PLATELET
Basophils Absolute: 0 10*3/uL (ref 0.0–0.2)
Basos: 1 %
EOS (ABSOLUTE): 0.3 10*3/uL (ref 0.0–0.4)
Eos: 5 %
Hematocrit: 36.7 % (ref 34.0–46.6)
Hemoglobin: 10.9 g/dL — ABNORMAL LOW (ref 11.1–15.9)
Immature Grans (Abs): 0 10*3/uL (ref 0.0–0.1)
Immature Granulocytes: 0 %
Lymphocytes Absolute: 1.5 10*3/uL (ref 0.7–3.1)
Lymphs: 22 %
MCH: 22.2 pg — ABNORMAL LOW (ref 26.6–33.0)
MCHC: 29.7 g/dL — ABNORMAL LOW (ref 31.5–35.7)
MCV: 75 fL — ABNORMAL LOW (ref 79–97)
Monocytes Absolute: 0.3 10*3/uL (ref 0.1–0.9)
Monocytes: 4 %
Neutrophils Absolute: 4.6 10*3/uL (ref 1.4–7.0)
Neutrophils: 68 %
Platelets: 268 10*3/uL (ref 150–450)
RBC: 4.92 x10E6/uL (ref 3.77–5.28)
RDW: 15.4 % (ref 11.7–15.4)
WBC: 6.7 10*3/uL (ref 3.4–10.8)

## 2019-07-13 LAB — GLUCOSE, POCT (MANUAL RESULT ENTRY): POC Glucose: 105 mg/dl — AB (ref 70–99)

## 2019-07-13 LAB — POCT GLYCOSYLATED HEMOGLOBIN (HGB A1C): Hemoglobin A1C: 6.3 % — AB (ref 4.0–5.6)

## 2019-07-13 LAB — LIPID PANEL
Chol/HDL Ratio: 2.7 ratio (ref 0.0–4.4)
Cholesterol, Total: 134 mg/dL (ref 100–199)
HDL: 49 mg/dL (ref >39)
LDL Chol Calc (NIH): 65 mg/dL (ref 0–99)
Triglycerides: 107 mg/dL (ref 0–149)
VLDL Cholesterol Cal: 20 mg/dL (ref 5–40)

## 2019-07-13 MED ORDER — ALBUTEROL SULFATE (2.5 MG/3ML) 0.083% IN NEBU: 2.5000 mg | INHALATION_SOLUTION | Freq: Four times a day (QID) | RESPIRATORY_TRACT | 1 refills | Status: DC | PRN

## 2019-07-13 MED ORDER — BUDESONIDE-FORMOTEROL FUMARATE 80-4.5 MCG/ACT IN AERO: 2.0000 | INHALATION_SPRAY | Freq: Two times a day (BID) | RESPIRATORY_TRACT | 3 refills | Status: DC

## 2019-07-13 MED ORDER — PREDNISONE 20 MG PO TABS: 40.0000 mg | ORAL_TABLET | Freq: Every day | ORAL | 0 refills | Status: AC

## 2019-07-13 MED ORDER — LOSARTAN POTASSIUM-HCTZ 100-12.5 MG PO TABS: 1.0000 | ORAL_TABLET | Freq: Every day | ORAL | 3 refills | Status: DC

## 2019-07-13 MED ORDER — METHYLPREDNISOLONE SODIUM SUCC 125 MG IJ SOLR
125.0000 mg | Freq: Once | INTRAMUSCULAR | Status: AC
  Administered 2019-07-13: 125 mg via INTRAMUSCULAR

## 2019-07-13 NOTE — Assessment & Plan Note (Signed)
Chronic asthma not well controlled. Using rescue inhaler too frequently. Needs maintenance therapy with Symbicort twice a day. Will start prednisone 40 mg daily for 5 days.

## 2019-07-13 NOTE — Progress Notes (Signed)
Catherine Cline 64 y.o.   Chief Complaint  Patient presents with  . Medical Management of Chronic Issues    6 m f/u   . Diabetes    today home fast bloodsugar was 125   . Hypertension  . Hyperlipidemia  ASSESSMENT & PLAN: Hypertension associated with diabetes ([place]) Elevated blood pressure.  Has missed a couple doses in the past several days.  Continue amlodipine 5 mg daily.  Increase losartan to 100 mg daily.  Follow-up in 6 months. Well-controlled diabetes with hemoglobin A1c of 6.0.  Continue metformin 1000 mg twice a day and atorvastatin.  HISTORY OF PRESENT ILLNESS: This is a 64 y.o. female with history of diabetes, hypertension, dyslipidemia and asthma here for follow-up. 1.  Diabetes: On Metformin 1000 mg twice a day.  Glucose in the morning at home 120s. Lab Results  Component Value Date   HGBA1C 6.0 (A) 01/12/2019   2.  Hypertension: On amlodipine 5 mg and losartan 100 mg daily.  Blood pressure at home 150s over 90s. BP Readings from Last 3 Encounters:  07/13/19 (!) 162/90  04/25/19 (!) 167/98  01/12/19 (!) 183/114   3.  Dyslipidemia: On Lipitor 40 mg daily. Lab Results  Component Value Date   CHOL 145 01/12/2019   HDL 54 01/12/2019   LDLCALC 71 01/12/2019   TRIG 109 01/12/2019   CHOLHDL 2.7 01/12/2019   4.  History of asthma: Asthma acting up lately.  Using rescue inhaler several times during the day.  May be allergic.  Denies fever or flulike symptoms.  Not on any maintenance inhaler medication.  Fully vaccinated against Covid.  HPI   Prior to Admission medications   Medication Sig Start Date End Date Taking? Authorizing Provider  albuterol (VENTOLIN HFA) 108 (90 Base) MCG/ACT inhaler Inhale 2 puffs into the lungs every 6 (six) hours as needed for wheezing or shortness of breath. 01/12/19  Yes Chiante Peden, Ines Bloomer, MD  amLODipine (NORVASC) 5 MG tablet 5 mg.   Yes [provider]  aspirin EC 81 MG tablet Take 81 mg by mouth in the morning and at  bedtime.    Yes [provider]  atorvastatin (LIPITOR) 40 MG tablet Take 1 tablet (40 mg total) by mouth daily. 01/12/19  Yes Arion Morgan, Ines Bloomer, MD  Cholecalciferol (VITAMIN D3) 5000 units CAPS Take by mouth daily.   Yes [provider]  losartan (COZAAR) 100 MG tablet Take 1 tablet (100 mg total) by mouth daily. 01/12/19  Yes Horald Pollen, MD  metFORMIN (GLUCOPHAGE) 1000 MG tablet Take 1 tablet (1,000 mg total) by mouth 2 (two) times daily with a meal. 01/12/19  Yes Pema Thomure, Ines Bloomer, MD  montelukast (SINGULAIR) 10 MG tablet Take 1 tablet (10 mg total) by mouth at bedtime. 04/25/19  Yes Lamptey, Myrene Galas, MD  Multiple Vitamin (MULTIVITAMIN) tablet Take 1 tablet by mouth daily.   Yes [provider]  Levocetirizine Dihydrochloride (XYZAL ALLERGY 24HR PO) Take by mouth.    [provider]    No Known Allergies  Patient Active Problem List   Diagnosis Date Noted  . Primary osteoarthritis involving multiple joints 07/14/2018  . Chronic joint pain 07/14/2018  . Dyslipidemia associated with type 2 diabetes mellitus ([place]) 01/14/2018  . Hypertension associated with diabetes ([place]) 07/07/2017  . Hyperlipidemia 07/07/2017    Past Medical History:  Diagnosis Date  . Arthritis   . Asthma   . Hypertension     Past Surgical History:  Procedure Laterality Date  .  OVARIAN CYST REMOVAL      Social History   Socioeconomic History  . Marital status: Single    Spouse name: Not on file  . Number of children: Not on file  . Years of education: Not on file  . Highest education level: Not on file  Occupational History  . Not on file  Tobacco Use  . Smoking status: Never Smoker  . Smokeless tobacco: Never Used  Substance and Sexual Activity  . Alcohol use: Not Currently    Comment: socially  . Drug use: Never  . Sexual activity: Not on file  Other Topics Concern  . Not on file  Social History Narrative  . Not on file   Social  Determinants of Health   Financial Resource Strain:   . Difficulty of Paying Living Expenses:   Food Insecurity:   . Worried About Charity fundraiser in the Last Year:   . Arboriculturist in the Last Year:   Transportation Needs:   . Film/video editor (Medical):   Marland Kitchen Lack of Transportation (Non-Medical):   Physical Activity:   . Days of Exercise per Week:   . Minutes of Exercise per Session:   Stress:   . Feeling of Stress :   Social Connections:   . Frequency of Communication with Friends and Family:   . Frequency of Social Gatherings with Friends and Family:   . Attends Religious Services:   . Active Member of Clubs or Organizations:   . Attends Archivist Meetings:   Marland Kitchen Marital Status:   Intimate Partner Violence:   . Fear of Current or Ex-Partner:   . Emotionally Abused:   Marland Kitchen Physically Abused:   . Sexually Abused:     Family History  Problem Relation Age of Onset  . Heart disease Mother   . Hypertension Mother   . Stroke Mother   . Diabetes Sister   . Hypertension Sister      Review of Systems  Constitutional: Negative.  Negative for chills and fever.  HENT: Negative.  Negative for congestion and sore throat.   Respiratory: Positive for shortness of breath and wheezing. Negative for cough, hemoptysis and sputum production.   Cardiovascular: Negative.  Negative for chest pain and palpitations.  Gastrointestinal: Negative.  Negative for abdominal pain, diarrhea, nausea and vomiting.  Genitourinary: Negative.  Negative for dysuria and hematuria.  Skin: Negative.  Negative for rash.  Neurological: Negative.  Negative for dizziness and headaches.  All other systems reviewed and are negative.   Today's Vitals   07/13/19 0757  BP: (!) 162/90  Pulse: 88  Temp: 98 F (36.7 C)  TempSrc: Temporal  SpO2: 94%  Weight: 211 lb (95.7 kg)  Height: 5\' 2"  (1.575 m)   Body mass index is 38.59 kg/m.  Physical Exam Vitals reviewed.  Constitutional:       Appearance: Normal appearance. She is obese.  HENT:     Head: Normocephalic.  Eyes:     Extraocular Movements: Extraocular movements intact.     Conjunctiva/sclera: Conjunctivae normal.     Pupils: Pupils are equal, round, and reactive to light.  Cardiovascular:     Rate and Rhythm: Normal rate and regular rhythm.     Pulses: Normal pulses.     Heart sounds: Normal heart sounds.  Pulmonary:     Effort: Pulmonary effort is normal. No respiratory distress.     Breath sounds: Wheezing present. No rales.  Musculoskeletal:  General: Normal range of motion.     Cervical back: Normal range of motion and neck supple.  Skin:    General: Skin is warm and dry.     Capillary Refill: Capillary refill takes less than 2 seconds.  Neurological:     General: No focal deficit present.     Mental Status: She is alert and oriented to person, place, and time.  Psychiatric:        Mood and Affect: Mood normal.        Behavior: Behavior normal.     Results for orders placed or performed in visit on 07/13/19 (from the past 24 hour(s))  POCT glucose (manual entry)     Status: Abnormal   Collection Time: 07/13/19  8:13 AM  Result Value Ref Range   POC Glucose 105 (A) 70 - 99 mg/dl  POCT glycosylated hemoglobin (Hb A1C)     Status: Abnormal   Collection Time: 07/13/19  8:19 AM  Result Value Ref Range   Hemoglobin A1C 6.3 (A) 4.0 - 5.6 %   HbA1c POC (<> result, manual entry)     HbA1c, POC (prediabetic range)     HbA1c, POC (controlled diabetic range)     A total of 45 minutes was spent with the patient, greater than 50% of which was in counseling/coordination of care regarding chronic medical problems including hypertension, diabetes and asthma, management, review of all medications and changes, new asthma medication and new blood pressure medication, review of most recent office visit notes, review of most recent blood work results including today's hemoglobin A1c, cardiovascular risks  associated with diabetes and hypertension, diet and nutrition, prognosis and need for follow-up in 3 months.  ASSESSMENT & PLAN: Hypertension associated with diabetes ([place]) Uncontrolled hypertension. Continue amlodipine 5 mg daily and change losartan to losartan-HCTZ 100-12.5 mg daily. Continue monitoring blood pressure readings at home. Diabetes well controlled with hemoglobin A1c of 6.3. Continue Metformin 1000 mg twice a day. Diet and nutrition discussed. Follow-up in 6 months.  Moderate persistent asthma with acute exacerbation Chronic asthma not well controlled. Using rescue inhaler too frequently. Needs maintenance therapy with Symbicort twice a day. Will start prednisone 40 mg daily for 5 days.  Rozena was seen today for medical management of chronic issues, diabetes, hypertension and hyperlipidemia.  Diagnoses and all orders for this visit:  Hypertension associated with diabetes ([place]) -     CBC with Differential/Platelet -     Comprehensive metabolic panel -     losartan-hydrochlorothiazide (HYZAAR) 100-12.5 MG tablet; Take 1 tablet by mouth daily.  Dyslipidemia associated with type 2 diabetes mellitus ([facility]) -     POCT glucose (manual entry) -     POCT glycosylated hemoglobin (Hb A1C) -     Lipid panel  Moderate persistent asthma with acute exacerbation -     methylPREDNISolone sodium succinate (SOLU-MEDROL) 125 mg/2 mL injection 125 mg -     predniSONE (DELTASONE) 20 MG tablet; Take 2 tablets (40 mg total) by mouth daily with breakfast for 5 days. -     budesonide-formoterol (SYMBICORT) 80-4.5 MCG/ACT inhaler; Inhale 2 puffs into the lungs 2 (two) times daily. -     albuterol (PROVENTIL) (2.5 MG/3ML) 0.083% nebulizer solution; Take 3 mLs (2.5 mg total) by nebulization every 6 (six) hours as needed for wheezing or shortness of breath.    Patient Instructions       If you have lab work done today you will be contacted with your lab results within  the next 2 weeks.  If you  have not heard from [place] then please contact us. The fastest way to get your results is to register for My Chart.   IF you received an x-ray today, you will receive an invoice from [facility]. Please contact [facility] at [phone] with questions or concerns regarding your invoice.   IF you received labwork today, you will receive an invoice from [place] [facility]. Please contact [facility] at [phone] with questions or concerns regarding your invoice.   Our billing staff will not be able to assist you with questions regarding bills from these companies.  You will be contacted with the lab results as soon as they are available. The fastest way to get your results is to activate your My Chart account. Instructions are located on the last page of this paperwork. If you have not heard from [place] regarding the results in 2 weeks, please contact this office.      Asthma, Adult  Asthma is a long-term (chronic) condition in which the airways get tight and narrow. The airways are the breathing passages that lead from the nose and mouth down into the lungs. A person with asthma will have times when symptoms get worse. These are called asthma attacks. They can cause coughing, whistling sounds when you breathe (wheezing), shortness of breath, and chest pain. They can make it hard to breathe. There is no cure for asthma, but medicines and lifestyle changes can help control it. There are many things that can bring on an asthma attack or make asthma symptoms worse (triggers). Common triggers include:  Mold.  Dust.  Cigarette smoke.  Cockroaches.  Things that can cause allergy symptoms (allergens). These include animal skin flakes (dander) and pollen from trees or grass.  Things that pollute the air. These may include household cleaners, wood smoke, smog, or chemical odors.  Cold air, weather changes, and wind.  Crying or laughing hard.  Stress.  Certain medicines or  drugs.  Certain foods such as dried fruit, potato chips, and grape juice.  Infections, such as a cold or the flu.  Certain medical conditions or diseases.  Exercise or tiring activities. Asthma may be treated with medicines and by staying away from the things that cause asthma attacks. Types of medicines may include:  Controller medicines. These help prevent asthma symptoms. They are usually taken every day.  Fast-acting reliever or rescue medicines. These quickly relieve asthma symptoms. They are used as needed and provide short-term relief.  Allergy medicines if your attacks are brought on by allergens.  Medicines to help control the body's defense (immune) system. Follow these instructions at home: Avoiding triggers in your home  Change your heating and air conditioning filter often.  Limit your use of fireplaces and wood stoves.  Get rid of pests (such as roaches and mice) and their droppings.  Throw away plants if you see mold on them.  Clean your floors. Dust regularly. Use cleaning products that do not smell.  Have someone vacuum when you are not home. Use a vacuum cleaner with a HEPA filter if possible.  Replace carpet with wood, tile, or vinyl flooring. Carpet can trap animal skin flakes and dust.  Use allergy-proof pillows, mattress covers, and box spring covers.  Wash bed sheets and blankets every week in hot water. Dry them in a dryer.  Keep your bedroom free of any triggers.  Avoid pets and keep windows closed when things that cause allergy symptoms are in the air.  Use blankets that are made of polyester or cotton.  Clean bathrooms and kitchens with bleach. If possible, have someone repaint the walls in these rooms with mold-resistant paint. Keep out of the rooms that are being cleaned and painted.  Wash your hands often with soap and water. If soap and water are not available, use hand sanitizer.  Do not allow anyone to smoke in your home. General  instructions  Take over-the-counter and prescription medicines only as told by your doctor. ? Talk with your doctor if you have questions about how or when to take your medicines. ? Make note if you need to use your medicines more often than usual.  Do not use any products that contain nicotine or tobacco, such as cigarettes and e-cigarettes. If you need help quitting, ask your doctor.  Stay away from secondhand smoke.  Avoid doing things outdoors when allergen counts are high and when air quality is low.  Wear a ski mask when doing outdoor activities in the winter. The mask should cover your nose and mouth. Exercise indoors on cold days if you can.  Warm up before you exercise. Take time to cool down after exercise.  Use a peak flow meter as told by your doctor. A peak flow meter is a tool that measures how well the lungs are working.  Keep track of the peak flow meter's readings. Write them down.  Follow your asthma action plan. This is a written plan for taking care of your asthma and treating your attacks.  Make sure you get all the shots (vaccines) that your doctor recommends. Ask your doctor about a flu shot and a pneumonia shot.  Keep all follow-up visits as told by your doctor. This is important. Contact a doctor if:  You have wheezing, shortness of breath, or a cough even while taking medicine to prevent attacks.  The mucus you cough up (sputum) is thicker than usual.  The mucus you cough up changes from clear or white to yellow, green, gray, or bloody.  You have problems from the medicine you are taking, such as: ? A rash. ? Itching. ? Swelling. ? Trouble breathing.  You need reliever medicines more than 2-3 times a week.  Your peak flow reading is still at 50-79% of your personal best after following the action plan for 1 hour.  You have a fever. Get help right away if:  You seem to be worse and are not responding to medicine during an asthma attack.  You  are short of breath even at rest.  You get short of breath when doing very little activity.  You have trouble eating, drinking, or talking.  You have chest pain or tightness.  You have a fast heartbeat.  Your lips or fingernails start to turn blue.  You are light-headed or dizzy, or you faint.  Your peak flow is less than 50% of your personal best.  You feel too tired to breathe normally. Summary  Asthma is a long-term (chronic) condition in which the airways get tight and narrow. An asthma attack can make it hard to breathe.  Asthma cannot be cured, but medicines and lifestyle changes can help control it.  Make sure you understand how to avoid triggers and how and when to use your medicines. This information is not intended to replace advice given to you by your health care provider. Make sure you discuss any questions you have with your health care provider. Document Revised: 03/25/2018 Document Reviewed: 02/25/2016 Elsevier Patient  Education  [employer].  Hypertension, Adult High blood pressure (hypertension) is when the force of blood pumping through the arteries is too strong. The arteries are the blood vessels that carry blood from the heart throughout the body. Hypertension forces the heart to work harder to pump blood and may cause arteries to become narrow or stiff. Untreated or uncontrolled hypertension can cause a heart attack, heart failure, a stroke, kidney disease, and other problems. A blood pressure reading consists of a higher number over a lower number. Ideally, your blood pressure should be below 120/80. The first ("top") number is called the systolic pressure. It is a measure of the pressure in your arteries as your heart beats. The second ("bottom") number is called the diastolic pressure. It is a measure of the pressure in your arteries as the heart relaxes. What are the causes? The exact cause of this condition is not known. There are some conditions  that result in or are related to high blood pressure. What increases the risk? Some risk factors for high blood pressure are under your control. The following factors may make you more likely to develop this condition:  Smoking.  Having type 2 diabetes mellitus, high cholesterol, or both.  Not getting enough exercise or physical activity.  Being overweight.  Having too much fat, sugar, calories, or salt (sodium) in your diet.  Drinking too much alcohol. Some risk factors for high blood pressure may be difficult or impossible to change. Some of these factors include:  Having chronic kidney disease.  Having a family history of high blood pressure.  Age. Risk increases with age.  Race. You may be at higher risk if you are African American.  Gender. Men are at higher risk than women before age 9. After age 48, women are at higher risk than men.  Having obstructive sleep apnea.  Stress. What are the signs or symptoms? High blood pressure may not cause symptoms. Very high blood pressure (hypertensive crisis) may cause:  Headache.  Anxiety.  Shortness of breath.  Nosebleed.  Nausea and vomiting.  Vision changes.  Severe chest pain.  Seizures. How is this diagnosed? This condition is diagnosed by measuring your blood pressure while you are seated, with your arm resting on a flat surface, your legs uncrossed, and your feet flat on the floor. The cuff of the blood pressure monitor will be placed directly against the skin of your upper arm at the level of your heart. It should be measured at least twice using the same arm. Certain conditions can cause a difference in blood pressure between your right and left arms. Certain factors can cause blood pressure readings to be lower or higher than normal for a short period of time:  When your blood pressure is higher when you are in a health care provider's office than when you are at home, this is called white coat hypertension.  Most people with this condition do not need medicines.  When your blood pressure is higher at home than when you are in a health care provider's office, this is called masked hypertension. Most people with this condition may need medicines to control blood pressure. If you have a high blood pressure reading during one visit or you have normal blood pressure with other risk factors, you may be asked to:  Return on a different day to have your blood pressure checked again.  Monitor your blood pressure at home for 1 week or longer. If you are diagnosed with  hypertension, you may have other blood or imaging tests to help your health care provider understand your overall risk for other conditions. How is this treated? This condition is treated by making healthy lifestyle changes, such as eating healthy foods, exercising more, and reducing your alcohol intake. Your health care provider may prescribe medicine if lifestyle changes are not enough to get your blood pressure under control, and if:  Your systolic blood pressure is above 130.  Your diastolic blood pressure is above 80. Your personal target blood pressure may vary depending on your medical conditions, your age, and other factors. Follow these instructions at home: Eating and drinking   Eat a diet that is high in fiber and potassium, and low in sodium, added sugar, and fat. An example eating plan is called the DASH (Dietary Approaches to Stop Hypertension) diet. To eat this way: ? Eat plenty of fresh fruits and vegetables. Try to fill one half of your plate at each meal with fruits and vegetables. ? Eat whole grains, such as whole-wheat pasta, brown rice, or whole-grain bread. Fill about one fourth of your plate with whole grains. ? Eat or drink low-fat dairy products, such as skim milk or low-fat yogurt. ? Avoid fatty cuts of meat, processed or cured meats, and poultry with skin. Fill about one fourth of your plate with lean proteins, such  as fish, chicken without skin, beans, eggs, or tofu. ? Avoid pre-made and processed foods. These tend to be higher in sodium, added sugar, and fat.  Reduce your daily sodium intake. Most people with hypertension should eat less than 1,500 mg of sodium a day.  Do not drink alcohol if: ? Your health care provider tells you not to drink. ? You are pregnant, may be pregnant, or are planning to become pregnant.  If you drink alcohol: ? Limit how much you use to:  0-1 drink a day for women.  0-2 drinks a day for men. ? Be aware of how much alcohol is in your drink. In the [place] one drink equals one 12 oz bottle of beer (355 mL), one 5 oz glass of wine (148 mL), or one 1 oz glass of hard liquor (44 mL). Lifestyle   Work with your health care provider to maintain a healthy body weight or to lose weight. Ask what an ideal weight is for you.  Get at least 30 minutes of exercise most days of the week. Activities may include walking, swimming, or biking.  Include exercise to strengthen your muscles (resistance exercise), such as Pilates or lifting weights, as part of your weekly exercise routine. Try to do these types of exercises for 30 minutes at least 3 days a week.  Do not use any products that contain nicotine or tobacco, such as cigarettes, e-cigarettes, and chewing tobacco. If you need help quitting, ask your health care provider.  Monitor your blood pressure at home as told by your health care provider.  Keep all follow-up visits as told by your health care provider. This is important. Medicines  Take over-the-counter and prescription medicines only as told by your health care provider. Follow directions carefully. Blood pressure medicines must be taken as prescribed.  Do not skip doses of blood pressure medicine. Doing this puts you at risk for problems and can make the medicine less effective.  Ask your health care provider about side effects or reactions to medicines that you  should watch for. Contact a health care provider if you:  Think you are  having a reaction to a medicine you are taking.  Have headaches that keep coming back (recurring).  Feel dizzy.  Have swelling in your ankles.  Have trouble with your vision. Get help right away if you:  Develop a severe headache or confusion.  Have unusual weakness or numbness.  Feel faint.  Have severe pain in your chest or abdomen.  Vomit repeatedly.  Have trouble breathing. Summary  Hypertension is when the force of blood pumping through your arteries is too strong. If this condition is not controlled, it may put you at risk for serious complications.  Your personal target blood pressure may vary depending on your medical conditions, your age, and other factors. For most people, a normal blood pressure is less than 120/80.  Hypertension is treated with lifestyle changes, medicines, or a combination of both. Lifestyle changes include losing weight, eating a healthy, low-sodium diet, exercising more, and limiting alcohol. This information is not intended to replace advice given to you by your health care provider. Make sure you discuss any questions you have with your health care provider. Document Revised: 09/30/2017 Document Reviewed: 09/30/2017 Elsevier Patient Education  2020 [employer].     Agustina Caroli, MD [facility]

## 2019-07-13 NOTE — Assessment & Plan Note (Signed)
Uncontrolled hypertension. Continue amlodipine 5 mg daily and change losartan to losartan-HCTZ 100-12.5 mg daily. Continue monitoring blood pressure readings at home. Diabetes well controlled with hemoglobin A1c of 6.3. Continue Metformin 1000 mg twice a day. Diet and nutrition discussed. Follow-up in 6 months.

## 2019-07-13 NOTE — Patient Instructions (Addendum)
   If you have lab work done today you will be contacted with your lab results within the next 2 weeks.  If you have not heard from us then please contact us. The fastest way to get your results is to register for My Chart.   IF you received an x-ray today, you will receive an invoice from [facility]. Please contact [facility] at [phone] with questions or concerns regarding your invoice.   IF you received labwork today, you will receive an invoice from [facility]. Please contact [facility] at [phone] with questions or concerns regarding your invoice.   Our billing staff will not be able to assist you with questions regarding bills from these companies.  You will be contacted with the lab results as soon as they are available. The fastest way to get your results is to activate your My Chart account. Instructions are located on the last page of this paperwork. If you have not heard from us regarding the results in 2 weeks, please contact this office.      Asthma, Adult  Asthma is a long-term (chronic) condition in which the airways get tight and narrow. The airways are the breathing passages that lead from the nose and mouth down into the lungs. A person with asthma will have times when symptoms get worse. These are called asthma attacks. They can cause coughing, whistling sounds when you breathe (wheezing), shortness of breath, and chest pain. They can make it hard to breathe. There is no cure for asthma, but medicines and lifestyle changes can help control it. There are many things that can bring on an asthma attack or make asthma symptoms worse (triggers). Common triggers include:  Mold.  Dust.  Cigarette smoke.  Cockroaches.  Things that can cause allergy symptoms (allergens). These include animal skin flakes (dander) and pollen from trees or grass.  Things that pollute the air. These may include household cleaners, wood smoke, smog, or chemical  odors.  Cold air, weather changes, and wind.  Crying or laughing hard.  Stress.  Certain medicines or drugs.  Certain foods such as dried fruit, potato chips, and grape juice.  Infections, such as a cold or the flu.  Certain medical conditions or diseases.  Exercise or tiring activities. Asthma may be treated with medicines and by staying away from the things that cause asthma attacks. Types of medicines may include:  Controller medicines. These help prevent asthma symptoms. They are usually taken every day.  Fast-acting reliever or rescue medicines. These quickly relieve asthma symptoms. They are used as needed and provide short-term relief.  Allergy medicines if your attacks are brought on by allergens.  Medicines to help control the body's defense (immune) system. Follow these instructions at home: Avoiding triggers in your home  Change your heating and air conditioning filter often.  Limit your use of fireplaces and wood stoves.  Get rid of pests (such as roaches and mice) and their droppings.  Throw away plants if you see mold on them.  Clean your floors. Dust regularly. Use cleaning products that do not smell.  Have someone vacuum when you are not home. Use a vacuum cleaner with a HEPA filter if possible.  Replace carpet with wood, tile, or vinyl flooring. Carpet can trap animal skin flakes and dust.  Use allergy-proof pillows, mattress covers, and box spring covers.  Wash bed sheets and blankets every week in hot water. Dry them in a dryer.  Keep your bedroom free of any triggers.    Avoid pets and keep windows closed when things that cause allergy symptoms are in the air.  Use blankets that are made of polyester or cotton.  Clean bathrooms and kitchens with bleach. If possible, have someone repaint the walls in these rooms with mold-resistant paint. Keep out of the rooms that are being cleaned and painted.  Wash your hands often with soap and water. If  soap and water are not available, use hand sanitizer.  Do not allow anyone to smoke in your home. General instructions  Take over-the-counter and prescription medicines only as told by your doctor. ? Talk with your doctor if you have questions about how or when to take your medicines. ? Make note if you need to use your medicines more often than usual.  Do not use any products that contain nicotine or tobacco, such as cigarettes and e-cigarettes. If you need help quitting, ask your doctor.  Stay away from secondhand smoke.  Avoid doing things outdoors when allergen counts are high and when air quality is low.  Wear a ski mask when doing outdoor activities in the winter. The mask should cover your nose and mouth. Exercise indoors on cold days if you can.  Warm up before you exercise. Take time to cool down after exercise.  Use a peak flow meter as told by your doctor. A peak flow meter is a tool that measures how well the lungs are working.  Keep track of the peak flow meter's readings. Write them down.  Follow your asthma action plan. This is a written plan for taking care of your asthma and treating your attacks.  Make sure you get all the shots (vaccines) that your doctor recommends. Ask your doctor about a flu shot and a pneumonia shot.  Keep all follow-up visits as told by your doctor. This is important. Contact a doctor if:  You have wheezing, shortness of breath, or a cough even while taking medicine to prevent attacks.  The mucus you cough up (sputum) is thicker than usual.  The mucus you cough up changes from clear or white to yellow, green, gray, or bloody.  You have problems from the medicine you are taking, such as: ? A rash. ? Itching. ? Swelling. ? Trouble breathing.  You need reliever medicines more than 2-3 times a week.  Your peak flow reading is still at 50-79% of your personal best after following the action plan for 1 hour.  You have a fever. Get help  right away if:  You seem to be worse and are not responding to medicine during an asthma attack.  You are short of breath even at rest.  You get short of breath when doing very little activity.  You have trouble eating, drinking, or talking.  You have chest pain or tightness.  You have a fast heartbeat.  Your lips or fingernails start to turn blue.  You are light-headed or dizzy, or you faint.  Your peak flow is less than 50% of your personal best.  You feel too tired to breathe normally. Summary  Asthma is a long-term (chronic) condition in which the airways get tight and narrow. An asthma attack can make it hard to breathe.  Asthma cannot be cured, but medicines and lifestyle changes can help control it.  Make sure you understand how to avoid triggers and how and when to use your medicines. This information is not intended to replace advice given to you by your health care provider. Make sure you discuss   any questions you have with your health care provider. Document Revised: 03/25/2018 Document Reviewed: 02/25/2016 Elsevier Patient Education  [place].  Hypertension, Adult High blood pressure (hypertension) is when the force of blood pumping through the arteries is too strong. The arteries are the blood vessels that carry blood from the heart throughout the body. Hypertension forces the heart to work harder to pump blood and may cause arteries to become narrow or stiff. Untreated or uncontrolled hypertension can cause a heart attack, heart failure, a stroke, kidney disease, and other problems. A blood pressure reading consists of a higher number over a lower number. Ideally, your blood pressure should be below 120/80. The first ("top") number is called the systolic pressure. It is a measure of the pressure in your arteries as your heart beats. The second ("bottom") number is called the diastolic pressure. It is a measure of the pressure in your arteries as the heart  relaxes. What are the causes? The exact cause of this condition is not known. There are some conditions that result in or are related to high blood pressure. What increases the risk? Some risk factors for high blood pressure are under your control. The following factors may make you more likely to develop this condition:  Smoking.  Having type 2 diabetes mellitus, high cholesterol, or both.  Not getting enough exercise or physical activity.  Being overweight.  Having too much fat, sugar, calories, or salt (sodium) in your diet.  Drinking too much alcohol. Some risk factors for high blood pressure may be difficult or impossible to change. Some of these factors include:  Having chronic kidney disease.  Having a family history of high blood pressure.  Age. Risk increases with age.  Race. You may be at higher risk if you are African American.  Gender. Men are at higher risk than women before age 27. After age 31, women are at higher risk than men.  Having obstructive sleep apnea.  Stress. What are the signs or symptoms? High blood pressure may not cause symptoms. Very high blood pressure (hypertensive crisis) may cause:  Headache.  Anxiety.  Shortness of breath.  Nosebleed.  Nausea and vomiting.  Vision changes.  Severe chest pain.  Seizures. How is this diagnosed? This condition is diagnosed by measuring your blood pressure while you are seated, with your arm resting on a flat surface, your legs uncrossed, and your feet flat on the floor. The cuff of the blood pressure monitor will be placed directly against the skin of your upper arm at the level of your heart. It should be measured at least twice using the same arm. Certain conditions can cause a difference in blood pressure between your right and left arms. Certain factors can cause blood pressure readings to be lower or higher than normal for a short period of time:  When your blood pressure is higher when you  are in a health care provider's office than when you are at home, this is called white coat hypertension. Most people with this condition do not need medicines.  When your blood pressure is higher at home than when you are in a health care provider's office, this is called masked hypertension. Most people with this condition may need medicines to control blood pressure. If you have a high blood pressure reading during one visit or you have normal blood pressure with other risk factors, you may be asked to:  Return on a different day to have your blood pressure checked again.  Monitor your blood pressure at home for 1 week or longer. If you are diagnosed with hypertension, you may have other blood or imaging tests to help your health care provider understand your overall risk for other conditions. How is this treated? This condition is treated by making healthy lifestyle changes, such as eating healthy foods, exercising more, and reducing your alcohol intake. Your health care provider may prescribe medicine if lifestyle changes are not enough to get your blood pressure under control, and if:  Your systolic blood pressure is above 130.  Your diastolic blood pressure is above 80. Your personal target blood pressure may vary depending on your medical conditions, your age, and other factors. Follow these instructions at home: Eating and drinking   Eat a diet that is high in fiber and potassium, and low in sodium, added sugar, and fat. An example eating plan is called the DASH (Dietary Approaches to Stop Hypertension) diet. To eat this way: ? Eat plenty of fresh fruits and vegetables. Try to fill one half of your plate at each meal with fruits and vegetables. ? Eat whole grains, such as whole-wheat pasta, brown rice, or whole-grain bread. Fill about one fourth of your plate with whole grains. ? Eat or drink low-fat dairy products, such as skim milk or low-fat yogurt. ? Avoid fatty cuts of meat,  processed or cured meats, and poultry with skin. Fill about one fourth of your plate with lean proteins, such as fish, chicken without skin, beans, eggs, or tofu. ? Avoid pre-made and processed foods. These tend to be higher in sodium, added sugar, and fat.  Reduce your daily sodium intake. Most people with hypertension should eat less than 1,500 mg of sodium a day.  Do not drink alcohol if: ? Your health care provider tells you not to drink. ? You are pregnant, may be pregnant, or are planning to become pregnant.  If you drink alcohol: ? Limit how much you use to:  0-1 drink a day for women.  0-2 drinks a day for men. ? Be aware of how much alcohol is in your drink. In the [place] one drink equals one 12 oz bottle of beer (355 mL), one 5 oz glass of wine (148 mL), or one 1 oz glass of hard liquor (44 mL). Lifestyle   Work with your health care provider to maintain a healthy body weight or to lose weight. Ask what an ideal weight is for you.  Get at least 30 minutes of exercise most days of the week. Activities may include walking, swimming, or biking.  Include exercise to strengthen your muscles (resistance exercise), such as Pilates or lifting weights, as part of your weekly exercise routine. Try to do these types of exercises for 30 minutes at least 3 days a week.  Do not use any products that contain nicotine or tobacco, such as cigarettes, e-cigarettes, and chewing tobacco. If you need help quitting, ask your health care provider.  Monitor your blood pressure at home as told by your health care provider.  Keep all follow-up visits as told by your health care provider. This is important. Medicines  Take over-the-counter and prescription medicines only as told by your health care provider. Follow directions carefully. Blood pressure medicines must be taken as prescribed.  Do not skip doses of blood pressure medicine. Doing this puts you at risk for problems and can make the  medicine less effective.  Ask your health care provider about side effects or reactions to medicines  that you should watch for. Contact a health care provider if you:  Think you are having a reaction to a medicine you are taking.  Have headaches that keep coming back (recurring).  Feel dizzy.  Have swelling in your ankles.  Have trouble with your vision. Get help right away if you:  Develop a severe headache or confusion.  Have unusual weakness or numbness.  Feel faint.  Have severe pain in your chest or abdomen.  Vomit repeatedly.  Have trouble breathing. Summary  Hypertension is when the force of blood pumping through your arteries is too strong. If this condition is not controlled, it may put you at risk for serious complications.  Your personal target blood pressure may vary depending on your medical conditions, your age, and other factors. For most people, a normal blood pressure is less than 120/80.  Hypertension is treated with lifestyle changes, medicines, or a combination of both. Lifestyle changes include losing weight, eating a healthy, low-sodium diet, exercising more, and limiting alcohol. This information is not intended to replace advice given to you by your health care provider. Make sure you discuss any questions you have with your health care provider. Document Revised: 09/30/2017 Document Reviewed: 09/30/2017 Elsevier Patient Education  2020 [employer].

## 2019-08-17 ENCOUNTER — Encounter: Payer: Self-pay | Admitting: Emergency Medicine

## 2019-10-12 ENCOUNTER — Ambulatory Visit (INDEPENDENT_AMBULATORY_CARE_PROVIDER_SITE_OTHER): Payer: BC Managed Care – PPO | Admitting: Emergency Medicine

## 2019-10-12 ENCOUNTER — Other Ambulatory Visit: Payer: Self-pay

## 2019-10-12 ENCOUNTER — Encounter: Payer: Self-pay | Admitting: Emergency Medicine

## 2019-10-12 VITALS — BP 136/87 | HR 84 | Temp 97.9°F | Resp 16 | Ht 62.0 in | Wt 220.0 lb

## 2019-10-12 DIAGNOSIS — G8929 Other chronic pain: Secondary | ICD-10-CM

## 2019-10-12 DIAGNOSIS — M2550 Pain in unspecified joint: Secondary | ICD-10-CM

## 2019-10-12 DIAGNOSIS — E119 Type 2 diabetes mellitus without complications: Secondary | ICD-10-CM

## 2019-10-12 DIAGNOSIS — E1159 Type 2 diabetes mellitus with other circulatory complications: Secondary | ICD-10-CM

## 2019-10-12 DIAGNOSIS — J4541 Moderate persistent asthma with (acute) exacerbation: Secondary | ICD-10-CM

## 2019-10-12 DIAGNOSIS — E785 Hyperlipidemia, unspecified: Secondary | ICD-10-CM

## 2019-10-12 DIAGNOSIS — Z6841 Body Mass Index (BMI) 40.0 and over, adult: Secondary | ICD-10-CM | POA: Insufficient documentation

## 2019-10-12 DIAGNOSIS — E1169 Type 2 diabetes mellitus with other specified complication: Secondary | ICD-10-CM

## 2019-10-12 DIAGNOSIS — I10 Essential (primary) hypertension: Secondary | ICD-10-CM

## 2019-10-12 LAB — POCT GLYCOSYLATED HEMOGLOBIN (HGB A1C): Hemoglobin A1C: 6.7 % — AB (ref 4.0–5.6)

## 2019-10-12 LAB — GLUCOSE, POCT (MANUAL RESULT ENTRY): POC Glucose: 120 mg/dl — AB (ref 70–99)

## 2019-10-12 NOTE — Patient Instructions (Addendum)
   If you have lab work done today you will be contacted with your lab results within the next 2 weeks.  If you have not heard from us then please contact us. The fastest way to get your results is to register for My Chart.   IF you received an x-ray today, you will receive an invoice from [facility]. Please contact [facility] at [phone] with questions or concerns regarding your invoice.   IF you received labwork today, you will receive an invoice from [facility]. Please contact [facility] at [phone] with questions or concerns regarding your invoice.   Our billing staff will not be able to assist you with questions regarding bills from these companies.  You will be contacted with the lab results as soon as they are available. The fastest way to get your results is to activate your My Chart account. Instructions are located on the last page of this paperwork. If you have not heard from us regarding the results in 2 weeks, please contact this office.       Health Maintenance, Female Adopting a healthy lifestyle and getting preventive care are important in promoting health and wellness. Ask your health care provider about:  The right schedule for you to have regular tests and exams.  Things you can do on your own to prevent diseases and keep yourself healthy. What should I know about diet, weight, and exercise? Eat a healthy diet   Eat a diet that includes plenty of vegetables, fruits, low-fat dairy products, and lean protein.  Do not eat a lot of foods that are high in solid fats, added sugars, or sodium. Maintain a healthy weight Body mass index (BMI) is used to identify weight problems. It estimates body fat based on height and weight. Your health care provider can help determine your BMI and help you achieve or maintain a healthy weight. Get regular exercise Get regular exercise. This is one of the most important things you can do for your health. Most  adults should:  Exercise for at least 150 minutes each week. The exercise should increase your heart rate and make you sweat (moderate-intensity exercise).  Do strengthening exercises at least twice a week. This is in addition to the moderate-intensity exercise.  Spend less time sitting. Even light physical activity can be beneficial. Watch cholesterol and blood lipids Have your blood tested for lipids and cholesterol at 63 years of age, then have this test every 5 years. Have your cholesterol levels checked more often if:  Your lipid or cholesterol levels are high.  You are older than 63 years of age.  You are at high risk for heart disease. What should I know about cancer screening? Depending on your health history and family history, you may need to have cancer screening at various ages. This may include screening for:  Breast cancer.  Cervical cancer.  Colorectal cancer.  Skin cancer.  Lung cancer. What should I know about heart disease, diabetes, and high blood pressure? Blood pressure and heart disease  High blood pressure causes heart disease and increases the risk of stroke. This is more likely to develop in people who have high blood pressure readings, are of African descent, or are overweight.  Have your blood pressure checked: ? Every 3-5 years if you are 18-39 years of age. ? Every year if you are 40 years old or older. Diabetes Have regular diabetes screenings. This checks your fasting blood sugar level. Have the screening done:  Once   every three years after age 40 if you are at a normal weight and have a low risk for diabetes.  More often and at a younger age if you are overweight or have a high risk for diabetes. What should I know about preventing infection? Hepatitis B If you have a higher risk for hepatitis B, you should be screened for this virus. Talk with your health care provider to find out if you are at risk for hepatitis B infection. Hepatitis  C Testing is recommended for:  Everyone born from 1945 through 1965.  Anyone with known risk factors for hepatitis C. Sexually transmitted infections (STIs)  Get screened for STIs, including gonorrhea and chlamydia, if: ? You are sexually active and are younger than 63 years of age. ? You are older than 63 years of age and your health care provider tells you that you are at risk for this type of infection. ? Your sexual activity has changed since you were last screened, and you are at increased risk for chlamydia or gonorrhea. Ask your health care provider if you are at risk.  Ask your health care provider about whether you are at high risk for HIV. Your health care provider may recommend a prescription medicine to help prevent HIV infection. If you choose to take medicine to prevent HIV, you should first get tested for HIV. You should then be tested every 3 months for as long as you are taking the medicine. Pregnancy  If you are about to stop having your period (premenopausal) and you may become pregnant, seek counseling before you get pregnant.  Take 400 to 800 micrograms (mcg) of folic acid every day if you become pregnant.  Ask for birth control (contraception) if you want to prevent pregnancy. Osteoporosis and menopause Osteoporosis is a disease in which the bones lose minerals and strength with aging. This can result in bone fractures. If you are 65 years old or older, or if you are at risk for osteoporosis and fractures, ask your health care provider if you should:  Be screened for bone loss.  Take a calcium or vitamin D supplement to lower your risk of fractures.  Be given hormone replacement therapy (HRT) to treat symptoms of menopause. Follow these instructions at home: Lifestyle  Do not use any products that contain nicotine or tobacco, such as cigarettes, e-cigarettes, and chewing tobacco. If you need help quitting, ask your health care provider.  Do not use street  drugs.  Do not share needles.  Ask your health care provider for help if you need support or information about quitting drugs. Alcohol use  Do not drink alcohol if: ? Your health care provider tells you not to drink. ? You are pregnant, may be pregnant, or are planning to become pregnant.  If you drink alcohol: ? Limit how much you use to 0-1 drink a day. ? Limit intake if you are breastfeeding.  Be aware of how much alcohol is in your drink. In the [place] one drink equals one 12 oz bottle of beer (355 mL), one 5 oz glass of wine (148 mL), or one 1 oz glass of hard liquor (44 mL). General instructions  Schedule regular health, dental, and eye exams.  Stay current with your vaccines.  Tell your health care provider if: ? You often feel depressed. ? You have ever been abused or do not feel safe at home. Summary  Adopting a healthy lifestyle and getting preventive care are important in promoting health and   wellness.  Follow your health care provider's instructions about healthy diet, exercising, and getting tested or screened for diseases.  Follow your health care provider's instructions on monitoring your cholesterol and blood pressure. This information is not intended to replace advice given to you by your health care provider. Make sure you discuss any questions you have with your health care provider. Document Revised: 01/13/2018 Document Reviewed: 01/13/2018 Elsevier Patient Education  2020 Else[employer] Inc.  

## 2019-10-12 NOTE — Assessment & Plan Note (Signed)
Well-controlled hypertension.  Continue present medication.  No changes. Well-controlled diabetes with hemoglobin A1c of 6.7.  Continue present medication, Metformin twice a day.  Diet and nutrition discussed. Continue statin therapy with atorvastatin 40 mg daily. Follow-up in 3 to 6 months.

## 2019-10-12 NOTE — Assessment & Plan Note (Signed)
Well-controlled asthma on Symbicort twice a day.

## 2019-10-12 NOTE — Progress Notes (Signed)
Catherine Cline 64 y.o.   Chief Complaint  Patient presents with  . Diabetes    follow up 3 month  . Medication Refill    Amlodipine, Atorvastatin, Metformin and Losartan-Hctz    HISTORY OF PRESENT ILLNESS: This is a 64 y.o. female with history of hypertension, diabetes, dyslipidemia, and asthma here for follow-up. Doing well.  Has no complaints or medical concerns today. 1.  Diabetes: On Metformin 1000 mg twice a day.  Blood sugar at home 130s. 2.  Hypertension: On amlodipine 5 mg daily and Hyzaar 100-12.5 mg daily.  Normal blood pressure readings at home. 3.  Dyslipidemia: On atorvastatin 40 mg daily. 4.  Asthma: Well-controlled on Symbicort twice a day.  Has not had to use albuterol rescue inhaler.  Doing well. Fully vaccinated against Covid.  HPI   Prior to Admission medications   Medication Sig Start Date End Date Taking? Authorizing Provider  albuterol (VENTOLIN HFA) 108 (90 Base) MCG/ACT inhaler Inhale 2 puffs into the lungs every 6 (six) hours as needed for wheezing or shortness of breath. 01/12/19  Yes Celestia Duva, Ines Bloomer, MD  amLODipine (NORVASC) 5 MG tablet 5 mg.   Yes [provider]  aspirin EC 81 MG tablet Take 81 mg by mouth in the morning and at bedtime.    Yes [provider]  atorvastatin (LIPITOR) 40 MG tablet Take 1 tablet (40 mg total) by mouth daily. 01/12/19  Yes Klinton Candelas, Ines Bloomer, MD  budesonide-formoterol [facility]) 80-4.5 MCG/ACT inhaler Inhale 2 puffs into the lungs 2 (two) times daily. 07/13/19  Yes Tallyn Holroyd, Ines Bloomer, MD  Cholecalciferol (VITAMIN D3) 5000 units CAPS Take by mouth daily.   Yes [provider]  Levocetirizine Dihydrochloride (XYZAL ALLERGY 24HR PO) Take by mouth.   Yes [provider]  losartan-hydrochlorothiazide (HYZAAR) 100-12.5 MG tablet Take 1 tablet by mouth daily. 07/13/19  Yes Horald Pollen, MD  metFORMIN (GLUCOPHAGE) 1000 MG tablet Take 1 tablet (1,000 mg total) by mouth 2 (two) times  daily with a meal. 01/12/19  Yes Maxx Calaway, Ines Bloomer, MD  Multiple Vitamin (MULTIVITAMIN) tablet Take 1 tablet by mouth daily.   Yes [provider]  albuterol (PROVENTIL) (2.5 MG/3ML) 0.083% nebulizer solution Take 3 mLs (2.5 mg total) by nebulization every 6 (six) hours as needed for wheezing or shortness of breath. 07/13/19   Horald Pollen, MD  montelukast (SINGULAIR) 10 MG tablet Take 1 tablet (10 mg total) by mouth at bedtime. 04/25/19   Lamptey, Myrene Galas, MD    No Known Allergies  Patient Active Problem List   Diagnosis Date Noted  . Moderate persistent asthma with acute exacerbation 07/13/2019  . Primary osteoarthritis involving multiple joints 07/14/2018  . Chronic joint pain 07/14/2018  . Dyslipidemia associated with type 2 diabetes mellitus ([place]) 01/14/2018  . Hypertension associated with diabetes ([place]) 07/07/2017  . Hyperlipidemia 07/07/2017    Past Medical History:  Diagnosis Date  . Arthritis   . Asthma   . Hypertension     Past Surgical History:  Procedure Laterality Date  . OVARIAN CYST REMOVAL      Social History   Socioeconomic History  . Marital status: Single    Spouse name: Not on file  . Number of children: Not on file  . Years of education: Not on file  . Highest education level: Not on file  Occupational History  . Not on file  Tobacco Use  . Smoking status: Never Smoker  . Smokeless tobacco: Never Used  Substance and Sexual Activity  . Alcohol use: Not Currently    Comment: socially  . Drug use: Never  . Sexual activity: Not on file  Other Topics Concern  . Not on file  Social History Narrative  . Not on file   Social Determinants of Health   Financial Resource Strain:   . Difficulty of Paying Living Expenses: Not on file  Food Insecurity:   . Worried About Charity fundraiser in the Last Year: Not on file  . Ran Out of Food in the Last Year: Not on file  Transportation Needs:   . Lack of Transportation (Medical): Not  on file  . Lack of Transportation (Non-Medical): Not on file  Physical Activity:   . Days of Exercise per Week: Not on file  . Minutes of Exercise per Session: Not on file  Stress:   . Feeling of Stress : Not on file  Social Connections:   . Frequency of Communication with Friends and Family: Not on file  . Frequency of Social Gatherings with Friends and Family: Not on file  . Attends Religious Services: Not on file  . Active Member of Clubs or Organizations: Not on file  . Attends Archivist Meetings: Not on file  . Marital Status: Not on file  Intimate Partner Violence:   . Fear of Current or Ex-Partner: Not on file  . Emotionally Abused: Not on file  . Physically Abused: Not on file  . Sexually Abused: Not on file    Family History  Problem Relation Age of Onset  . Heart disease Mother   . Hypertension Mother   . Stroke Mother   . Diabetes Sister   . Hypertension Sister      Review of Systems  Constitutional: Negative.  Negative for chills and fever.  HENT: Negative.  Negative for congestion and sore throat.   Respiratory: Negative.  Negative for cough and shortness of breath.   Cardiovascular: Negative.  Negative for chest pain and palpitations.  Gastrointestinal: Negative.  Negative for abdominal pain, diarrhea, nausea and vomiting.  Genitourinary: Negative for dysuria.  Musculoskeletal: Negative.  Negative for back pain, myalgias and neck pain.  Skin: Negative.  Negative for rash.  Neurological: Negative for headaches.  All other systems reviewed and are negative.    Today's Vitals   10/12/19 0820  BP: 136/87  Pulse: 84  Resp: 16  Temp: 97.9 F (36.6 C)  TempSrc: Temporal  SpO2: 95%  Weight: 220 lb (99.8 kg)  Height: 5\' 2"  (1.575 m)   Body mass index is 40.24 kg/m. Wt Readings from Last 3 Encounters:  10/12/19 220 lb (99.8 kg)  07/13/19 211 lb (95.7 kg)  04/25/19 215 lb (97.5 kg)     Physical Exam Vitals reviewed.  Constitutional:       Appearance: She is obese.  HENT:     Head: Normocephalic.  Eyes:     Extraocular Movements: Extraocular movements intact.     Pupils: Pupils are equal, round, and reactive to light.  Cardiovascular:     Rate and Rhythm: Normal rate and regular rhythm.     Pulses: Normal pulses.     Heart sounds: Normal heart sounds.  Pulmonary:     Effort: Pulmonary effort is normal.     Breath sounds: Normal breath sounds.  Musculoskeletal:        General: Normal range of motion.     Cervical back: Normal range of motion and neck supple.  Skin:  General: Skin is warm and dry.     Capillary Refill: Capillary refill takes less than 2 seconds.  Neurological:     General: No focal deficit present.     Mental Status: She is alert and oriented to person, place, and time.  Psychiatric:        Mood and Affect: Mood normal.        Behavior: Behavior normal.     Results for orders placed or performed in visit on 10/12/19 (from the past 24 hour(s))  POCT glucose (manual entry)     Status: Abnormal   Collection Time: 10/12/19  8:29 AM  Result Value Ref Range   POC Glucose 120 (A) 70 - 99 mg/dl  POCT glycosylated hemoglobin (Hb A1C)     Status: Abnormal   Collection Time: 10/12/19  8:34 AM  Result Value Ref Range   Hemoglobin A1C 6.7 (A) 4.0 - 5.6 %   HbA1c POC (<> result, manual entry)     HbA1c, POC (prediabetic range)     HbA1c, POC (controlled diabetic range)      ASSESSMENT & PLAN: Hypertension associated with diabetes ([place]) Well-controlled hypertension.  Continue present medication.  No changes. Well-controlled diabetes with hemoglobin A1c of 6.7.  Continue present medication, Metformin twice a day.  Diet and nutrition discussed. Continue statin therapy with atorvastatin 40 mg daily. Follow-up in 3 to 6 months.  Moderate persistent asthma with acute exacerbation Well-controlled asthma on Symbicort twice a day.  Debi was seen today for diabetes and medication refill.  Diagnoses and  all orders for this visit:  Hypertension associated with diabetes ([place]) -     Comprehensive metabolic panel  Type 2 diabetes mellitus without complication, without long-term current use of insulin ([facility]) -     POCT glucose (manual entry) -     POCT glycosylated hemoglobin (Hb A1C) -     HM Diabetes Foot Exam  Dyslipidemia associated with type 2 diabetes mellitus ([facility]) -     Lipid panel  Chronic joint pain  Moderate persistent asthma with acute exacerbation  Body mass index (BMI) of 40.1-44.9 in adult [facility])    Patient Instructions       If you have lab work done today you will be contacted with your lab results within the next 2 weeks.  If you have not heard from [place] then please contact us. The fastest way to get your results is to register for My Chart.   IF you received an x-ray today, you will receive an invoice from [facility]. Please contact [facility] at [phone] with questions or concerns regarding your invoice.   IF you received labwork today, you will receive an invoice from [facility]. Please contact [facility] at [phone] with questions or concerns regarding your invoice.   Our billing staff will not be able to assist you with questions regarding bills from these companies.  You will be contacted with the lab results as soon as they are available. The fastest way to get your results is to activate your My Chart account. Instructions are located on the last page of this paperwork. If you have not heard from [place] regarding the results in 2 weeks, please contact this office.     Health Maintenance, Female Adopting a healthy lifestyle and getting preventive care are important in promoting health and wellness. Ask your health care provider about:  The right schedule for you to have regular tests and exams.  Things you can do on your own to  prevent diseases and keep yourself healthy. What should I know about diet, weight, and exercise? Eat a  healthy diet   Eat a diet that includes plenty of vegetables, fruits, low-fat dairy products, and lean protein.  Do not eat a lot of foods that are high in solid fats, added sugars, or sodium. Maintain a healthy weight Body mass index (BMI) is used to identify weight problems. It estimates body fat based on height and weight. Your health care provider can help determine your BMI and help you achieve or maintain a healthy weight. Get regular exercise Get regular exercise. This is one of the most important things you can do for your health. Most adults should:  Exercise for at least 150 minutes each week. The exercise should increase your heart rate and make you sweat (moderate-intensity exercise).  Do strengthening exercises at least twice a week. This is in addition to the moderate-intensity exercise.  Spend less time sitting. Even light physical activity can be beneficial. Watch cholesterol and blood lipids Have your blood tested for lipids and cholesterol at 64 years of age, then have this test every 5 years. Have your cholesterol levels checked more often if:  Your lipid or cholesterol levels are high.  You are older than 63 years of age.  You are at high risk for heart disease. What should I know about cancer screening? Depending on your health history and family history, you may need to have cancer screening at various ages. This may include screening for:  Breast cancer.  Cervical cancer.  Colorectal cancer.  Skin cancer.  Lung cancer. What should I know about heart disease, diabetes, and high blood pressure? Blood pressure and heart disease  High blood pressure causes heart disease and increases the risk of stroke. This is more likely to develop in people who have high blood pressure readings, are of African descent, or are overweight.  Have your blood pressure checked: ? Every 3-5 years if you are 50-60 years of age. ? Every year if you are 59 years old or  older. Diabetes Have regular diabetes screenings. This checks your fasting blood sugar level. Have the screening done:  Once every three years after age 24 if you are at a normal weight and have a low risk for diabetes.  More often and at a younger age if you are overweight or have a high risk for diabetes. What should I know about preventing infection? Hepatitis B If you have a higher risk for hepatitis B, you should be screened for this virus. Talk with your health care provider to find out if you are at risk for hepatitis B infection. Hepatitis C Testing is recommended for:  Everyone born from [birth year] through 1964.  Anyone with known risk factors for hepatitis C. Sexually transmitted infections (STIs)  Get screened for STIs, including gonorrhea and chlamydia, if: ? You are sexually active and are younger than 63 years of age. ? You are older than 63 years of age and your health care provider tells you that you are at risk for this type of infection. ? Your sexual activity has changed since you were last screened, and you are at increased risk for chlamydia or gonorrhea. Ask your health care provider if you are at risk.  Ask your health care provider about whether you are at high risk for HIV. Your health care provider may recommend a prescription medicine to help prevent HIV infection. If you choose to take medicine to prevent HIV, you should  first get tested for HIV. You should then be tested every 3 months for as long as you are taking the medicine. Pregnancy  If you are about to stop having your period (premenopausal) and you may become pregnant, seek counseling before you get pregnant.  Take 400 to 800 micrograms (mcg) of folic acid every day if you become pregnant.  Ask for birth control (contraception) if you want to prevent pregnancy. Osteoporosis and menopause Osteoporosis is a disease in which the bones lose minerals and strength with aging. This can result in bone fractures.  If you are 63 years old or older, or if you are at risk for osteoporosis and fractures, ask your health care provider if you should:  Be screened for bone loss.  Take a calcium or vitamin D supplement to lower your risk of fractures.  Be given hormone replacement therapy (HRT) to treat symptoms of menopause. Follow these instructions at home: Lifestyle  Do not use any products that contain nicotine or tobacco, such as cigarettes, e-cigarettes, and chewing tobacco. If you need help quitting, ask your health care provider.  Do not use street drugs.  Do not share needles.  Ask your health care provider for help if you need support or information about quitting drugs. Alcohol use  Do not drink alcohol if: ? Your health care provider tells you not to drink. ? You are pregnant, may be pregnant, or are planning to become pregnant.  If you drink alcohol: ? Limit how much you use to 0-1 drink a day. ? Limit intake if you are breastfeeding.  Be aware of how much alcohol is in your drink. In the [place] one drink equals one 12 oz bottle of beer (355 mL), one 5 oz glass of wine (148 mL), or one 1 oz glass of hard liquor (44 mL). General instructions  Schedule regular health, dental, and eye exams.  Stay current with your vaccines.  Tell your health care provider if: ? You often feel depressed. ? You have ever been abused or do not feel safe at home. Summary  Adopting a healthy lifestyle and getting preventive care are important in promoting health and wellness.  Follow your health care provider's instructions about healthy diet, exercising, and getting tested or screened for diseases.  Follow your health care provider's instructions on monitoring your cholesterol and blood pressure. This information is not intended to replace advice given to you by your health care provider. Make sure you discuss any questions you have with your health care provider. Document Revised: 01/13/2018  Document Reviewed: 01/13/2018 Elsevier Patient Education  2020 Elsevier Inc.       Agustina Caroli, MD [facility]

## 2019-10-13 LAB — COMPREHENSIVE METABOLIC PANEL WITH GFR
ALT: 75 [IU]/L — ABNORMAL HIGH (ref 0–32)
AST: 34 [IU]/L (ref 0–40)
Albumin/Globulin Ratio: 1.4 (ref 1.2–2.2)
Albumin: 4.3 g/dL (ref 3.8–4.8)
Alkaline Phosphatase: 119 [IU]/L (ref 48–121)
BUN/Creatinine Ratio: 27 (ref 12–28)
BUN: 20 mg/dL (ref 8–27)
Bilirubin Total: 0.2 mg/dL (ref 0.0–1.2)
CO2: 24 mmol/L (ref 20–29)
Calcium: 9.8 mg/dL (ref 8.7–10.3)
Chloride: 101 mmol/L (ref 96–106)
Creatinine, Ser: 0.74 mg/dL (ref 0.57–1.00)
GFR calc Af Amer: 100 mL/min/{1.73_m2}
GFR calc non Af Amer: 86 mL/min/{1.73_m2}
Globulin, Total: 3.1 g/dL (ref 1.5–4.5)
Glucose: 115 mg/dL — ABNORMAL HIGH (ref 65–99)
Potassium: 4.4 mmol/L (ref 3.5–5.2)
Sodium: 141 mmol/L (ref 134–144)
Total Protein: 7.4 g/dL (ref 6.0–8.5)

## 2019-10-13 LAB — LIPID PANEL
Chol/HDL Ratio: 3.2 ratio (ref 0.0–4.4)
Cholesterol, Total: 149 mg/dL (ref 100–199)
HDL: 46 mg/dL (ref >39)
LDL Chol Calc (NIH): 80 mg/dL (ref 0–99)
Triglycerides: 133 mg/dL (ref 0–149)
VLDL Cholesterol Cal: 23 mg/dL (ref 5–40)

## 2019-12-20 ENCOUNTER — Other Ambulatory Visit: Payer: Self-pay

## 2019-12-20 ENCOUNTER — Ambulatory Visit
Admission: RE | Admit: 2019-12-20 | Discharge: 2019-12-20 | Disposition: A | Discharge status: Home / Self Care | Payer: 59 | Source: Ambulatory Visit | Attending: Emergency Medicine | Admitting: Emergency Medicine

## 2019-12-20 DIAGNOSIS — Z1231 Encounter for screening mammogram for malignant neoplasm of breast: Secondary | ICD-10-CM

## 2019-12-26 ENCOUNTER — Other Ambulatory Visit: Payer: Self-pay | Admitting: Emergency Medicine

## 2019-12-26 DIAGNOSIS — R928 Other abnormal and inconclusive findings on diagnostic imaging of breast: Secondary | ICD-10-CM

## 2019-12-27 ENCOUNTER — Encounter: Payer: Self-pay | Admitting: Emergency Medicine

## 2019-12-27 NOTE — Telephone Encounter (Signed)
Pt will follow up after mammo

## 2019-12-28 ENCOUNTER — Telehealth: Payer: Self-pay | Admitting: *Deleted

## 2019-12-28 NOTE — Telephone Encounter (Signed)
Faxed signed order to [facility]. Confirmation page 11:19 am.

## 2020-01-25 ENCOUNTER — Ambulatory Visit: Payer: 59 | Admitting: Emergency Medicine

## 2020-01-25 ENCOUNTER — Other Ambulatory Visit: Payer: Self-pay

## 2020-01-25 ENCOUNTER — Encounter: Payer: Self-pay | Admitting: Emergency Medicine

## 2020-01-25 VITALS — BP 130/80 | HR 91 | Temp 97.8°F | Resp 16 | Ht 62.0 in | Wt 221.0 lb

## 2020-01-25 DIAGNOSIS — E1169 Type 2 diabetes mellitus with other specified complication: Secondary | ICD-10-CM

## 2020-01-25 DIAGNOSIS — M2550 Pain in unspecified joint: Secondary | ICD-10-CM

## 2020-01-25 DIAGNOSIS — G8929 Other chronic pain: Secondary | ICD-10-CM

## 2020-01-25 DIAGNOSIS — Z6841 Body Mass Index (BMI) 40.0 and over, adult: Secondary | ICD-10-CM

## 2020-01-25 DIAGNOSIS — E1159 Type 2 diabetes mellitus with other circulatory complications: Secondary | ICD-10-CM

## 2020-01-25 DIAGNOSIS — I152 Hypertension secondary to endocrine disorders: Secondary | ICD-10-CM | POA: Diagnosis not present

## 2020-01-25 DIAGNOSIS — M8949 Other hypertrophic osteoarthropathy, multiple sites: Secondary | ICD-10-CM

## 2020-01-25 DIAGNOSIS — Z131 Encounter for screening for diabetes mellitus: Secondary | ICD-10-CM

## 2020-01-25 DIAGNOSIS — Z8709 Personal history of other diseases of the respiratory system: Secondary | ICD-10-CM

## 2020-01-25 DIAGNOSIS — E785 Hyperlipidemia, unspecified: Secondary | ICD-10-CM

## 2020-01-25 DIAGNOSIS — M159 Polyosteoarthritis, unspecified: Secondary | ICD-10-CM

## 2020-01-25 LAB — COMPREHENSIVE METABOLIC PANEL
ALT: 47 IU/L — ABNORMAL HIGH (ref 0–32)
AST: 22 IU/L (ref 0–40)
Albumin/Globulin Ratio: 1.2 (ref 1.2–2.2)
Albumin: 4.1 g/dL (ref 3.8–4.8)
Alkaline Phosphatase: 128 IU/L — ABNORMAL HIGH (ref 44–121)
BUN/Creatinine Ratio: 25 (ref 12–28)
BUN: 21 mg/dL (ref 8–27)
Bilirubin Total: 0.3 mg/dL (ref 0.0–1.2)
CO2: 22 mmol/L (ref 20–29)
Calcium: 9.8 mg/dL (ref 8.7–10.3)
Chloride: 100 mmol/L (ref 96–106)
Creatinine, Ser: 0.85 mg/dL (ref 0.57–1.00)
GFR calc Af Amer: 84 mL/min/{1.73_m2} (ref >59)
GFR calc non Af Amer: 73 mL/min/{1.73_m2} (ref >59)
Globulin, Total: 3.3 g/dL (ref 1.5–4.5)
Glucose: 116 mg/dL — ABNORMAL HIGH (ref 65–99)
Potassium: 4.7 mmol/L (ref 3.5–5.2)
Sodium: 139 mmol/L (ref 134–144)
Total Protein: 7.4 g/dL (ref 6.0–8.5)

## 2020-01-25 LAB — POCT GLYCOSYLATED HEMOGLOBIN (HGB A1C): Hemoglobin A1C: 6.9 % — AB (ref 4.0–5.6)

## 2020-01-25 LAB — GLUCOSE, POCT (MANUAL RESULT ENTRY): POC Glucose: 133 mg/dl — AB (ref 70–99)

## 2020-01-25 MED ORDER — AMLODIPINE BESYLATE 5 MG PO TABS: 5.0000 mg | ORAL_TABLET | Freq: Every day | ORAL | 3 refills | Status: DC

## 2020-01-25 MED ORDER — METFORMIN HCL 1000 MG PO TABS: 1000.0000 mg | ORAL_TABLET | Freq: Two times a day (BID) | ORAL | 3 refills | Status: DC

## 2020-01-25 MED ORDER — LOSARTAN POTASSIUM-HCTZ 100-12.5 MG PO TABS: 1.0000 | ORAL_TABLET | Freq: Every day | ORAL | 3 refills | Status: DC

## 2020-01-25 MED ORDER — ATORVASTATIN CALCIUM 40 MG PO TABS: 40.0000 mg | ORAL_TABLET | Freq: Every day | ORAL | 3 refills | Status: DC

## 2020-01-25 MED ORDER — ALBUTEROL SULFATE HFA 108 (90 BASE) MCG/ACT IN AERS: 2.0000 | INHALATION_SPRAY | Freq: Four times a day (QID) | RESPIRATORY_TRACT | 3 refills | Status: DC | PRN

## 2020-01-25 NOTE — Patient Instructions (Addendum)
   If you have lab work done today you will be contacted with your lab results within the next 2 weeks.  If you have not heard from us then please contact us. The fastest way to get your results is to register for My Chart.   IF you received an x-ray today, you will receive an invoice from [facility]. Please contact  Radiology at [phone] with questions or concerns regarding your invoice.   IF you received labwork today, you will receive an invoice from [facility]. Please contact [facility] at [phone] with questions or concerns regarding your invoice.   Our billing staff will not be able to assist you with questions regarding bills from these companies.  You will be contacted with the lab results as soon as they are available. The fastest way to get your results is to activate your My Chart account. Instructions are located on the last page of this paperwork. If you have not heard from us regarding the results in 2 weeks, please contact this office.     Diabetes Mellitus and Nutrition, Adult When you have diabetes (diabetes mellitus), it is very important to have healthy eating habits because your blood sugar (glucose) levels are greatly affected by what you eat and drink. Eating healthy foods in the appropriate amounts, at about the same times every day, can help you:  Control your blood glucose.  Lower your risk of heart disease.  Improve your blood pressure.  Reach or maintain a healthy weight. Every person with diabetes is different, and each person has different needs for a meal plan. Your health care provider may recommend that you work with a diet and nutrition specialist (dietitian) to make a meal plan that is best for you. Your meal plan may vary depending on factors such as:  The calories you need.  The medicines you take.  Your weight.  Your blood glucose, blood pressure, and cholesterol levels.  Your activity level.  Other health conditions  you have, such as heart or kidney disease. How do carbohydrates affect me? Carbohydrates, also called carbs, affect your blood glucose level more than any other type of food. Eating carbs naturally raises the amount of glucose in your blood. Carb counting is a method for keeping track of how many carbs you eat. Counting carbs is important to keep your blood glucose at a healthy level, especially if you use insulin or take certain oral diabetes medicines. It is important to know how many carbs you can safely have in each meal. This is different for every person. Your dietitian can help you calculate how many carbs you should have at each meal and for each snack. Foods that contain carbs include:  Bread, cereal, rice, pasta, and crackers.  Potatoes and corn.  Peas, beans, and lentils.  Milk and yogurt.  Fruit and juice.  Desserts, such as cakes, cookies, ice cream, and candy. How does alcohol affect me? Alcohol can cause a sudden decrease in blood glucose (hypoglycemia), especially if you use insulin or take certain oral diabetes medicines. Hypoglycemia can be a life-threatening condition. Symptoms of hypoglycemia (sleepiness, dizziness, and confusion) are similar to symptoms of having too much alcohol. If your health care provider says that alcohol is safe for you, follow these guidelines:  Limit alcohol intake to no more than 1 drink per day for nonpregnant women and 2 drinks per day for men. One drink equals 12 oz of beer, 5 oz of wine, or 1 oz of hard liquor.    Do not drink on an empty stomach.  Keep yourself hydrated with water, diet soda, or unsweetened iced tea.  Keep in mind that regular soda, juice, and other mixers may contain a lot of sugar and must be counted as carbs. What are tips for following this plan?  Reading food labels  Start by checking the serving size on the "Nutrition Facts" label of packaged foods and drinks. The amount of calories, carbs, fats, and other  nutrients listed on the label is based on one serving of the item. Many items contain more than one serving per package.  Check the total grams (g) of carbs in one serving. You can calculate the number of servings of carbs in one serving by dividing the total carbs by 15. For example, if a food has 30 g of total carbs, it would be equal to 2 servings of carbs.  Check the number of grams (g) of saturated and trans fats in one serving. Choose foods that have low or no amount of these fats.  Check the number of milligrams (mg) of salt (sodium) in one serving. Most people should limit total sodium intake to less than 2,300 mg per day.  Always check the nutrition information of foods labeled as "low-fat" or "nonfat". These foods may be higher in added sugar or refined carbs and should be avoided.  Talk to your dietitian to identify your daily goals for nutrients listed on the label. Shopping  Avoid buying canned, premade, or processed foods. These foods tend to be high in fat, sodium, and added sugar.  Shop around the outside edge of the grocery store. This includes fresh fruits and vegetables, bulk grains, fresh meats, and fresh dairy. Cooking  Use low-heat cooking methods, such as baking, instead of high-heat cooking methods like deep frying.  Cook using healthy oils, such as olive, canola, or sunflower oil.  Avoid cooking with butter, cream, or high-fat meats. Meal planning  Eat meals and snacks regularly, preferably at the same times every day. Avoid going long periods of time without eating.  Eat foods high in fiber, such as fresh fruits, vegetables, beans, and whole grains. Talk to your dietitian about how many servings of carbs you can eat at each meal.  Eat 4-6 ounces (oz) of lean protein each day, such as lean meat, chicken, fish, eggs, or tofu. One oz of lean protein is equal to: ? 1 oz of meat, chicken, or fish. ? 1 egg. ?  cup of tofu.  Eat some foods each day that contain  healthy fats, such as avocado, nuts, seeds, and fish. Lifestyle  Check your blood glucose regularly.  Exercise regularly as told by your health care provider. This may include: ? 150 minutes of moderate-intensity or vigorous-intensity exercise each week. This could be brisk walking, biking, or water aerobics. ? Stretching and doing strength exercises, such as yoga or weightlifting, at least 2 times a week.  Take medicines as told by your health care provider.  Do not use any products that contain nicotine or tobacco, such as cigarettes and e-cigarettes. If you need help quitting, ask your health care provider.  Work with a counselor or diabetes educator to identify strategies to manage stress and any emotional and social challenges. Questions to ask a health care provider  Do I need to meet with a diabetes educator?  Do I need to meet with a dietitian?  What number can I call if I have questions?  When are the best times to   times to check my blood glucose? Where to find more information:  American Diabetes Association: diabetes.org  Academy of Nutrition and Dietetics: www.eatright.org  National Institute of Diabetes and Digestive and Kidney Diseases (NIH): www.niddk.nih.gov Summary  A healthy meal plan will help you control your blood glucose and maintain a healthy lifestyle.  Working with a diet and nutrition specialist (dietitian) can help you make a meal plan that is best for you.  Keep in mind that carbohydrates (carbs) and alcohol have immediate effects on your blood glucose levels. It is important to count carbs and to use alcohol carefully. This information is not intended to replace advice given to you by your health care provider. Make sure you discuss any questions you have with your health care provider. Document Revised: 01/02/2017 Document Reviewed: 02/25/2016 Elsevier Patient Education  2020 Elsevier Inc.  

## 2020-01-25 NOTE — Assessment & Plan Note (Signed)
Well-controlled hypertension.  Continue present medications.  No changes. Well-controlled diabetes with hemoglobin A1c of 6.9.  Continue Metformin. Continue atorvastatin 40 mg daily. Diet and nutrition discussed. Follow-up in 3 to 6 months.

## 2020-01-25 NOTE — Progress Notes (Signed)
Catherine Cline 64 y.o.   Chief Complaint  Patient presents with  . Hypertension    Follow up 6 month  . Medication Refill    Albuterol Inhaler, Amlodipine, Atorvastatin, Losartan-Hctz and Metformin    HISTORY OF PRESENT ILLNESS: This is a 64 y.o. female with history of diabetes and hypertension and dyslipidemia here for follow-up and medication refill. The 10-year ASCVD risk score Mikey Bussing DC Brooke Bonito., et al., 2013) is: 13.4%   Values used to calculate the score:     Age: 34 years     Sex: Female     Is Non-Hispanic African American: No     Diabetic: Yes     Tobacco smoker: No     Systolic Blood Pressure: 194 mmHg     Is BP treated: Yes     HDL Cholesterol: 46 mg/dL     Total Cholesterol: 149 mg/dL  BP Readings from Last 3 Encounters:  01/25/20 (!) 146/83  10/12/19 136/87  07/13/19 (!) 160/86   Lab Results  Component Value Date   HGBA1C 6.7 (A) 10/12/2019  Has no complaints or medical concerns today. Fully vaccinated against Covid with a booster.   HPI   Prior to Admission medications   Medication Sig Start Date End Date Taking? Authorizing Provider  albuterol (VENTOLIN HFA) 108 (90 Base) MCG/ACT inhaler Inhale 2 puffs into the lungs every 6 (six) hours as needed for wheezing or shortness of breath. 01/12/19  Yes Averi Kilty, Ines Bloomer, MD  amLODipine (NORVASC) 5 MG tablet 5 mg.   Yes [provider]  aspirin EC 81 MG tablet Take 81 mg by mouth in the morning and at bedtime.    Yes [provider]  atorvastatin (LIPITOR) 40 MG tablet Take 1 tablet (40 mg total) by mouth daily. 01/12/19  Yes Orlander Norwood, Ines Bloomer, MD  budesonide-formoterol [facility]) 80-4.5 MCG/ACT inhaler Inhale 2 puffs into the lungs 2 (two) times daily. 07/13/19  Yes Meliton Samad, Ines Bloomer, MD  Cholecalciferol (VITAMIN D3) 5000 units CAPS Take by mouth daily.   Yes [provider]  Levocetirizine Dihydrochloride (XYZAL ALLERGY 24HR PO) Take by mouth.   Yes [provider]   losartan-hydrochlorothiazide (HYZAAR) 100-12.5 MG tablet Take 1 tablet by mouth daily. 07/13/19  Yes Horald Pollen, MD  metFORMIN (GLUCOPHAGE) 1000 MG tablet Take 1 tablet (1,000 mg total) by mouth 2 (two) times daily with a meal. 01/12/19  Yes Eufelia Veno, Ines Bloomer, MD  Multiple Vitamin (MULTIVITAMIN) tablet Take 1 tablet by mouth daily.   Yes [provider]    No Known Allergies  Patient Active Problem List   Diagnosis Date Noted  . Body mass index (BMI) of 40.1-44.9 in adult ([place]) 10/12/2019  . Moderate persistent asthma with acute exacerbation 07/13/2019  . Primary osteoarthritis involving multiple joints 07/14/2018  . Chronic joint pain 07/14/2018  . Dyslipidemia associated with type 2 diabetes mellitus ([place]) 01/14/2018  . Hypertension associated with diabetes ([place]) 07/07/2017  . Hyperlipidemia 07/07/2017    Past Medical History:  Diagnosis Date  . Arthritis   . Asthma   . Hypertension     Past Surgical History:  Procedure Laterality Date  . OVARIAN CYST REMOVAL      Social History   Socioeconomic History  . Marital status: Single    Spouse name: Not on file  . Number of children: Not on file  . Years of education: Not on file  . Highest education level: Not on file  Occupational History  . Not on  file  Tobacco Use  . Smoking status: Never Smoker  . Smokeless tobacco: Never Used  Substance and Sexual Activity  . Alcohol use: Not Currently    Comment: socially  . Drug use: Never  . Sexual activity: Not on file  Other Topics Concern  . Not on file  Social History Narrative  . Not on file   Social Determinants of Health   Financial Resource Strain: Not on file  Food Insecurity: Not on file  Transportation Needs: Not on file  Physical Activity: Not on file  Stress: Not on file  Social Connections: Not on file  Intimate Partner Violence: Not on file    Family History  Problem Relation Age of Onset  . Heart disease Mother   .  Hypertension Mother   . Stroke Mother   . Diabetes Sister   . Hypertension Sister      Review of Systems  Constitutional: Negative.  Negative for chills and fever.  HENT: Negative.  Negative for congestion and sore throat.   Respiratory: Negative.  Negative for cough and shortness of breath.   Cardiovascular: Negative.  Negative for chest pain and palpitations.  Gastrointestinal: Negative.  Negative for abdominal pain, diarrhea, nausea and vomiting.  Genitourinary: Negative.  Negative for dysuria, frequency and hematuria.  Musculoskeletal: Positive for joint pain.  Skin: Negative.   Neurological: Negative.  Negative for dizziness and headaches.  Endo/Heme/Allergies: Negative.   All other systems reviewed and are negative.   Today's Vitals   01/25/20 1037  BP: (!) 146/83  Pulse: 91  Resp: 16  Temp: 97.8 F (36.6 C)  TempSrc: Temporal  SpO2: 95%  Weight: 221 lb (100.2 kg)  Height: 5\' 2"  (1.575 m)   Body mass index is 40.42 kg/m. Wt Readings from Last 3 Encounters:  01/25/20 221 lb (100.2 kg)  10/12/19 220 lb (99.8 kg)  07/13/19 211 lb (95.7 kg)    Physical Exam Vitals reviewed.  Constitutional:      Appearance: Normal appearance. She is obese.  HENT:     Head: Normocephalic.  Eyes:     Extraocular Movements: Extraocular movements intact.     Conjunctiva/sclera: Conjunctivae normal.     Pupils: Pupils are equal, round, and reactive to light.  Cardiovascular:     Rate and Rhythm: Normal rate and regular rhythm.     Pulses: Normal pulses.     Heart sounds: Normal heart sounds.  Pulmonary:     Effort: Pulmonary effort is normal.     Breath sounds: Normal breath sounds.  Musculoskeletal:        General: Normal range of motion.     Cervical back: Normal range of motion and neck supple.  Skin:    General: Skin is warm and dry.     Capillary Refill: Capillary refill takes less than 2 seconds.  Neurological:     General: No focal deficit present.     Mental  Status: She is alert and oriented to person, place, and time.  Psychiatric:        Mood and Affect: Mood normal.        Behavior: Behavior normal.    Results for orders placed or performed in visit on 01/25/20 (from the past 24 hour(s))  POCT glucose (manual entry)     Status: Abnormal   Collection Time: 01/25/20 10:53 AM  Result Value Ref Range   POC Glucose 133 (A) 70 - 99 mg/dl  POCT glycosylated hemoglobin (Hb A1C)     Status:  Abnormal   Collection Time: 01/25/20 11:13 AM  Result Value Ref Range   Hemoglobin A1C 6.9 (A) 4.0 - 5.6 %   HbA1c POC (<> result, manual entry)     HbA1c, POC (prediabetic range)     HbA1c, POC (controlled diabetic range)       ASSESSMENT & PLAN: Hypertension associated with diabetes (HCC) Well-controlled hypertension.  Continue present medications.  No changes. Well-controlled diabetes with hemoglobin A1c of 6.9.  Continue Metformin. Continue atorvastatin 40 mg daily. Diet and nutrition discussed. Follow-up in 3 to 6 months.  Zalia was seen today for hypertension and medication refill.  Diagnoses and all orders for this visit:  Hypertension associated with diabetes (HCC) -     Ambulatory referral to Ophthalmology -     POCT glucose (manual entry) -     POCT glycosylated hemoglobin (Hb A1C) -     Comprehensive metabolic panel -     amLODipine (NORVASC) 5 MG tablet; Take 1 tablet (5 mg total) by mouth daily. -     losartan-hydrochlorothiazide (HYZAAR) 100-12.5 MG tablet; Take 1 tablet by mouth daily. -     metFORMIN (GLUCOPHAGE) 1000 MG tablet; Take 1 tablet (1,000 mg total) by mouth 2 (two) times daily with a meal.  Dyslipidemia associated with type 2 diabetes mellitus (HCC)  Chronic joint pain  Primary osteoarthritis involving multiple joints  Screening for diabetes mellitus -     Ambulatory referral to Ophthalmology  Body mass index (BMI) of 40.1-44.9 in adult [facility])  Hyperlipidemia, unspecified hyperlipidemia type -     atorvastatin  (LIPITOR) 40 MG tablet; Take 1 tablet (40 mg total) by mouth daily.  History of asthma -     albuterol (VENTOLIN HFA) 108 (90 Base) MCG/ACT inhaler; Inhale 2 puffs into the lungs every 6 (six) hours as needed for wheezing or shortness of breath.    Patient Instructions       If you have lab work done today you will be contacted with your lab results within the next 2 weeks.  If you have not heard from [place] then please contact us. The fastest way to get your results is to register for My Chart.   IF you received an x-ray today, you will receive an invoice from [facility]. Please contact [facility] Radiology at [phone] with questions or concerns regarding your invoice.   IF you received labwork today, you will receive an invoice from [place]. Please contact [facility] at [phone] with questions or concerns regarding your invoice.   Our billing staff will not be able to assist you with questions regarding bills from these companies.  You will be contacted with the lab results as soon as they are available. The fastest way to get your results is to activate your My Chart account. Instructions are located on the last page of this paperwork. If you have not heard from [place] regarding the results in 2 weeks, please contact this office.     Diabetes Mellitus and Nutrition, Adult When you have diabetes (diabetes mellitus), it is very important to have healthy eating habits because your blood sugar (glucose) levels are greatly affected by what you eat and drink. Eating healthy foods in the appropriate amounts, at about the same times every day, can help you:  Control your blood glucose.  Lower your risk of heart disease.  Improve your blood pressure.  Reach or maintain a healthy weight. Every person with diabetes is different, and each person has different needs for a meal  plan. Your health care provider may recommend that you work with a diet and nutrition specialist  (dietitian) to make a meal plan that is best for you. Your meal plan may vary depending on factors such as:  The calories you need.  The medicines you take.  Your weight.  Your blood glucose, blood pressure, and cholesterol levels.  Your activity level.  Other health conditions you have, such as heart or kidney disease. How do carbohydrates affect me? Carbohydrates, also called carbs, affect your blood glucose level more than any other type of food. Eating carbs naturally raises the amount of glucose in your blood. Carb counting is a method for keeping track of how many carbs you eat. Counting carbs is important to keep your blood glucose at a healthy level, especially if you use insulin or take certain oral diabetes medicines. It is important to know how many carbs you can safely have in each meal. This is different for every person. Your dietitian can help you calculate how many carbs you should have at each meal and for each snack. Foods that contain carbs include:  Bread, cereal, rice, pasta, and crackers.  Potatoes and corn.  Peas, beans, and lentils.  Milk and yogurt.  Fruit and juice.  Desserts, such as cakes, cookies, ice cream, and candy. How does alcohol affect me? Alcohol can cause a sudden decrease in blood glucose (hypoglycemia), especially if you use insulin or take certain oral diabetes medicines. Hypoglycemia can be a life-threatening condition. Symptoms of hypoglycemia (sleepiness, dizziness, and confusion) are similar to symptoms of having too much alcohol. If your health care provider says that alcohol is safe for you, follow these guidelines:  Limit alcohol intake to no more than 1 drink per day for nonpregnant women and 2 drinks per day for men. One drink equals 12 oz of beer, 5 oz of wine, or 1 oz of hard liquor.  Do not drink on an empty stomach.  Keep yourself hydrated with water, diet soda, or unsweetened iced tea.  Keep in mind that regular soda,  juice, and other mixers may contain a lot of sugar and must be counted as carbs. What are tips for following this plan?  Reading food labels  Start by checking the serving size on the "Nutrition Facts" label of packaged foods and drinks. The amount of calories, carbs, fats, and other nutrients listed on the label is based on one serving of the item. Many items contain more than one serving per package.  Check the total grams (g) of carbs in one serving. You can calculate the number of servings of carbs in one serving by dividing the total carbs by 15. For example, if a food has 30 g of total carbs, it would be equal to 2 servings of carbs.  Check the number of grams (g) of saturated and trans fats in one serving. Choose foods that have low or no amount of these fats.  Check the number of milligrams (mg) of salt (sodium) in one serving. Most people should limit total sodium intake to less than 2,300 mg per day.  Always check the nutrition information of foods labeled as "low-fat" or "nonfat". These foods may be higher in added sugar or refined carbs and should be avoided.  Talk to your dietitian to identify your daily goals for nutrients listed on the label. Shopping  Avoid buying canned, premade, or processed foods. These foods tend to be high in fat, sodium, and added sugar.  Shop around  the outside edge of the grocery store. This includes fresh fruits and vegetables, bulk grains, fresh meats, and fresh dairy. Cooking  Use low-heat cooking methods, such as baking, instead of high-heat cooking methods like deep frying.  Cook using healthy oils, such as olive, canola, or sunflower oil.  Avoid cooking with butter, cream, or high-fat meats. Meal planning  Eat meals and snacks regularly, preferably at the same times every day. Avoid going long periods of time without eating.  Eat foods high in fiber, such as fresh fruits, vegetables, beans, and whole grains. Talk to your dietitian about  how many servings of carbs you can eat at each meal.  Eat 4-6 ounces (oz) of lean protein each day, such as lean meat, chicken, fish, eggs, or tofu. One oz of lean protein is equal to: ? 1 oz of meat, chicken, or fish. ? 1 egg. ?  cup of tofu.  Eat some foods each day that contain healthy fats, such as avocado, nuts, seeds, and fish. Lifestyle  Check your blood glucose regularly.  Exercise regularly as told by your health care provider. This may include: ? 150 minutes of moderate-intensity or vigorous-intensity exercise each week. This could be brisk walking, biking, or water aerobics. ? Stretching and doing strength exercises, such as yoga or weightlifting, at least 2 times a week.  Take medicines as told by your health care provider.  Do not use any products that contain nicotine or tobacco, such as cigarettes and e-cigarettes. If you need help quitting, ask your health care provider.  Work with a Social worker or diabetes educator to identify strategies to manage stress and any emotional and social challenges. Questions to ask a health care provider  Do I need to meet with a diabetes educator?  Do I need to meet with a dietitian?  What number can I call if I have questions?  When are the best times to check my blood glucose? Where to find more information:  American Diabetes Association: diabetes.org  Academy of Nutrition and Dietetics: www.eatright.[employer] of Diabetes and Digestive and Kidney Diseases (NIH): [employer] Summary  A healthy meal plan will help you control your blood glucose and maintain a healthy lifestyle.  Working with a diet and nutrition specialist (dietitian) can help you make a meal plan that is best for you.  Keep in mind that carbohydrates (carbs) and alcohol have immediate effects on your blood glucose levels. It is important to count carbs and to use alcohol carefully. This information is not intended to replace advice given  to you by your health care provider. Make sure you discuss any questions you have with your health care provider. Document Revised: 01/02/2017 Document Reviewed: 02/25/2016 Elsevier Patient Education  2020 Elsevier Inc.      Agustina Caroli, MD [facility]

## 2020-01-26 ENCOUNTER — Telehealth: Payer: Self-pay | Admitting: *Deleted

## 2020-01-26 NOTE — Telephone Encounter (Signed)
I called [facility] to asked about the order (URGENT) signed by Dr Mitchel Honour on 12/28/2019. The person I spoke(did not get name) told me the patient has a appointment on 01/31/2020, she was not sure why he got the request.

## 2020-01-31 ENCOUNTER — Ambulatory Visit
Admission: RE | Admit: 2020-01-31 | Discharge: 2020-01-31 | Disposition: A | Discharge status: Home / Self Care | Payer: 59 | Source: Ambulatory Visit | Attending: Emergency Medicine | Admitting: Emergency Medicine

## 2020-01-31 ENCOUNTER — Other Ambulatory Visit: Payer: Self-pay | Admitting: Emergency Medicine

## 2020-01-31 ENCOUNTER — Other Ambulatory Visit: Payer: Self-pay

## 2020-01-31 DIAGNOSIS — R928 Other abnormal and inconclusive findings on diagnostic imaging of breast: Secondary | ICD-10-CM

## 2020-03-12 ENCOUNTER — Telehealth: Payer: Self-pay | Admitting: Emergency Medicine

## 2020-03-12 ENCOUNTER — Other Ambulatory Visit: Payer: Self-pay

## 2020-03-12 DIAGNOSIS — J4541 Moderate persistent asthma with (acute) exacerbation: Secondary | ICD-10-CM

## 2020-03-12 MED ORDER — BUDESONIDE-FORMOTEROL FUMARATE 80-4.5 MCG/ACT IN AERO: 2.0000 | INHALATION_SPRAY | Freq: Two times a day (BID) | RESPIRATORY_TRACT | 3 refills | Status: DC

## 2020-03-12 NOTE — Telephone Encounter (Signed)
Patientis calling to let us let us since her recent insurance change she is wanting to change her generic  budesonide-formoterol (SYMBICORT) 80-4.5 MCG/ACT inhaler  To the [facility] name SYMBICORT  still using  [address] [place], [place] [address]    Patient needs new script / the generic is 150.00 a month and the Brand name is less expensive . patient is completely out as of last night

## 2020-04-12 ENCOUNTER — Ambulatory Visit: Payer: Self-pay | Admitting: Emergency Medicine

## 2020-04-16 LAB — HM DIABETES EYE EXAM

## 2020-04-18 ENCOUNTER — Encounter: Payer: Self-pay | Admitting: *Deleted

## 2020-04-24 ENCOUNTER — Ambulatory Visit (INDEPENDENT_AMBULATORY_CARE_PROVIDER_SITE_OTHER): Payer: 59 | Admitting: Emergency Medicine

## 2020-04-24 ENCOUNTER — Encounter: Payer: Self-pay | Admitting: Emergency Medicine

## 2020-04-24 ENCOUNTER — Other Ambulatory Visit: Payer: Self-pay

## 2020-04-24 VITALS — BP 133/82 | HR 88 | Temp 97.9°F | Resp 16 | Ht 62.0 in | Wt 226.0 lb

## 2020-04-24 DIAGNOSIS — E1169 Type 2 diabetes mellitus with other specified complication: Secondary | ICD-10-CM | POA: Diagnosis not present

## 2020-04-24 DIAGNOSIS — M2550 Pain in unspecified joint: Secondary | ICD-10-CM | POA: Diagnosis not present

## 2020-04-24 DIAGNOSIS — M8949 Other hypertrophic osteoarthropathy, multiple sites: Secondary | ICD-10-CM | POA: Diagnosis not present

## 2020-04-24 DIAGNOSIS — Z8709 Personal history of other diseases of the respiratory system: Secondary | ICD-10-CM

## 2020-04-24 DIAGNOSIS — E1159 Type 2 diabetes mellitus with other circulatory complications: Secondary | ICD-10-CM

## 2020-04-24 DIAGNOSIS — E1165 Type 2 diabetes mellitus with hyperglycemia: Secondary | ICD-10-CM

## 2020-04-24 DIAGNOSIS — E785 Hyperlipidemia, unspecified: Secondary | ICD-10-CM

## 2020-04-24 DIAGNOSIS — I152 Hypertension secondary to endocrine disorders: Secondary | ICD-10-CM | POA: Diagnosis not present

## 2020-04-24 DIAGNOSIS — Z6841 Body Mass Index (BMI) 40.0 and over, adult: Secondary | ICD-10-CM

## 2020-04-24 DIAGNOSIS — M159 Polyosteoarthritis, unspecified: Secondary | ICD-10-CM

## 2020-04-24 DIAGNOSIS — G8929 Other chronic pain: Secondary | ICD-10-CM

## 2020-04-24 LAB — POCT GLYCOSYLATED HEMOGLOBIN (HGB A1C): Hemoglobin A1C: 7.1 % — AB (ref 4.0–5.6)

## 2020-04-24 LAB — GLUCOSE, POCT (MANUAL RESULT ENTRY): POC Glucose: 124 mg/dl — AB (ref 70–99)

## 2020-04-24 MED ORDER — TRULICITY 0.75 MG/0.5ML ~~LOC~~ SOAJ: 0.7500 mg | SUBCUTANEOUS | 5 refills | Status: DC

## 2020-04-24 NOTE — Patient Instructions (Addendum)
   If you have lab work done today you will be contacted with your lab results within the next 2 weeks.  If you have not heard from us then please contact us. The fastest way to get your results is to register for My Chart.   IF you received an x-ray today, you will receive an invoice from [facility]. Please contact [facility] at [phone] with questions or concerns regarding your invoice.   IF you received labwork today, you will receive an invoice from [facility]. Please contact [facility] at [phone] with questions or concerns regarding your invoice.   Our billing staff will not be able to assist you with questions regarding bills from these companies.  You will be contacted with the lab results as soon as they are available. The fastest way to get your results is to activate your My Chart account. Instructions are located on the last page of this paperwork. If you have not heard from us regarding the results in 2 weeks, please contact this office.     Diabetes Mellitus and Nutrition, Adult When you have diabetes, or diabetes mellitus, it is very important to have healthy eating habits because your blood sugar (glucose) levels are greatly affected by what you eat and drink. Eating healthy foods in the right amounts, at about the same times every day, can help you:  Control your blood glucose.  Lower your risk of heart disease.  Improve your blood pressure.  Reach or maintain a healthy weight. What can affect my meal plan? Every person with diabetes is different, and each person has different needs for a meal plan. Your health care provider may recommend that you work with a dietitian to make a meal plan that is best for you. Your meal plan may vary depending on factors such as:  The calories you need.  The medicines you take.  Your weight.  Your blood glucose, blood pressure, and cholesterol levels.  Your activity level.  Other health conditions you  have, such as heart or kidney disease. How do carbohydrates affect me? Carbohydrates, also called carbs, affect your blood glucose level more than any other type of food. Eating carbs naturally raises the amount of glucose in your blood. Carb counting is a method for keeping track of how many carbs you eat. Counting carbs is important to keep your blood glucose at a healthy level, especially if you use insulin or take certain oral diabetes medicines. It is important to know how many carbs you can safely have in each meal. This is different for every person. Your dietitian can help you calculate how many carbs you should have at each meal and for each snack. How does alcohol affect me? Alcohol can cause a sudden decrease in blood glucose (hypoglycemia), especially if you use insulin or take certain oral diabetes medicines. Hypoglycemia can be a life-threatening condition. Symptoms of hypoglycemia, such as sleepiness, dizziness, and confusion, are similar to symptoms of having too much alcohol.  Do not drink alcohol if: ? Your health care provider tells you not to drink. ? You are pregnant, may be pregnant, or are planning to become pregnant.  If you drink alcohol: ? Do not drink on an empty stomach. ? Limit how much you use to:  0-1 drink a day for women.  0-2 drinks a day for men. ? Be aware of how much alcohol is in your drink. In the [place] one drink equals one 12 oz bottle of beer (355 mL),   one 5 oz glass of wine (148 mL), or one 1 oz glass of hard liquor (44 mL). ? Keep yourself hydrated with water, diet soda, or unsweetened iced tea.  Keep in mind that regular soda, juice, and other mixers may contain a lot of sugar and must be counted as carbs. What are tips for following this plan? Reading food labels  Start by checking the serving size on the "Nutrition Facts" label of packaged foods and drinks. The amount of calories, carbs, fats, and other nutrients listed on the label is based on  one serving of the item. Many items contain more than one serving per package.  Check the total grams (g) of carbs in one serving. You can calculate the number of servings of carbs in one serving by dividing the total carbs by 15. For example, if a food has 30 g of total carbs per serving, it would be equal to 2 servings of carbs.  Check the number of grams (g) of saturated fats and trans fats in one serving. Choose foods that have a low amount or none of these fats.  Check the number of milligrams (mg) of salt (sodium) in one serving. Most people should limit total sodium intake to less than 2,300 mg per day.  Always check the nutrition information of foods labeled as "low-fat" or "nonfat." These foods may be higher in added sugar or refined carbs and should be avoided.  Talk to your dietitian to identify your daily goals for nutrients listed on the label. Shopping  Avoid buying canned, pre-made, or processed foods. These foods tend to be high in fat, sodium, and added sugar.  Shop around the outside edge of the grocery store. This is where you will most often find fresh fruits and vegetables, bulk grains, fresh meats, and fresh dairy. Cooking  Use low-heat cooking methods, such as baking, instead of high-heat cooking methods like deep frying.  Cook using healthy oils, such as olive, canola, or sunflower oil.  Avoid cooking with butter, cream, or high-fat meats. Meal planning  Eat meals and snacks regularly, preferably at the same times every day. Avoid going long periods of time without eating.  Eat foods that are high in fiber, such as fresh fruits, vegetables, beans, and whole grains. Talk with your dietitian about how many servings of carbs you can eat at each meal.  Eat 4-6 oz (112-168 g) of lean protein each day, such as lean meat, chicken, fish, eggs, or tofu. One ounce (oz) of lean protein is equal to: ? 1 oz (28 g) of meat, chicken, or fish. ? 1 egg. ?  cup (62 g) of  tofu.  Eat some foods each day that contain healthy fats, such as avocado, nuts, seeds, and fish.   What foods should I eat? Fruits Berries. Apples. Oranges. Peaches. Apricots. Plums. Grapes. Mango. Papaya. Pomegranate. Kiwi. Cherries. Vegetables Lettuce. Spinach. Leafy greens, including kale, chard, collard greens, and mustard greens. Beets. Cauliflower. Cabbage. Broccoli. Carrots. Green beans. Tomatoes. Peppers. Onions. Cucumbers. Brussels sprouts. Grains Whole grains, such as whole-wheat or whole-grain bread, crackers, tortillas, cereal, and pasta. Unsweetened oatmeal. Quinoa. Brown or wild rice. Meats and other proteins Seafood. Poultry without skin. Lean cuts of poultry and beef. Tofu. Nuts. Seeds. Dairy Low-fat or fat-free dairy products such as milk, yogurt, and cheese. The items listed above may not be a complete list of foods and beverages you can eat. Contact a dietitian for more information. What foods should I avoid? Fruits Fruits canned   with syrup. Vegetables Canned vegetables. Frozen vegetables with butter or cream sauce. Grains Refined white flour and flour products such as bread, pasta, snack foods, and cereals. Avoid all processed foods. Meats and other proteins Fatty cuts of meat. Poultry with skin. Breaded or fried meats. Processed meat. Avoid saturated fats. Dairy Full-fat yogurt, cheese, or milk. Beverages Sweetened drinks, such as soda or iced tea. The items listed above may not be a complete list of foods and beverages you should avoid. Contact a dietitian for more information. Questions to ask a health care provider  Do I need to meet with a diabetes educator?  Do I need to meet with a dietitian?  What number can I call if I have questions?  When are the best times to check my blood glucose? Where to find more information:  American Diabetes Association: diabetes.org  Academy of Nutrition and Dietetics: www.eatright.org  National Institute of  Diabetes and Digestive and Kidney Diseases: www.niddk.nih.gov  Association of Diabetes Care and Education Specialists: www.diabeteseducator.org Summary  It is important to have healthy eating habits because your blood sugar (glucose) levels are greatly affected by what you eat and drink.  A healthy meal plan will help you control your blood glucose and maintain a healthy lifestyle.  Your health care provider may recommend that you work with a dietitian to make a meal plan that is best for you.  Keep in mind that carbohydrates (carbs) and alcohol have immediate effects on your blood glucose levels. It is important to count carbs and to use alcohol carefully. This information is not intended to replace advice given to you by your health care provider. Make sure you discuss any questions you have with your health care provider. Document Revised: 12/28/2018 Document Reviewed: 12/28/2018 [employer] Patient Education  2021 [employer]  

## 2020-04-24 NOTE — Progress Notes (Signed)
Catherine Cline 65 y.o.   Chief Complaint  Patient presents with  . Diabetes  . Hypertension    Follow up 6 month    HISTORY OF PRESENT ILLNESS: This is a 65 y.o. female with history of hypertension diabetes and dyslipidemia here for 31-month follow-up. #1 hypertension: On amlodipine 5 mg and losartan-HCTZ 100-12.5 mg daily. #2 diabetes: On Metformin 1000 mg twice a day #3 dyslipidemia: On atorvastatin 40 mg daily Also takes 1 baby aspirin daily #4 history of asthma: Well-controlled. Up-to-date with mammogram.  Cologuard test negative last year. No other complaints or medical concerns today. Fully vaccinated against COVID.  HPI   Prior to Admission medications   Medication Sig Start Date End Date Taking? Authorizing Provider  albuterol (VENTOLIN HFA) 108 (90 Base) MCG/ACT inhaler Inhale 2 puffs into the lungs every 6 (six) hours as needed for wheezing or shortness of breath. 01/25/20  Yes Amedeo Detweiler, Ines Bloomer, MD  amLODipine (NORVASC) 5 MG tablet Take 1 tablet (5 mg total) by mouth daily. 01/25/20 04/24/20 Yes SagardiaInes Bloomer, MD  aspirin EC 81 MG tablet Take 81 mg by mouth in the morning and at bedtime.    Yes [provider]  atorvastatin (LIPITOR) 40 MG tablet Take 1 tablet (40 mg total) by mouth daily. 01/25/20  Yes Brieanne Mignone, Ines Bloomer, MD  budesonide-formoterol [facility]) 80-4.5 MCG/ACT inhaler Inhale 2 puffs into the lungs 2 (two) times daily. 03/12/20  Yes Raela Bohl, Ines Bloomer, MD  Cholecalciferol (VITAMIN D3) 5000 units CAPS Take by mouth daily.   Yes [provider]  KRILL OIL PO Take by mouth daily.   Yes [provider]  Levocetirizine Dihydrochloride (XYZAL ALLERGY 24HR PO) Take by mouth.   Yes [provider]  losartan-hydrochlorothiazide (HYZAAR) 100-12.5 MG tablet Take 1 tablet by mouth daily. 01/25/20  Yes Horald Pollen, MD  metFORMIN (GLUCOPHAGE) 1000 MG tablet Take 1 tablet (1,000 mg total) by mouth 2 (two) times  daily with a meal. 01/25/20  Yes Rhegan Trunnell, Ines Bloomer, MD  Multiple Vitamin (MULTIVITAMIN) tablet Take 1 tablet by mouth daily.   Yes [provider]    No Known Allergies  Patient Active Problem List   Diagnosis Date Noted  . Body mass index (BMI) of 40.1-44.9 in adult ([place]) 10/12/2019  . Moderate persistent asthma with acute exacerbation 07/13/2019  . Primary osteoarthritis involving multiple joints 07/14/2018  . Chronic joint pain 07/14/2018  . Dyslipidemia associated with type 2 diabetes mellitus ([place]) 01/14/2018  . Hypertension associated with diabetes ([place]) 07/07/2017  . Hyperlipidemia 07/07/2017    Past Medical History:  Diagnosis Date  . Arthritis   . Asthma   . Hypertension     Past Surgical History:  Procedure Laterality Date  . OVARIAN CYST REMOVAL      Social History   Socioeconomic History  . Marital status: Single    Spouse name: Not on file  . Number of children: Not on file  . Years of education: Not on file  . Highest education level: Not on file  Occupational History  . Not on file  Tobacco Use  . Smoking status: Never Smoker  . Smokeless tobacco: Never Used  Substance and Sexual Activity  . Alcohol use: Not Currently    Comment: socially  . Drug use: Never  . Sexual activity: Not on file  Other Topics Concern  . Not on file  Social History Narrative  . Not on file   Social Determinants of Health   Financial  Resource Strain: Not on file  Food Insecurity: Not on file  Transportation Needs: Not on file  Physical Activity: Not on file  Stress: Not on file  Social Connections: Not on file  Intimate Partner Violence: Not on file    Family History  Problem Relation Age of Onset  . Heart disease Mother   . Hypertension Mother   . Stroke Mother   . Diabetes Sister   . Hypertension Sister      Review of Systems  Constitutional: Negative.  Negative for chills and fever.  HENT: Negative.  Negative for congestion and sore  throat.   Respiratory: Negative.  Negative for cough and shortness of breath.   Cardiovascular: Negative.  Negative for chest pain and palpitations.  Gastrointestinal: Negative.  Negative for abdominal pain, blood in stool, diarrhea, melena, nausea and vomiting.  Genitourinary: Negative.  Negative for dysuria and hematuria.  Musculoskeletal: Negative.  Negative for back pain, myalgias and neck pain.  Skin: Negative.  Negative for rash.  Neurological: Negative.  Negative for dizziness and headaches.  All other systems reviewed and are negative.   Today's Vitals   04/24/20 0930  BP: 133/82  Pulse: 88  Resp: 16  Temp: 97.9 F (36.6 C)  TempSrc: Temporal  SpO2: 95%  Weight: 226 lb (102.5 kg)  Height: 5\' 2"  (1.575 m)   Body mass index is 41.34 kg/m. Wt Readings from Last 3 Encounters:  04/24/20 226 lb (102.5 kg)  01/25/20 221 lb (100.2 kg)  10/12/19 220 lb (99.8 kg)    Physical Exam Vitals reviewed.  Constitutional:      Appearance: Normal appearance.  HENT:     Head: Normocephalic.  Eyes:     Extraocular Movements: Extraocular movements intact.     Conjunctiva/sclera: Conjunctivae normal.     Pupils: Pupils are equal, round, and reactive to light.  Cardiovascular:     Rate and Rhythm: Normal rate and regular rhythm.     Pulses: Normal pulses.     Heart sounds: Normal heart sounds.  Pulmonary:     Effort: Pulmonary effort is normal.     Breath sounds: Normal breath sounds.  Musculoskeletal:        General: Normal range of motion.     Cervical back: Normal range of motion and neck supple.  Skin:    General: Skin is warm and dry.     Capillary Refill: Capillary refill takes less than 2 seconds.  Neurological:     General: No focal deficit present.     Mental Status: She is alert and oriented to person, place, and time.  Psychiatric:        Mood and Affect: Mood normal.    Results for orders placed or performed in visit on 04/24/20 (from the past 24 hour(s))   POCT glucose (manual entry)     Status: Abnormal   Collection Time: 04/24/20 10:27 AM  Result Value Ref Range   POC Glucose 124 (A) 70 - 99 mg/dl  POCT glycosylated hemoglobin (Hb A1C)     Status: Abnormal   Collection Time: 04/24/20 10:27 AM  Result Value Ref Range   Hemoglobin A1C 7.1 (A) 4.0 - 5.6 %   HbA1c POC (<> result, manual entry)     HbA1c, POC (prediabetic range)     HbA1c, POC (controlled diabetic range)       ASSESSMENT & PLAN: Hypertension associated with diabetes ([place]) Uncontrolled hypertension.  Continue present medication.  No changes. Uncontrolled diabetes with elevated A1c at  7.1, higher than before.  Has also gained some weight.  Continue Metformin 1000 mg twice a day Start weekly Trulicity 5.42 mg. Diet and nutrition discussed. Continue atorvastatin and daily baby aspirin. Follow-up in 3 months.  Catherine Cline was seen today for diabetes and hypertension.  Diagnoses and all orders for this visit:  Hypertension associated with diabetes ([place]) -     POCT glucose (manual entry) -     POCT glycosylated hemoglobin (Hb A1C)  Dyslipidemia associated with type 2 diabetes mellitus (H[facility])  Chronic joint pain  Primary osteoarthritis involving multiple joints  History of asthma  Body mass index (BMI) of 40.1-44.9 in adult [facility])  Uncontrolled type 2 diabetes mellitus with hyperglycemia (H[facility]) -     Dulaglutide (TRULICITY) 7.06 CB/7.6EG SOPN; Inject 0.75 mg into the skin once a week.    Patient Instructions       If you have lab work done today you will be contacted with your lab results within the next 2 weeks.  If you have not heard from [place] then please contact us. The fastest way to get your results is to register for My Chart.   IF you received an x-ray today, you will receive an invoice from [facility]. Please contact [facility] at [phone] with questions or concerns regarding your invoice.   IF you received labwork today, you will  receive an invoice from [place]. Please contact [facility] at [phone] with questions or concerns regarding your invoice.   Our billing staff will not be able to assist you with questions regarding bills from these companies.  You will be contacted with the lab results as soon as they are available. The fastest way to get your results is to activate your My Chart account. Instructions are located on the last page of this paperwork. If you have not heard from [place] regarding the results in 2 weeks, please contact this office.     Diabetes Mellitus and Nutrition, Adult When you have diabetes, or diabetes mellitus, it is very important to have healthy eating habits because your blood sugar (glucose) levels are greatly affected by what you eat and drink. Eating healthy foods in the right amounts, at about the same times every day, can help you:  Control your blood glucose.  Lower your risk of heart disease.  Improve your blood pressure.  Reach or maintain a healthy weight. What can affect my meal plan? Every person with diabetes is different, and each person has different needs for a meal plan. Your health care provider may recommend that you work with a dietitian to make a meal plan that is best for you. Your meal plan may vary depending on factors such as:  The calories you need.  The medicines you take.  Your weight.  Your blood glucose, blood pressure, and cholesterol levels.  Your activity level.  Other health conditions you have, such as heart or kidney disease. How do carbohydrates affect me? Carbohydrates, also called carbs, affect your blood glucose level more than any other type of food. Eating carbs naturally raises the amount of glucose in your blood. Carb counting is a method for keeping track of how many carbs you eat. Counting carbs is important to keep your blood glucose at a healthy level, especially if you use insulin or take certain oral diabetes medicines. It is  important to know how many carbs you can safely have in each meal. This is different for every person. Your dietitian can help you calculate how many  carbs you should have at each meal and for each snack. How does alcohol affect me? Alcohol can cause a sudden decrease in blood glucose (hypoglycemia), especially if you use insulin or take certain oral diabetes medicines. Hypoglycemia can be a life-threatening condition. Symptoms of hypoglycemia, such as sleepiness, dizziness, and confusion, are similar to symptoms of having too much alcohol.  Do not drink alcohol if: ? Your health care provider tells you not to drink. ? You are pregnant, may be pregnant, or are planning to become pregnant.  If you drink alcohol: ? Do not drink on an empty stomach. ? Limit how much you use to:  0-1 drink a day for women.  0-2 drinks a day for men. ? Be aware of how much alcohol is in your drink. In the [place] one drink equals one 12 oz bottle of beer (355 mL), one 5 oz glass of wine (148 mL), or one 1 oz glass of hard liquor (44 mL). ? Keep yourself hydrated with water, diet soda, or unsweetened iced tea.  Keep in mind that regular soda, juice, and other mixers may contain a lot of sugar and must be counted as carbs. What are tips for following this plan? Reading food labels  Start by checking the serving size on the "Nutrition Facts" label of packaged foods and drinks. The amount of calories, carbs, fats, and other nutrients listed on the label is based on one serving of the item. Many items contain more than one serving per package.  Check the total grams (g) of carbs in one serving. You can calculate the number of servings of carbs in one serving by dividing the total carbs by 15. For example, if a food has 30 g of total carbs per serving, it would be equal to 2 servings of carbs.  Check the number of grams (g) of saturated fats and trans fats in one serving. Choose foods that have a low amount or none of  these fats.  Check the number of milligrams (mg) of salt (sodium) in one serving. Most people should limit total sodium intake to less than 2,300 mg per day.  Always check the nutrition information of foods labeled as "low-fat" or "nonfat." These foods may be higher in added sugar or refined carbs and should be avoided.  Talk to your dietitian to identify your daily goals for nutrients listed on the label. Shopping  Avoid buying canned, pre-made, or processed foods. These foods tend to be high in fat, sodium, and added sugar.  Shop around the outside edge of the grocery store. This is where you will most often find fresh fruits and vegetables, bulk grains, fresh meats, and fresh dairy. Cooking  Use low-heat cooking methods, such as baking, instead of high-heat cooking methods like deep frying.  Cook using healthy oils, such as olive, canola, or sunflower oil.  Avoid cooking with butter, cream, or high-fat meats. Meal planning  Eat meals and snacks regularly, preferably at the same times every day. Avoid going long periods of time without eating.  Eat foods that are high in fiber, such as fresh fruits, vegetables, beans, and whole grains. Talk with your dietitian about how many servings of carbs you can eat at each meal.  Eat 4-6 oz (112-168 g) of lean protein each day, such as lean meat, chicken, fish, eggs, or tofu. One ounce (oz) of lean protein is equal to: ? 1 oz (28 g) of meat, chicken, or fish. ? 1 egg. ?  cup (  62 g) of tofu.  Eat some foods each day that contain healthy fats, such as avocado, nuts, seeds, and fish.   What foods should I eat? Fruits Berries. Apples. Oranges. Peaches. Apricots. Plums. Grapes. Mango. Papaya. Pomegranate. Kiwi. Cherries. Vegetables Lettuce. Spinach. Leafy greens, including kale, chard, collard greens, and mustard greens. Beets. Cauliflower. Cabbage. Broccoli. Carrots. Green beans. Tomatoes. Peppers. Onions. Cucumbers. Brussels  sprouts. Grains Whole grains, such as whole-wheat or whole-grain bread, crackers, tortillas, cereal, and pasta. Unsweetened oatmeal. Quinoa. Brown or wild rice. Meats and other proteins Seafood. Poultry without skin. Lean cuts of poultry and beef. Tofu. Nuts. Seeds. Dairy Low-fat or fat-free dairy products such as milk, yogurt, and cheese. The items listed above may not be a complete list of foods and beverages you can eat. Contact a dietitian for more information. What foods should I avoid? Fruits Fruits canned with syrup. Vegetables Canned vegetables. Frozen vegetables with butter or cream sauce. Grains Refined white flour and flour products such as bread, pasta, snack foods, and cereals. Avoid all processed foods. Meats and other proteins Fatty cuts of meat. Poultry with skin. Breaded or fried meats. Processed meat. Avoid saturated fats. Dairy Full-fat yogurt, cheese, or milk. Beverages Sweetened drinks, such as soda or iced tea. The items listed above may not be a complete list of foods and beverages you should avoid. Contact a dietitian for more information. Questions to ask a health care provider  Do I need to meet with a diabetes educator?  Do I need to meet with a dietitian?  What number can I call if I have questions?  When are the best times to check my blood glucose? Where to find more information:  American Diabetes Association: diabetes.org  Academy of Nutrition and Dietetics: www.eatright.[employer] of Diabetes and Digestive and Kidney Diseases: [employer]  Association of Diabetes Care and Education Specialists: www.diabeteseducator.org Summary  It is important to have healthy eating habits because your blood sugar (glucose) levels are greatly affected by what you eat and drink.  A healthy meal plan will help you control your blood glucose and maintain a healthy lifestyle.  Your health care provider may recommend that you work with a  dietitian to make a meal plan that is best for you.  Keep in mind that carbohydrates (carbs) and alcohol have immediate effects on your blood glucose levels. It is important to count carbs and to use alcohol carefully. This information is not intended to replace advice given to you by your health care provider. Make sure you discuss any questions you have with your health care provider. Document Revised: 12/28/2018 Document Reviewed: 12/28/2018 Elsevier Patient Education  2021 Elsevier Inc.      Agustina Caroli, MD [facility]

## 2020-04-24 NOTE — Assessment & Plan Note (Signed)
Uncontrolled hypertension.  Continue present medication.  No changes. Uncontrolled diabetes with elevated A1c at 7.1, higher than before.  Has also gained some weight.  Continue Metformin 1000 mg twice a day Start weekly Trulicity 5.86 mg. Diet and nutrition discussed. Continue atorvastatin and daily baby aspirin. Follow-up in 3 months.

## 2020-07-03 IMAGING — DX DG CHEST 2V
2 series · 2 of 2 positions shown · non-contrast
Comparison: None available

CLINICAL DATA: Previous abnormal chest x-ray.

EXAM:
CHEST - 2 VIEW

[chest pa]
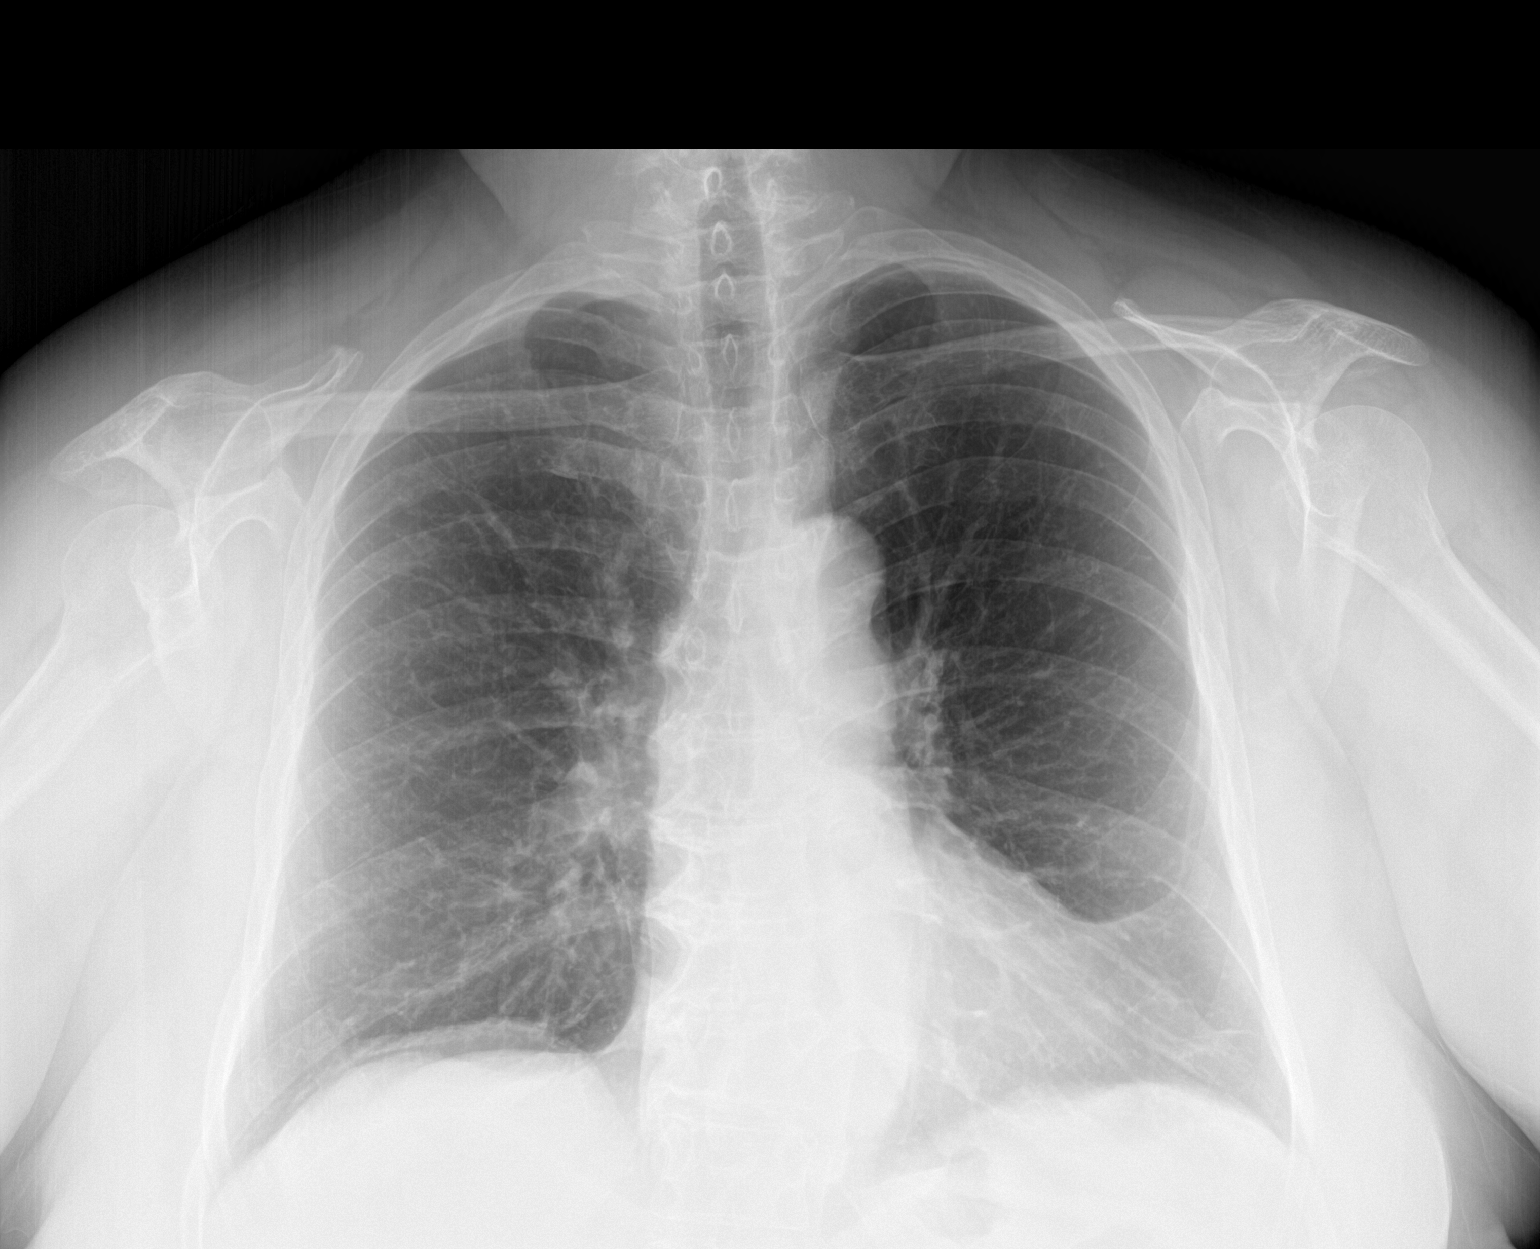

[chest lat]
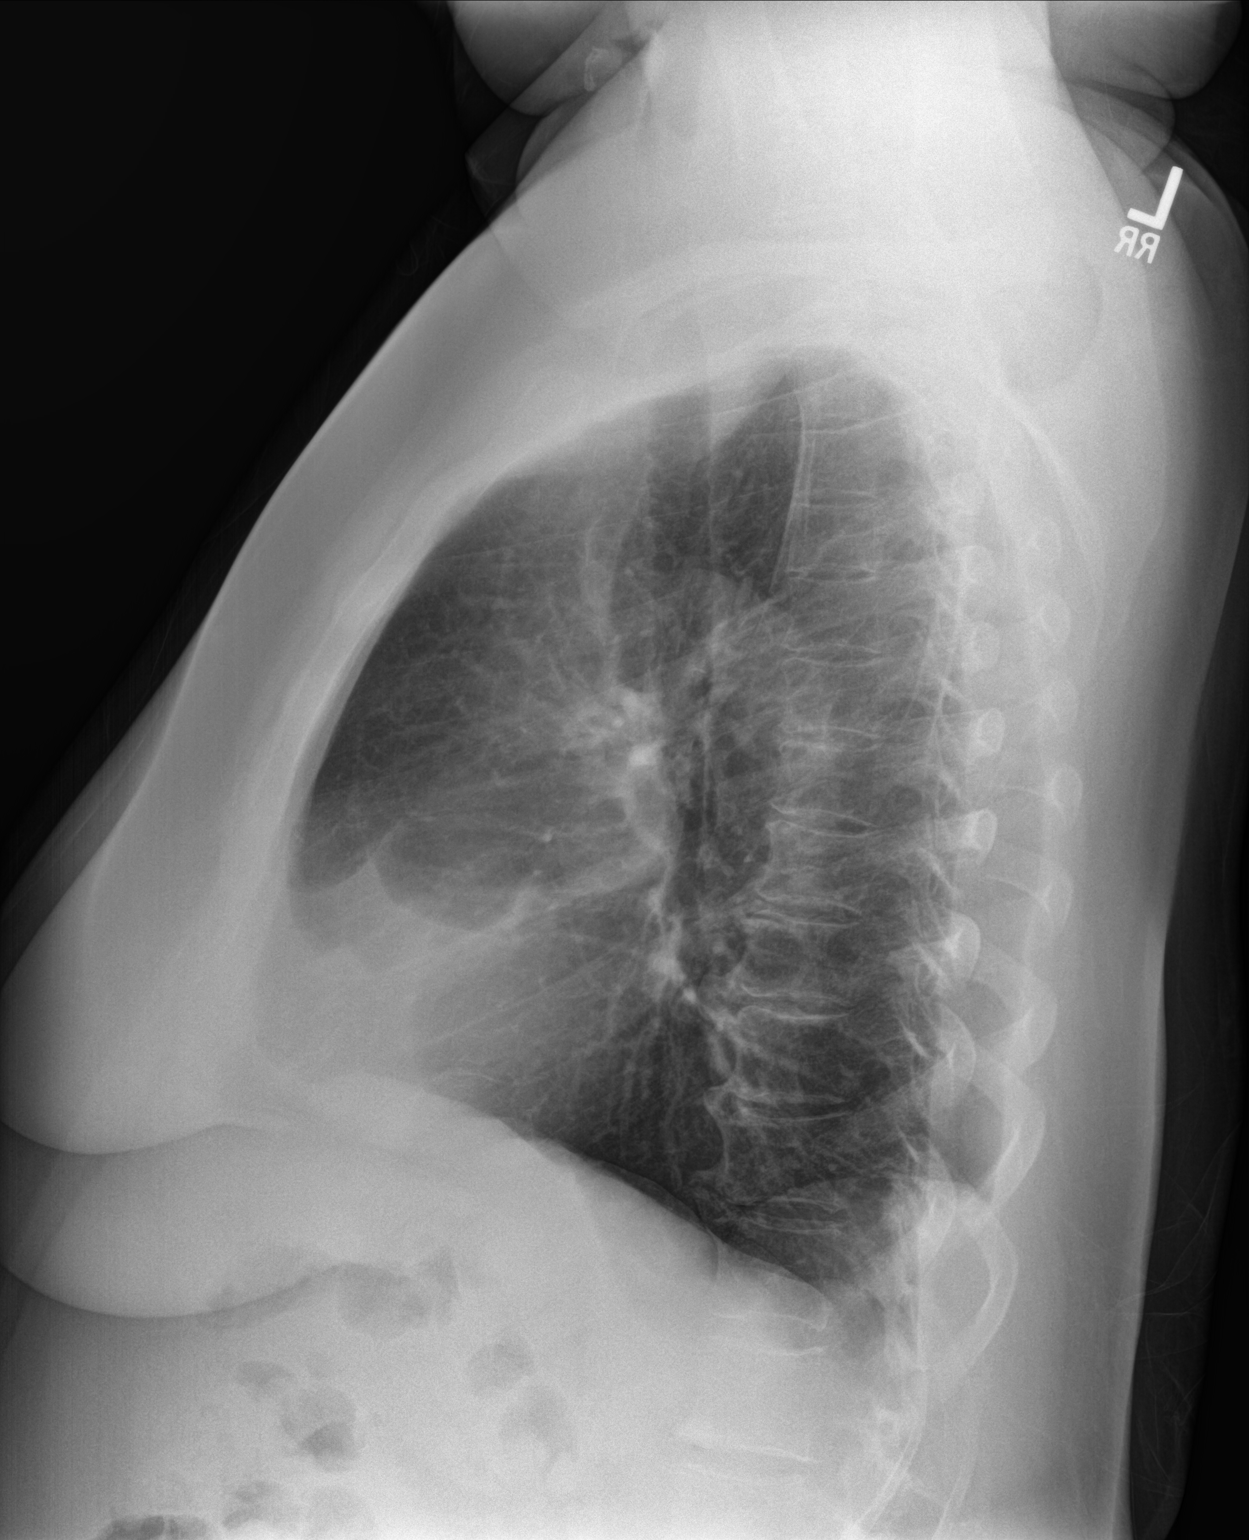

[2 of 2 positions shown; findings below may reference images not displayed]

FINDINGS: Linear densities in the lingula likely reflects scarring. Lungs
otherwise clear. No effusions. Heart is normal size. No acute bony
abnormality.
IMPRESSION: Lingular scarring.  No active disease.

## 2020-07-26 ENCOUNTER — Other Ambulatory Visit: Payer: Self-pay

## 2020-07-26 ENCOUNTER — Ambulatory Visit (INDEPENDENT_AMBULATORY_CARE_PROVIDER_SITE_OTHER): Payer: 59 | Admitting: Emergency Medicine

## 2020-07-26 ENCOUNTER — Encounter: Payer: Self-pay | Admitting: Emergency Medicine

## 2020-07-26 VITALS — BP 138/78 | HR 85 | Temp 98.7°F | Ht 62.0 in | Wt 225.0 lb

## 2020-07-26 DIAGNOSIS — I152 Hypertension secondary to endocrine disorders: Secondary | ICD-10-CM | POA: Diagnosis not present

## 2020-07-26 DIAGNOSIS — E1169 Type 2 diabetes mellitus with other specified complication: Secondary | ICD-10-CM | POA: Diagnosis not present

## 2020-07-26 DIAGNOSIS — M8949 Other hypertrophic osteoarthropathy, multiple sites: Secondary | ICD-10-CM | POA: Diagnosis not present

## 2020-07-26 DIAGNOSIS — Z8709 Personal history of other diseases of the respiratory system: Secondary | ICD-10-CM

## 2020-07-26 DIAGNOSIS — E1159 Type 2 diabetes mellitus with other circulatory complications: Secondary | ICD-10-CM

## 2020-07-26 DIAGNOSIS — M159 Polyosteoarthritis, unspecified: Secondary | ICD-10-CM

## 2020-07-26 DIAGNOSIS — E785 Hyperlipidemia, unspecified: Secondary | ICD-10-CM

## 2020-07-26 DIAGNOSIS — K432 Incisional hernia without obstruction or gangrene: Secondary | ICD-10-CM | POA: Insufficient documentation

## 2020-07-26 DIAGNOSIS — R011 Cardiac murmur, unspecified: Secondary | ICD-10-CM

## 2020-07-26 DIAGNOSIS — J4541 Moderate persistent asthma with (acute) exacerbation: Secondary | ICD-10-CM

## 2020-07-26 LAB — POCT GLYCOSYLATED HEMOGLOBIN (HGB A1C): Hemoglobin A1C: 6.9 % — AB (ref 4.0–5.6)

## 2020-07-26 MED ORDER — AMLODIPINE BESYLATE 10 MG PO TABS: 10.0000 mg | ORAL_TABLET | Freq: Every day | ORAL | 3 refills | Status: DC

## 2020-07-26 MED ORDER — ALBUTEROL SULFATE HFA 108 (90 BASE) MCG/ACT IN AERS: 2.0000 | INHALATION_SPRAY | Freq: Four times a day (QID) | RESPIRATORY_TRACT | 3 refills | Status: DC | PRN

## 2020-07-26 NOTE — Assessment & Plan Note (Signed)
Systolic murmur.  Needs cardiology evaluation and echocardiogram.

## 2020-07-26 NOTE — Assessment & Plan Note (Signed)
Diet and nutrition discussed.  Advised to decrease amount of daily carbohydrate intake in her diet.  Continue atorvastatin 40 mg daily.

## 2020-07-26 NOTE — Patient Instructions (Addendum)
Hernia, Adult     A hernia happens when tissue inside your body pushes out through a weak spot in your belly muscles (abdominal wall). This makes a round lump (bulge). The lump may be: In a scar from surgery that was done in your belly (incisional hernia). Near your belly button (umbilical hernia). In your groin (inguinal hernia). Your groin is the area where your leg meets your lower belly (abdomen). This kind of hernia could also be: In your scrotum, if you are female. In folds of skin around your vagina, if you are female. In your upper thigh (femoral hernia). Inside your belly (hiatal hernia). This happens when your stomach slides above the muscle between your belly and your chest (diaphragm). If your hernia is small and it does not cause pain, you may not need treatment.If your hernia is large or it causes pain, you may need surgery. Follow these instructions at home: Activity Avoid stretching or overusing (straining) the muscles near your hernia. Straining can happen when you: Lift something heavy. Poop (have a bowel movement). Do not lift anything that is heavier than 10 lb (4.5 kg), or the limit that you are told, until your doctor says that it is safe. Use the strength of your legs when you lift something heavy. Do not use only your back muscles to lift. General instructions Do these things if told by your doctor so you do not have trouble pooping (constipation): Drink enough fluid to keep your pee (urine) pale yellow. Eat foods that are high in fiber. These include fresh fruits and vegetables, whole grains, and beans. Limit foods that are high in fat and processed sugars. These include foods that are fried or sweet. Take medicine for trouble pooping. When you cough, try to cough gently. You may try to push your hernia in by very gently pressing on it when you are lying down. Do not try to force the bulge back in if it will not push in easily. If you are overweight, work with your  doctor to lose weight safely. Do not use any products that have nicotine or tobacco in them. These include cigarettes and e-cigarettes. If you need help quitting, ask your doctor. If you will be having surgery (hernia repair), watch your hernia for changes in shape, size, or color. Tell your doctor if you see any changes. Take over-the-counter and prescription medicines only as told by your doctor. Keep all follow-up visits as told by your doctor. Contact a doctor if: You get new pain, swelling, or redness near your hernia. You poop fewer times in a week than normal. You have trouble pooping. You have poop (stool) that is more dry than normal. You have poop that is harder or larger than normal. Get help right away if: You have a fever. You have belly pain that gets worse. You feel sick to your stomach (nauseous). You throw up (vomit). Your hernia cannot be pushed in by very gently pressing on it when you are lying down. Do not try to force the bulge back in if it will not push in easily. Your hernia: Changes in shape or size. Changes color. Feels hard or it hurts when you touch it. These symptoms may represent a serious problem that is an emergency. Do not wait to see if the symptoms will go away. Get medical help right away. Call your local emergency services (911 in the [place] Summary A hernia happens when tissue inside your body pushes out through a weak spot in  the belly muscles. This creates a bulge. If your hernia is small and it does not hurt, you may not need treatment. If your hernia is large or it hurts, you may need surgery. If you will be having surgery, watch your hernia for changes in shape, size, or color. Tell your doctor about any changes. This information is not intended to replace advice given to you by your health care provider. Make sure you discuss any questions you have with your healthcare provider. Document Revised: 05/13/2018 Document Reviewed:  10/22/2016 Elsevier Patient Education  [place]. Diabetes mellitus y nutricin, en adultos Diabetes Mellitus and Nutrition, Adult Si sufre de diabetes, o diabetes mellitus, es muy importante tener hbitos alimenticios saludables debido a que sus niveles de Designer, television/film set sangre (glucosa) se ven afectados en gran medida por lo que come y bebe. Comer alimentos saludables en las cantidades correctas, aproximadamente a la misma hora todos los [place], [place] ayudar a: Aeronautical engineer glucemia. Disminuir el riesgo de sufrir una enfermedad cardaca. Mejorar la presin arterial. Science writer o mantener un peso saludable. Qu puede afectar mi plan de alimentacin? Todas las personas que sufren de diabetes son diferentes y cada una tiene necesidades diferentes en cuanto a un plan de alimentacin. El mdico puede recomendarle que trabaje con un nutricionista para elaborar el mejor plan para usted. Su plan de alimentacin puede variar segn factores como: Las caloras que necesita. Los medicamentos que toma. Su peso. Sus niveles de glucemia, presin arterial y colesterol. Su nivel de [place]. Otras afecciones que tenga, como enfermedades cardacas o renales. Cmo me afectan los carbohidratos? Los carbohidratos, o hidratos de carbono, afectan su nivel de glucemia ms que cualquier otro tipo de alimento. La ingesta de carbohidratos naturalmente aumenta la cantidad de [employer]. El recuento de carbohidratos es un mtodo destinado a Catering manager un registro de la cantidad de carbohidratos que se consumen. El recuento de carbohidratos es importante para Theatre manager la glucemia a un nivel saludable, especialmente si utiliza insulina o toma determinadosmedicamentos por va oral para la diabetes. Es importante conocer la cantidad de carbohidratos que se pueden ingerir en cada comida sin correr Engineer, manufacturing. Esto es Psychologist, forensic. Su nutricionista puede ayudarlo a calcular la cantidad de carbohidratos  que debeingerir en cada comida y en cada refrigerio. Cmo me afecta el alcohol? El alcohol puede provocar disminuciones sbitas de la glucemia (hipoglucemia), especialmente si utiliza insulina o toma determinados medicamentos por va oral para la diabetes. La hipoglucemia es una afeccin potencialmente mortal. Los sntomas de la hipoglucemia, como somnolencia, mareos y confusin, son similares a los sntomas de haber consumido demasiado alcohol. No beba alcohol si: Su mdico le indica no hacerlo. Est embarazada, puede estar embarazada o est tratando de [place]. Si bebe alcohol: No beba con el estmago vaco. Limite la cantidad que bebe: De 0 a 1 medida por da para las mujeres. De 0 a 2 medidas por da para los hombres. Est atento a la cantidad de alcohol que hay en las bebidas que toma. En los [place], una medida equivale a una botella de cerveza de 12 oz (355 ml), un vaso de vino de 5 oz (148 ml) o un vaso de una bebida alcohlica de alta graduacin de 1 oz (44 ml). Mantngase hidratado bebiendo agua, refrescos dietticos o t helado sin azcar. Tenga en cuenta que los refrescos comunes, los jugos y otras bebida para Optician, dispensing pueden contener mucha azcar y se deben contar como carbohidratos. Consejos para  seguir Company secretary las etiquetas de los alimentos Comience por leer el tamao de la porcin en la "Informacin nutricional" en las etiquetas de los alimentos envasados y las bebidas. La cantidad de caloras, carbohidratos, grasas y otros nutrientes mencionados en la etiqueta se basan en una porcin del alimento. Muchos alimentos contienen ms de una porcin por envase. Verifique la cantidad total de gramos (g) de carbohidratos totales en una porcin. Puede calcular la cantidad de porciones de carbohidratos al dividir el total de carbohidratos por 15. Por ejemplo, si un alimento tiene un total de 30 g de carbohidratos totales por porcin, equivale a 2 porciones de  carbohidratos. Verifique la cantidad de gramos (g) de grasas saturadas y grasas trans de una porcin. Escoja alimentos que no contengan estas grasas o que su contenido de estas sea [place]. Verifique la cantidad de miligramos (mg) de sal (sodio) en una porcin. [place] [employer] de las personas deben limitar la ingesta de sodio total a menos de 2300 mg [employer]. Siempre consulte la informacin nutricional de los alimentos etiquetados como "con bajo contenido de grasa" o "sin grasa". Estos alimentos pueden tener un mayor contenido de Location manager agregada o carbohidratos refinados, y deben evitarse. Hable con su nutricionista para identificar sus objetivos diarios en cuanto a los nutrientes mencionados en la etiqueta. Al ir de compras Evite comprar alimentos procesados, enlatados o precocidos. Estos alimentos tienden a Special educational needs teacher mayor cantidad de [place], sodio y azcar agregada. Compre en la zona exterior de la tienda de comestibles. Esta es la zona donde se encuentran con mayor frecuencia las frutas y las verduras frescas, los cereales a granel, las carnes frescas y los productos lcteos frescos. Al cocinar Utilice mtodos de coccin a baja temperatura, como hornear, en lugar de mtodos de coccin a alta temperatura, como frer en abundante aceite. Cocine con aceites saludables, como el aceite de [place], canola o [place]. Evite cocinar con manteca, crema o carnes con alto contenido de grasa. Planificacin de las comidas Coma las comidas y los refrigerios regularmente, preferentemente a la misma hora todos [place]. Evite pasar largos perodos de tiempo sin comer. Consuma alimentos ricos en fibra, como frutas frescas, verduras, frijoles y cereales integrales. Consulte a su nutricionista sobre cuntas porciones de carbohidratos puede consumir en cada comida. Consuma entre 4 y 6 onzas (entre 112 y 168 g) de protenas magras por da, como carnes [place], pollo, pescado, huevos o tofu. Una onza (oz) de protena magra equivale  a: 1 onza (28 g) de carne, pollo o pescado. 1 huevo.  de taza (62 g) de tofu. Coma algunos alimentos por da que contengan grasas saludables, como aguacates, frutos secos, semillas y pescado. Qu alimentos debo comer? Lambert Mody Bayas. Manzanas. Naranjas. Duraznos. Damascos. Ciruelas. Uvas. Mango. Papaya.[place]. Kiwi. Cerezas. [place] Valeda Malm. Espinaca. Verduras de [employer], que incluyen col rizada, [place], hojas de [place] y de [place]. Remolachas. Coliflor. Repollo. Brcoli. Zanahorias. Judas verdes. Tomates. Pimientos. Cebollas. Pepinos. Coles deBruselas. Granos Granos integrales, como panes, galletas, tortillas, cereales y pastas desalvado o integrales. Avena sin azcar. Quinua. Arroz integral o salvaje. Carnes y Psychiatric nurse. Carne de ave sin piel. Cortes magros de ave y carne de res. Tofu.Frutos secos. Semillas. Lcteos Productos lcteos sin grasa o con bajo contenido de [place], [place], yogur [place]. Es posible que los productos que se enumeran ms [place] no constituyan una lista completa de los alimentos y las bebidas que puede tomar. Consulte a un nutricionista para obtener ms informacin. Qu alimentos debo evitar? Lambert Mody  Frutas enlatadas al almbar. Verduras Verduras enlatadas. Verduras congeladas con mantequilla o salsa de crema. Granos Productos elaborados con [place] y [place], como panes, pastas,bocadillos y cereales. Evite todos los alimentos procesados. Carnes y otras protenas Cortes de carne con alto contenido de Lobbyist. Carne de ave con piel. Carnesempanizadas o fritas. Carne procesada. Evite las grasas saturadas. Lcteos Yogur, [place] enteros. Bebidas Bebidas azucaradas, como gaseosas o t helado. Es posible que los productos que se enumeran ms [place] no constituyan una lista completa de los alimentos y las bebidas que Nurse, adult. Consulte a un nutricionista para obtener ms informacin. Preguntas para hacerle al mdico Es  necesario que me rena con Radio broadcast assistant en el cuidado de la diabetes? Es necesario que me rena con un nutricionista? A qu nmero puedo llamar si tengo preguntas? Cules son los mejores momentos para controlar la glucemia? Dnde encontrar ms informacin: Asociacin Estadounidense de la Diabetes (American Diabetes Association): diabetes.org Academy of Nutrition and Dietetics (Academia de Nutricin y Information systems manager): www.eatright.Un[employer]ys Corporation of Diabetes and Digestive and Kidney Diseases ([place] la Diabetes y [place] y [place]): DesMoinesFuneral.dk Association of Diabetes Care and Education Specialists (Asociacin de Especialistas en Atencin y Educacin sobre la Diabetes): www.diabeteseducator.org Resumen Es importante tener hbitos alimenticios saludables debido a que sus niveles de Designer, television/film set sangre (glucosa) se ven afectados en gran medida por lo que come y bebe. Un plan de alimentacin saludable lo ayudar a controlar la glucemia y Theatre manager un estilo de vida saludable. El mdico puede recomendarle que trabaje con un nutricionista para elaborar el mejor plan para usted. Tenga en cuenta que los carbohidratos (hidratos de carbono) y el alcohol tienen efectos inmediatos en sus niveles de glucemia. Es importante contar los carbohidratos que ingiere y consumir alcohol con prudencia. Esta informacin no tiene Marine scientist el consejo del mdico. Asegresede hacerle al mdico cualquier pregunta que tenga. Document Revised: 02/24/2019 Document Reviewed: 02/24/2019 Elsevier Patient Education  2021 [employer].

## 2020-07-26 NOTE — Assessment & Plan Note (Addendum)
Elevated blood pressure readings at home and in the office. Continue Hyzaar 100-12.5 mg daily and increase amlodipine to 10 mg daily. Well-controlled diabetes with hemoglobin A1c of 6.9.  Continue metformin 1000 mg twice a day and increase Trulicity to 1.5 mg weekly.

## 2020-07-26 NOTE — Progress Notes (Signed)
Catherine Cline 65 y.o.   Chief Complaint  Patient presents with   Diabetes    Follow up    HISTORY OF PRESENT ILLNESS: This is a 65 y.o. female with history of diabetes here for follow-up. Presently on weekly Trulicity 1.61 mg and metformin 1000 mg twice a day.  Glucose at home 120s in the morning and 140s in the afternoon History of hypertension on amlodipine 5 mg daily and Hyzaar 100-12.5 mg daily.  Blood pressures readings at home 140s over 90s Also history of moderate persistent asthma.  On Symbicort twice a day.  Needs refill on albuterol.  Feels like her asthma is getting trigger more often. History of dyslipidemia on atorvastatin 40 mg daily.  Diabetes Pertinent negatives for hypoglycemia include no dizziness or headaches. Pertinent negatives for diabetes include no chest pain.    Prior to Admission medications   Medication Sig Start Date End Date Taking? Authorizing Provider  albuterol (VENTOLIN HFA) 108 (90 Base) MCG/ACT inhaler Inhale 2 puffs into the lungs every 6 (six) hours as needed for wheezing or shortness of breath. 01/25/20   Horald Pollen, MD  amLODipine (NORVASC) 5 MG tablet Take 1 tablet (5 mg total) by mouth daily. 01/25/20 04/24/20  Horald Pollen, MD  aspirin EC 81 MG tablet Take 81 mg by mouth in the morning and at bedtime.     [provider]  atorvastatin (LIPITOR) 40 MG tablet Take 1 tablet (40 mg total) by mouth daily. 01/25/20   Horald Pollen, MD  budesonide-formoterol [facility]) 80-4.5 MCG/ACT inhaler Inhale 2 puffs into the lungs 2 (two) times daily. 03/12/20   Horald Pollen, MD  Cholecalciferol (VITAMIN D3) 5000 units CAPS Take by mouth daily.    [provider]  Dulaglutide (TRULICITY) 0.96 EA/5.4UJ SOPN Inject 0.75 mg into the skin once a week. 04/24/20   Horald Pollen, MD  KRILL OIL PO Take by mouth daily.    [provider]  Levocetirizine Dihydrochloride (XYZAL ALLERGY 24HR PO) Take by  mouth.    [provider]  losartan-hydrochlorothiazide (HYZAAR) 100-12.5 MG tablet Take 1 tablet by mouth daily. 01/25/20   Horald Pollen, MD  metFORMIN (GLUCOPHAGE) 1000 MG tablet Take 1 tablet (1,000 mg total) by mouth 2 (two) times daily with a meal. 01/25/20   Elfrieda Espino, Ines Bloomer, MD  Multiple Vitamin (MULTIVITAMIN) tablet Take 1 tablet by mouth daily.    [provider]    No Known Allergies  Patient Active Problem List   Diagnosis Date Noted   Body mass index (BMI) of 40.1-44.9 in adult ([place]) 10/12/2019   Moderate persistent asthma with acute exacerbation 07/13/2019   Primary osteoarthritis involving multiple joints 07/14/2018   Chronic joint pain 07/14/2018   Dyslipidemia associated with type 2 diabetes mellitus ([place]) 01/14/2018   Hypertension associated with diabetes ([place]) 07/07/2017   Hyperlipidemia 07/07/2017    Past Medical History:  Diagnosis Date   Arthritis    Asthma    Hypertension     Past Surgical History:  Procedure Laterality Date   OVARIAN CYST REMOVAL      Social History   Socioeconomic History   Marital status: Single    Spouse name: Not on file   Number of children: Not on file   Years of education: Not on file   Highest education level: Not on file  Occupational History   Not on file  Tobacco Use   Smoking status: Never   Smokeless tobacco: Never  Substance and Sexual Activity   Alcohol use: Not Currently    Comment: socially   Drug use: Never   Sexual activity: Not on file  Other Topics Concern   Not on file  Social History Narrative   Not on file   Social Determinants of Health   Financial Resource Strain: Not on file  Food Insecurity: Not on file  Transportation Needs: Not on file  Physical Activity: Not on file  Stress: Not on file  Social Connections: Not on file  Intimate Partner Violence: Not on file    Family History  Problem Relation Age of Onset   Heart disease Mother    Hypertension  Mother    Stroke Mother    Diabetes Sister    Hypertension Sister      Review of Systems  Constitutional: Negative.  Negative for chills and fever.  HENT: Negative.  Negative for congestion and sore throat.   Respiratory: Negative.  Negative for cough and shortness of breath.   Cardiovascular: Negative.  Negative for chest pain and palpitations.  Gastrointestinal:  Negative for abdominal pain, blood in stool, diarrhea, nausea and vomiting.  Genitourinary: Negative.  Negative for dysuria and hematuria.  Skin: Negative.  Negative for rash.  Neurological:  Negative for dizziness and headaches.  All other systems reviewed and are negative.   Physical Exam Vitals reviewed.  Constitutional:      Appearance: Normal appearance.  HENT:     Head: Normocephalic.  Eyes:     Extraocular Movements: Extraocular movements intact.     Pupils: Pupils are equal, round, and reactive to light.  Cardiovascular:     Rate and Rhythm: Normal rate and regular rhythm.     Pulses: Normal pulses.     Heart sounds: Murmur heard.  Systolic murmur is present with a grade of 2/6.  Pulmonary:     Effort: Pulmonary effort is normal.     Breath sounds: Normal breath sounds.  Musculoskeletal:        General: Normal range of motion.     Cervical back: Normal range of motion and neck supple.  Skin:    General: Skin is warm and dry.     Capillary Refill: Capillary refill takes less than 2 seconds.  Neurological:     General: No focal deficit present.     Mental Status: She is alert and oriented to person, place, and time.  Psychiatric:        Mood and Affect: Mood normal.        Behavior: Behavior normal.    Results for orders placed or performed in visit on 07/26/20 (from the past 24 hour(s))  POCT glycosylated hemoglobin (Hb A1C)     Status: Abnormal   Collection Time: 07/26/20  8:29 AM  Result Value Ref Range   Hemoglobin A1C 6.9 (A) 4.0 - 5.6 %   HbA1c POC (<> result, manual entry)     HbA1c, POC  (prediabetic range)     HbA1c, POC (controlled diabetic range)      ASSESSMENT & PLAN: A total of 40 minutes was spent with the patient and counseling/coordination of care regarding cardiovascular risks associated with diabetes and hypertension, review of all medications and need to increase dose of amlodipine to 10 mg daily and Trulicity 1.5 mg weekly, review of most recent blood work results including today's hemoglobin A1c, education on nutrition and need to decrease amount of daily carbohydrate intake, diagnosis of incisional hernia and need for surgical evaluation and treatment,  finding of heart murmur and need for cardiology evaluation and echocardiogram, review of most recent office visit notes, treatment and management of moderate persistent asthma with Symbicort and albuterol, prognosis, documentation and need for follow-up in 3 months.  Hypertension associated with diabetes ([place]) Elevated blood pressure readings at home and in the office. Continue Hyzaar 100-12.5 mg daily and increase amlodipine to 10 mg daily. Well-controlled diabetes with hemoglobin A1c of 6.9.  Continue metformin 1000 mg twice a day and increase Trulicity to 1.5 mg weekly.  Moderate persistent asthma with acute exacerbation Presently asymptomatic.  Continue Symbicort twice a day. Needs refill on albuterol.  Dyslipidemia associated with type 2 diabetes mellitus ([place]) Diet and nutrition discussed.  Advised to decrease amount of daily carbohydrate intake in her diet.  Continue atorvastatin 40 mg daily.  Incisional hernia, without obstruction or gangrene Starting to affect quality of life but no complications yet.  Needs surgical referral for evaluation and treatment.   Heart murmur Systolic murmur.  Needs cardiology evaluation and echocardiogram. Mayrani was seen today for diabetes.  Diagnoses and all orders for this visit:  Hypertension associated with diabetes ([place]) -     POCT glycosylated hemoglobin (Hb A1C) -      amLODipine (NORVASC) 10 MG tablet; Take 1 tablet (10 mg total) by mouth daily.  Dyslipidemia associated with type 2 diabetes mellitus (H[facility])  Primary osteoarthritis involving multiple joints  Heart murmur -     Ambulatory referral to Cardiology  Incisional hernia, without obstruction or gangrene -     Ambulatory referral to General Surgery  History of asthma -     albuterol (VENTOLIN HFA) 108 (90 Base) MCG/ACT inhaler; Inhale 2 puffs into the lungs every 6 (six) hours as needed for wheezing or shortness of breath.  Moderate persistent asthma with acute exacerbation  Patient Instructions  Hernia, Adult     A hernia happens when tissue inside your body pushes out through a weak spot in your belly muscles (abdominal wall). This makes a round lump (bulge). The lump may be: In a scar from surgery that was done in your belly (incisional hernia). Near your belly button (umbilical hernia). In your groin (inguinal hernia). Your groin is the area where your leg meets your lower belly (abdomen). This kind of hernia could also be: In your scrotum, if you are female. In folds of skin around your vagina, if you are female. In your upper thigh (femoral hernia). Inside your belly (hiatal hernia). This happens when your stomach slides above the muscle between your belly and your chest (diaphragm). If your hernia is small and it does not cause pain, you may not need treatment.If your hernia is large or it causes pain, you may need surgery. Follow these instructions at home: Activity Avoid stretching or overusing (straining) the muscles near your hernia. Straining can happen when you: Lift something heavy. Poop (have a bowel movement). Do not lift anything that is heavier than 10 lb (4.5 kg), or the limit that you are told, until your doctor says that it is safe. Use the strength of your legs when you lift something heavy. Do not use only your back muscles to lift. General instructions Do these  things if told by your doctor so you do not have trouble pooping (constipation): Drink enough fluid to keep your pee (urine) pale yellow. Eat foods that are high in fiber. These include fresh fruits and vegetables, whole grains, and beans. Limit foods that are high in fat and processed sugars.  These include foods that are fried or sweet. Take medicine for trouble pooping. When you cough, try to cough gently. You may try to push your hernia in by very gently pressing on it when you are lying down. Do not try to force the bulge back in if it will not push in easily. If you are overweight, work with your doctor to lose weight safely. Do not use any products that have nicotine or tobacco in them. These include cigarettes and e-cigarettes. If you need help quitting, ask your doctor. If you will be having surgery (hernia repair), watch your hernia for changes in shape, size, or color. Tell your doctor if you see any changes. Take over-the-counter and prescription medicines only as told by your doctor. Keep all follow-up visits as told by your doctor. Contact a doctor if: You get new pain, swelling, or redness near your hernia. You poop fewer times in a week than normal. You have trouble pooping. You have poop (stool) that is more dry than normal. You have poop that is harder or larger than normal. Get help right away if: You have a fever. You have belly pain that gets worse. You feel sick to your stomach (nauseous). You throw up (vomit). Your hernia cannot be pushed in by very gently pressing on it when you are lying down. Do not try to force the bulge back in if it will not push in easily. Your hernia: Changes in shape or size. Changes color. Feels hard or it hurts when you touch it. These symptoms may represent a serious problem that is an emergency. Do not wait to see if the symptoms will go away. Get medical help right away. Call your local emergency services (911 in the [place] Summary A  hernia happens when tissue inside your body pushes out through a weak spot in the belly muscles. This creates a bulge. If your hernia is small and it does not hurt, you may not need treatment. If your hernia is large or it hurts, you may need surgery. If you will be having surgery, watch your hernia for changes in shape, size, or color. Tell your doctor about any changes. This information is not intended to replace advice given to you by your health care provider. Make sure you discuss any questions you have with your healthcare provider. Document Revised: 05/13/2018 Document Reviewed: 10/22/2016 Elsevier Patient Education  [place]. Diabetes mellitus y nutricin, en adultos Diabetes Mellitus and Nutrition, Adult Si sufre de diabetes, o diabetes mellitus, es muy importante tener hbitos alimenticios saludables debido a que sus niveles de Designer, television/film set sangre (glucosa) se ven afectados en gran medida por lo que come y bebe. Comer alimentos saludables en las cantidades correctas, aproximadamente a la misma hora todos los [place], [place] ayudar a: Aeronautical engineer glucemia. Disminuir el riesgo de sufrir una enfermedad cardaca. Mejorar la presin arterial. Science writer o mantener un peso saludable. Qu puede afectar mi plan de alimentacin? Todas las personas que sufren de diabetes son diferentes y cada una tiene necesidades diferentes en cuanto a un plan de alimentacin. El mdico puede recomendarle que trabaje con un nutricionista para elaborar el mejor plan para usted. Su plan de alimentacin puede variar segn factores como: Las caloras que necesita. Los medicamentos que toma. Su peso. Sus niveles de glucemia, presin arterial y colesterol. Su nivel de [place]. Otras afecciones que tenga, como enfermedades cardacas o renales. Cmo me afectan los carbohidratos? Los carbohidratos, o hidratos de carbono, afectan su nivel de  glucemia ms que cualquier otro tipo de alimento. La ingesta de  carbohidratos naturalmente aumenta la cantidad de [employer]. El recuento de carbohidratos es un mtodo destinado a Catering manager un registro de la cantidad de carbohidratos que se consumen. El recuento de carbohidratos es importante para Theatre manager la glucemia a un nivel saludable, especialmente si utiliza insulina o toma determinadosmedicamentos por va oral para la diabetes. Es importante conocer la cantidad de carbohidratos que se pueden ingerir en cada comida sin correr Engineer, manufacturing. Esto es Psychologist, forensic. Su nutricionista puede ayudarlo a calcular la cantidad de carbohidratos que debeingerir en cada comida y en cada refrigerio. Cmo me afecta el alcohol? El alcohol puede provocar disminuciones sbitas de la glucemia (hipoglucemia), especialmente si utiliza insulina o toma determinados medicamentos por va oral para la diabetes. La hipoglucemia es una afeccin potencialmente mortal. Los sntomas de la hipoglucemia, como somnolencia, mareos y confusin, son similares a los sntomas de haber consumido demasiado alcohol. No beba alcohol si: Su mdico le indica no hacerlo. Est embarazada, puede estar embarazada o est tratando de [place]. Si bebe alcohol: No beba con el estmago vaco. Limite la cantidad que bebe: De 0 a 1 medida por da para las mujeres. De 0 a 2 medidas por da para los hombres. Est atento a la cantidad de alcohol que hay en las bebidas que toma. En los [place], una medida equivale a una botella de cerveza de 12 oz (355 ml), un vaso de vino de 5 oz (148 ml) o un vaso de una bebida alcohlica de alta graduacin de 1 oz (44 ml). Mantngase hidratado bebiendo agua, refrescos dietticos o t helado sin azcar. Tenga en cuenta que los refrescos comunes, los jugos y otras bebida para Optician, dispensing pueden contener mucha azcar y se deben contar como carbohidratos. Consejos para seguir Company secretary las etiquetas de los alimentos Comience por leer el  tamao de la porcin en la "Informacin nutricional" en las etiquetas de los alimentos envasados y las bebidas. La cantidad de caloras, carbohidratos, grasas y otros nutrientes mencionados en la etiqueta se basan en una porcin del alimento. Muchos alimentos contienen ms de una porcin por envase. Verifique la cantidad total de gramos (g) de carbohidratos totales en una porcin. Puede calcular la cantidad de porciones de carbohidratos al dividir el total de carbohidratos por 15. Por ejemplo, si un alimento tiene un total de 30 g de carbohidratos totales por porcin, equivale a 2 porciones de carbohidratos. Verifique la cantidad de gramos (g) de grasas saturadas y grasas trans de una porcin. Escoja alimentos que no contengan estas grasas o que su contenido de estas sea [place]. Verifique la cantidad de miligramos (mg) de sal (sodio) en una porcin. La [employer] de las personas deben limitar la ingesta de sodio total a menos de 2300 mg Honeywell. Siempre consulte la informacin nutricional de los alimentos etiquetados como "con bajo contenido de grasa" o "sin grasa". Estos alimentos pueden tener un mayor contenido de Location manager agregada o carbohidratos refinados, y deben evitarse. Hable con su nutricionista para identificar sus objetivos diarios en cuanto a los nutrientes mencionados en la etiqueta. Al ir de compras Evite comprar alimentos procesados, enlatados o precocidos. Estos alimentos tienden a Special educational needs teacher mayor cantidad de [place], sodio y azcar agregada. Compre en la zona exterior de la tienda de comestibles. Esta es la zona donde se encuentran con mayor frecuencia las frutas y las verduras frescas, los cereales a granel, las carnes frescas y  los productos lcteos frescos. Al cocinar Utilice mtodos de coccin a baja temperatura, como hornear, en lugar de mtodos de coccin a alta temperatura, como frer en abundante aceite. Cocine con aceites saludables, como el aceite de [place], canola o [place]. Evite  cocinar con manteca, crema o carnes con alto contenido de grasa. Planificacin de las comidas Coma las comidas y los refrigerios regularmente, preferentemente a la misma hora todos [place]. Evite pasar largos perodos de tiempo sin comer. Consuma alimentos ricos en fibra, como frutas frescas, verduras, frijoles y cereales integrales. Consulte a su nutricionista sobre cuntas porciones de carbohidratos puede consumir en cada comida. Consuma entre 4 y 6 onzas (entre 112 y 168 g) de protenas magras por da, como carnes Speculator, pollo, pescado, huevos o tofu. Una onza (oz) de protena magra equivale a: 1 onza (28 g) de carne, pollo o pescado. 1 huevo.  de taza (62 g) de tofu. Coma algunos alimentos por da que contengan grasas saludables, como aguacates, frutos secos, semillas y pescado. Qu alimentos debo comer? Lambert Mody Bayas. Manzanas. Naranjas. Duraznos. Damascos. Ciruelas. Uvas. Mango. Papaya.[place]. Kiwi. Cerezas. [place] Valeda Malm. [place]. Verduras de Boeing, que incluyen col rizada, [place], hojas de [place] y de [place]. Remolachas. Coliflor. Repollo. Brcoli. Zanahorias. Judas verdes. Tomates. Pimientos. Cebollas. Pepinos. Coles deBruselas. Granos Granos integrales, como panes, galletas, tortillas, cereales y pastas desalvado o integrales. Avena sin azcar. Quinua. Arroz integral o salvaje. Carnes y Psychiatric nurse. Carne de ave sin piel. Cortes magros de ave y carne de res. Tofu.Frutos secos. Semillas. Lcteos Productos lcteos sin grasa o con bajo contenido de [place], [place], yogur [place]. Es posible que los productos que se enumeran ms [place] no constituyan una lista completa de los alimentos y las bebidas que puede tomar. Consulte a un nutricionista para obtener ms informacin. Qu alimentos debo evitar? Lambert Mody Frutas enlatadas al almbar. Verduras Verduras enlatadas. Verduras congeladas con mantequilla o salsa de crema. Granos Productos elaborados con [place]  y [place], como panes, pastas,bocadillos y cereales. Evite todos los alimentos procesados. Carnes y otras protenas Cortes de carne con alto contenido de Lobbyist. Carne de ave con piel. Carnesempanizadas o fritas. Carne procesada. Evite las grasas saturadas. Lcteos Yogur, [place] enteros. Bebidas Bebidas azucaradas, como gaseosas o t helado. Es posible que los productos que se enumeran ms [place] no constituyan una lista completa de los alimentos y las bebidas que Nurse, adult. Consulte a un nutricionista para obtener ms informacin. Preguntas para hacerle al mdico Es necesario que me rena con Radio broadcast assistant en el cuidado de la diabetes? Es necesario que me rena con un nutricionista? A qu nmero puedo llamar si tengo preguntas? Cules son los mejores momentos para controlar la glucemia? Dnde encontrar ms informacin: Asociacin Estadounidense de la Diabetes (American Diabetes Association): diabetes.org Academy of Nutrition and Dietetics (Academia de Nutricin y Information systems manager): www.eatright.Unisys Corporation of Diabetes and Digestive and Kidney Diseases ([place] la Diabetes y [place] y [place]): [place]Funeral.dk Association of Diabetes Care and Education Specialists (Asociacin de Especialistas en Atencin y Educacin sobre la Diabetes): www.diabeteseducator.org Resumen Es importante tener hbitos alimenticios saludables debido a que sus niveles de Designer, television/film set sangre (glucosa) se ven afectados en gran medida por lo que come y bebe. Un plan de alimentacin saludable lo ayudar a controlar la glucemia y Theatre manager un estilo de vida saludable. El mdico puede recomendarle que trabaje con un nutricionista para elaborar el mejor plan para usted. Tenga en cuenta que los carbohidratos (hidratos  de carbono) y el alcohol tienen efectos inmediatos en sus niveles de glucemia. Es importante contar los carbohidratos que ingiere y consumir  alcohol con prudencia. Esta informacin no tiene Marine scientist el consejo del mdico. Asegresede hacerle al mdico cualquier pregunta que tenga. Document Revised: 02/24/2019 Document Reviewed: 02/24/2019 Elsevier Patient Education  2021 [place], MD [facility]

## 2020-07-26 NOTE — Assessment & Plan Note (Signed)
Presently asymptomatic.  Continue Symbicort twice a day. Needs refill on albuterol.

## 2020-07-26 NOTE — Assessment & Plan Note (Signed)
Starting to affect quality of life but no complications yet.  Needs surgical referral for evaluation and treatment.

## 2020-07-31 ENCOUNTER — Other Ambulatory Visit: Payer: Self-pay

## 2020-07-31 ENCOUNTER — Ambulatory Visit (INDEPENDENT_AMBULATORY_CARE_PROVIDER_SITE_OTHER): Payer: 59 | Admitting: Internal Medicine

## 2020-07-31 ENCOUNTER — Encounter: Payer: Self-pay | Admitting: Internal Medicine

## 2020-07-31 VITALS — BP 138/74 | HR 90 | Ht 62.0 in | Wt 227.0 lb

## 2020-07-31 DIAGNOSIS — E785 Hyperlipidemia, unspecified: Secondary | ICD-10-CM | POA: Diagnosis not present

## 2020-07-31 DIAGNOSIS — R0600 Dyspnea, unspecified: Secondary | ICD-10-CM | POA: Diagnosis not present

## 2020-07-31 DIAGNOSIS — R011 Cardiac murmur, unspecified: Secondary | ICD-10-CM

## 2020-07-31 DIAGNOSIS — Z6841 Body Mass Index (BMI) 40.0 and over, adult: Secondary | ICD-10-CM

## 2020-07-31 DIAGNOSIS — I447 Left bundle-branch block, unspecified: Secondary | ICD-10-CM

## 2020-07-31 DIAGNOSIS — J4541 Moderate persistent asthma with (acute) exacerbation: Secondary | ICD-10-CM

## 2020-07-31 DIAGNOSIS — I152 Hypertension secondary to endocrine disorders: Secondary | ICD-10-CM

## 2020-07-31 DIAGNOSIS — R0609 Other forms of dyspnea: Secondary | ICD-10-CM

## 2020-07-31 DIAGNOSIS — E1159 Type 2 diabetes mellitus with other circulatory complications: Secondary | ICD-10-CM | POA: Diagnosis not present

## 2020-07-31 NOTE — Patient Instructions (Signed)
Medication Instructions:  No Changes In Medications at this time.  *If you need a refill on your cardiac medications before your next appointment, please call your pharmacy*  Testing/Procedures: Your physician has requested that you have a lexiscan myoview. For further information please visit [email or web address]. Please follow instruction sheet, as given.This will take place at [address]  The test will take approximately 3 to 4 hours to complete; you may bring reading material.  If someone comes with you to your appointment, they will need to remain in the main lobby due to limited space in the testing area.    How to prepare for your Myocardial Perfusion Test: Do not eat or drink 3 hours prior to your test, except you may have water. Do not consume products containing caffeine (regular or decaffeinated) 12 hours prior to your test. (ex: coffee, chocolate, sodas, tea). Do wear comfortable clothes (no dresses or overalls) and walking shoes, tennis shoes preferred (No heels or open toe shoes are allowed). Do NOT wear cologne, perfume, aftershave, or lotions (deodorant is allowed). If you use an inhaler, use it the AM of your test and bring it with you.  If you use a nebulizer, use it the AM of your test.  If these instructions are not followed, your test will have to be rescheduled.  Your physician has requested that you have an echocardiogram. Echocardiography is a painless test that uses sound waves to create images of your heart. It provides your doctor with information about the size and shape of your heart and how well your heart's chambers and valves are working. You may receive an ultrasound enhancing agent through an IV if needed to better visualize your heart during the echo.This procedure takes approximately one hour. There are no restrictions for this procedure. This will take place at the [address].   Follow-Up: At [facility], you and your  health needs are our priority.  As part of our continuing mission to provide you with exceptional heart care, we have created designated Provider Care Teams.  These Care Teams include your primary Cardiologist (physician) and Advanced Practice Providers (APPs -  Physician Assistants and Nurse Practitioners) who all work together to provide you with the care you need, when you need it.  Your next appointment:   AFTER TESTING   The format for your next appointment:   In Person  Provider:   Cherlynn Kaiser, MD

## 2020-07-31 NOTE — Progress Notes (Signed)
Cardiology Office Note:    Date:  07/31/2020   ID:  Catherine MCALEER, DOB [birth year], MRN [id number]  PCP:  Horald Pollen, MD  Cardiologist:  None  Electrophysiologist:  None   Referring MD: Horald Pollen, *   Chief Complaint/Reason for Referral: Heart Murmur  History of Present Illness:    Catherine Cline is a 65 y.o. female with a history of arthritis, asthma, diabetes, and hypertension here for the initial evaluation and management of heart murmur. She was last seen by Agustina Caroli, MD on 07/26/2020 for follow-up. At that visit, physical exam revealed grade 2/6 systolic murmur. She was referred to cardiology for evaluation and echocardiogram.  Today, she states her main issues are mostly related to arthritis. She has chest pressure/tightness during an asthma attack, but this resolves when her asthma is treated. She confirms general shortness of breath as well that she attributes to asthma. Usually, she hears mild wheezing at night, and not normally during the day. She wheezes less when she is propped upright in bed. Also, she feels occasional "fluttering" in her chest when supine in bed. However, she has not felt this in the past 2-3 months.   She also has exertional shortness of breath that she does not attribute to asthma. When she walks on a flat surface she is typically stable. When she walks up an incline she becomes short of breath and needs to stop and rest. She is unable to push through the dyspnea. She believes this is comparable to climbing up 5-10 stairs.  She has never been a smoker. She does not drink alcohol or use recreational substances.  Of note, she previously underwent a left knee arthroplasty. As a child, she usually developed asthma and bronchitis every year. In her family, her mother has angina controlled with medication.  No history of congenital heart disease, no significant surgeries or major hospitalizations in childhood.  She denies any  headaches, lightheadedness, or syncope. Also has no lower extremity edema, orthopnea or PND.   Past Medical History:  Diagnosis Date   Arthritis    Asthma    Hypertension     Past Surgical History:  Procedure Laterality Date   OVARIAN CYST REMOVAL      Current Medications: Current Meds  Medication Sig   albuterol (VENTOLIN HFA) 108 (90 Base) MCG/ACT inhaler Inhale 2 puffs into the lungs every 6 (six) hours as needed for wheezing or shortness of breath.   amLODipine (NORVASC) 10 MG tablet Take 1 tablet (10 mg total) by mouth daily.   aspirin EC 81 MG tablet Take 81 mg by mouth in the morning and at bedtime.    atorvastatin (LIPITOR) 40 MG tablet Take 1 tablet (40 mg total) by mouth daily.   budesonide-formoterol (SYMBICORT) 80-4.5 MCG/ACT inhaler Inhale 2 puffs into the lungs 2 (two) times daily.   Cholecalciferol (VITAMIN D3) 5000 units CAPS Take by mouth daily.   Dulaglutide (TRULICITY) 6.71 IW/5.8KD SOPN Inject 0.75 mg into the skin once a week. (Patient taking differently: Inject 0.75 mg into the skin once a week. Patient administers two injections once a week)   KRILL OIL PO Take by mouth daily.   Levocetirizine Dihydrochloride (XYZAL ALLERGY 24HR PO) Take by mouth.   losartan-hydrochlorothiazide (HYZAAR) 100-12.5 MG tablet Take 1 tablet by mouth daily.   metFORMIN (GLUCOPHAGE) 1000 MG tablet Take 1 tablet (1,000 mg total) by mouth 2 (two) times daily with a meal.   Multiple Vitamin (MULTIVITAMIN) tablet Take 1 tablet  by mouth daily.     Allergies:   Patient has no known allergies.   Social History   Tobacco Use   Smoking status: Never   Smokeless tobacco: Never  Substance Use Topics   Alcohol use: Not Currently    Comment: socially   Drug use: Never     Family History: The patient's family history includes Diabetes in her sister; Heart disease in her mother; Hypertension in her mother and sister; Stroke in her mother.  ROS:   Please see the history of present  illness.    (+) Chest pressure/tightness during asthma attack (+) Shortness of breath (+) Heart "fluttering" (+) Wheezing (+) Joint pain All other systems reviewed and are negative.  EKGs/Labs/Other Studies Reviewed:    The following studies were reviewed today: No prior CV studies available.  EKG:  07/31/2020: Sinus rhythm, LBBB, rate 90 bpm, QRS duration of 140 ms  Recent Labs: 01/25/2020: ALT 47; BUN 21; Creatinine, Ser 0.85; Potassium 4.7; Sodium 139  Recent Lipid Panel    Component Value Date/Time   CHOL 149 10/12/2019 0941   TRIG 133 10/12/2019 0941   HDL 46 10/12/2019 0941   CHOLHDL 3.2 10/12/2019 0941   LDLCALC 80 10/12/2019 0941    Physical Exam:    VS:  BP 138/74 (BP Location: Left Arm, Patient Position: Sitting, Cuff Size: Large)   Pulse 90   Ht 5\' 2"  (1.575 m)   Wt 227 lb (103 kg)   SpO2 96%   BMI 41.52 kg/m     Wt Readings from Last 5 Encounters:  07/31/20 227 lb (103 kg)  07/26/20 225 lb (102.1 kg)  04/24/20 226 lb (102.5 kg)  01/25/20 221 lb (100.2 kg)  10/12/19 220 lb (99.8 kg)    Constitutional: No acute distress Eyes: sclera non-icteric, normal conjunctiva and lids ENMT: normal dentition, moist mucous membranes Cardiovascular: regular rhythm, normal rate, 1/6 systolic murmur best heard at the LUSB. S1 and S2 normal. Radial pulses normal bilaterally. No jugular venous distention.  Respiratory: clear to auscultation bilaterally GI : normal bowel sounds, soft and nontender. No distention.   MSK: extremities warm, well perfused. No edema.  NEURO: grossly nonfocal exam, moves all extremities. PSYCH: alert and oriented x 3, normal mood and affect.   ASSESSMENT:    1. Dyspnea on exertion   2. Left bundle branch block   3. Hypertension associated with diabetes ([place])   4. Hyperlipidemia, unspecified hyperlipidemia type   5. Heart murmur   6. Body mass index (BMI) of 40.1-44.9 in adult ([place])   7. Moderate persistent asthma with acute exacerbation     PLAN:    Dyspnea on exertion - Plan: EKG 12-Lead, MYOCARDIAL PERFUSION IMAGING, ECHOCARDIOGRAM COMPLETE  -With a history of hypertension, diabetes, hyperlipidemia, she has risk factors for CAD and is intermediate risk for obstructive CAD.  With symptoms of chest discomfort and shortness of breath with activity would recommend [place] for evaluation of ischemia.  We discussed options for alternate stress testing including coronary CTA.  Her heart rate is 90 at baseline and she has a new left bundle branch block.  This makes me somewhat hesitant to give high-dose beta-blockade though fortunately she has been asymptomatic with a left bundle branch block and no definite sign of presyncope or syncope.  Left bundle branch block - Plan: EKG 12-Lead, MYOCARDIAL PERFUSION IMAGING, ECHOCARDIOGRAM COMPLETE -Would like to exclude ischemia prior to her hernia surgery with new left bundle branch block.  She is having symptoms  of possible cardiac equivalent with dyspnea on exertion, will evaluate with echocardiogram and stress Myoview.  Hypertension associated with diabetes (HCC)-blood pressure with reasonably good control today, continue amlodipine, losartan HCTZ.  Hyperlipidemia, unspecified hyperlipidemia type-she has diabetes and is on Lipitor 40 mg daily, would continue.  Last LDL is 80, this is quite near her goal.  Heart murmur-I hear a very faint systolic murmur on exam today.  I suspect her murmur at her primary care doctor's office may have been a flow murmur, nevertheless we are getting an echocardiogram with her left bundle branch block and will evaluate the valves at that time.  Body mass index (BMI) of 40.1-44.9 in adult (HCC)-if no ischemia on stress test, will make formal recommendations about an exercise program.  Moderate persistent asthma with acute exacerbation-overall stable on albuterol and Symbicort, though she is having more symptoms in the summertime as compared to her younger  years.  Follow-up after testing.   Cherlynn Kaiser, MD, [place]   Shared Decision Making/Informed Consent:   Shared Decision Making/Informed Consen The risks [chest pain, shortness of breath, cardiac arrhythmias, dizziness, blood pressure fluctuations, myocardial infarction, stroke/transient ischemic attack, nausea, vomiting, allergic reaction, radiation exposure, metallic taste sensation and life-threatening complications (estimated to be 1 in 10,000)], benefits (risk stratification, diagnosing coronary artery disease, treatment guidance) and alternatives of a nuclear stress test were discussed in detail with Ms. Lacks and she agrees to proceed.   Medication Adjustments/Labs and Tests Ordered: Current medicines are reviewed at length with the patient today.  Concerns regarding medicines are outlined above.   No orders of the defined types were placed in this encounter.   No orders of the defined types were placed in this encounter.   Patient Instructions  Medication Instructions:  No Changes In Medications at this time.  *If you need a refill on your cardiac medications before your next appointment, please call your pharmacy*  Testing/Procedures: Your physician has requested that you have a lexiscan myoview. For further information please visit [email or web address]. Please follow instruction sheet, as given.This will take place at [address]  The test will take approximately 3 to 4 hours to complete; you may bring reading material.  If someone comes with you to your appointment, they will need to remain in the main lobby due to limited space in the testing area.    How to prepare for your Myocardial Perfusion Test: Do not eat or drink 3 hours prior to your test, except you may have water. Do not consume products containing caffeine (regular or decaffeinated) 12 hours prior to your test. (ex: coffee, chocolate, sodas, tea). Do wear  comfortable clothes (no dresses or overalls) and walking shoes, tennis shoes preferred (No heels or open toe shoes are allowed). Do NOT wear cologne, perfume, aftershave, or lotions (deodorant is allowed). If you use an inhaler, use it the AM of your test and bring it with you.  If you use a nebulizer, use it the AM of your test.  If these instructions are not followed, your test will have to be rescheduled.  Your physician has requested that you have an echocardiogram. Echocardiography is a painless test that uses sound waves to create images of your heart. It provides your doctor with information about the size and shape of your heart and how well your heart's chambers and valves are working. You may receive an ultrasound enhancing agent through an IV if needed to better visualize your heart  during the echo.This procedure takes approximately one hour. There are no restrictions for this procedure. This will take place at the [address].   Follow-Up: At [facility], you and your health needs are our priority.  As part of our continuing mission to provide you with exceptional heart care, we have created designated Provider Care Teams.  These Care Teams include your primary Cardiologist (physician) and Advanced Practice Providers (APPs -  Physician Assistants and Nurse Practitioners) who all work together to provide you with the care you need, when you need it.  Your next appointment:   AFTER TESTING   The format for your next appointment:   In Person  Provider:   Cherlynn Kaiser, MD    [facility],acting as a scribe for Elouise Munroe, MD.,have documented all relevant documentation on the behalf of Elouise Munroe, MD,as directed by  Elouise Munroe, MD while in the presence of Elouise Munroe, MD.  I, Elouise Munroe, MD, have reviewed all documentation for this visit. The documentation on 07/31/20 for the exam, diagnosis, procedures, and orders are all  accurate and complete.

## 2020-08-09 ENCOUNTER — Telehealth: Payer: Self-pay | Admitting: Emergency Medicine

## 2020-08-09 DIAGNOSIS — E1159 Type 2 diabetes mellitus with other circulatory complications: Secondary | ICD-10-CM

## 2020-08-09 DIAGNOSIS — I152 Hypertension secondary to endocrine disorders: Secondary | ICD-10-CM

## 2020-08-09 NOTE — Telephone Encounter (Signed)
Patient requesting refill for losartan-hydrochlorothiazide (HYZAAR) 100-12.5 MG tablet   [place], [place]

## 2020-08-10 NOTE — Telephone Encounter (Signed)
Patient requesting refill for losartan-hydrochlorothiazide (HYZAAR) 100-12.5 MG tablet     [place], [place]

## 2020-08-13 MED ORDER — LOSARTAN POTASSIUM-HCTZ 100-12.5 MG PO TABS: 1.0000 | ORAL_TABLET | Freq: Every day | ORAL | 3 refills | Status: DC

## 2020-08-13 NOTE — Telephone Encounter (Signed)
Refilled medications

## 2020-08-14 ENCOUNTER — Telehealth (HOSPITAL_COMMUNITY): Payer: Self-pay | Admitting: *Deleted

## 2020-08-14 NOTE — Telephone Encounter (Signed)
Patient given detailed instructions per Myocardial Perfusion Study Information Sheet for the test on 08/20/20 Patient notified to arrive 15 minutes early and that it is imperative to arrive on time for appointment to keep from having the test rescheduled.  If you need to cancel or reschedule your appointment, please call the office within 24 hours of your appointment. . Patient verbalized understanding. [name] [name]   

## 2020-08-20 ENCOUNTER — Ambulatory Visit (HOSPITAL_COMMUNITY): Payer: 59 | Attending: Internal Medicine

## 2020-08-20 ENCOUNTER — Other Ambulatory Visit: Payer: Self-pay

## 2020-08-20 DIAGNOSIS — I447 Left bundle-branch block, unspecified: Secondary | ICD-10-CM

## 2020-08-20 DIAGNOSIS — R0600 Dyspnea, unspecified: Secondary | ICD-10-CM | POA: Insufficient documentation

## 2020-08-20 DIAGNOSIS — R0609 Other forms of dyspnea: Secondary | ICD-10-CM

## 2020-08-20 MED ORDER — TECHNETIUM TC 99M TETROFOSMIN IV KIT
32.6000 | PACK | Freq: Once | INTRAVENOUS | Status: AC | PRN
  Administered 2020-08-20: 32.6 via INTRAVENOUS
  Filled 2020-08-20: qty 33

## 2020-08-20 MED ORDER — REGADENOSON 0.4 MG/5ML IV SOLN
0.4000 mg | Freq: Once | INTRAVENOUS | Status: AC
  Administered 2020-08-20: 0.4 mg via INTRAVENOUS

## 2020-08-21 ENCOUNTER — Ambulatory Visit (HOSPITAL_COMMUNITY): Payer: 59 | Attending: Internal Medicine

## 2020-08-21 LAB — MYOCARDIAL PERFUSION IMAGING
LV dias vol: 84 mL (ref 46–106)
LV sys vol: 41 mL
Peak HR: 100 {beats}/min
Rest HR: 83 {beats}/min
SDS: 1
SRS: 0
SSS: 1
TID: 0.97

## 2020-08-21 MED ORDER — TECHNETIUM TC 99M TETROFOSMIN IV KIT
31.6000 | PACK | Freq: Once | INTRAVENOUS | Status: AC | PRN
  Administered 2020-08-21: 31.6 via INTRAVENOUS
  Filled 2020-08-21: qty 32

## 2020-08-23 ENCOUNTER — Ambulatory Visit (HOSPITAL_COMMUNITY): Payer: 59 | Attending: Internal Medicine

## 2020-08-23 ENCOUNTER — Other Ambulatory Visit: Payer: Self-pay

## 2020-08-23 DIAGNOSIS — R0609 Other forms of dyspnea: Secondary | ICD-10-CM

## 2020-08-23 DIAGNOSIS — R0600 Dyspnea, unspecified: Secondary | ICD-10-CM | POA: Diagnosis present

## 2020-08-23 DIAGNOSIS — I447 Left bundle-branch block, unspecified: Secondary | ICD-10-CM

## 2020-08-23 LAB — ECHOCARDIOGRAM COMPLETE
AR max vel: 1.95 cm2
AV Area VTI: 1.95 cm2
AV Area mean vel: 1.88 cm2
AV Mean grad: 7.7 mmHg
AV Peak grad: 14.2 mmHg
Ao pk vel: 1.89 m/s
Area-P 1/2: 3.91 cm2
S' Lateral: 3.4 cm

## 2020-08-28 ENCOUNTER — Telehealth: Payer: Self-pay | Admitting: Emergency Medicine

## 2020-08-28 DIAGNOSIS — E1165 Type 2 diabetes mellitus with hyperglycemia: Secondary | ICD-10-CM

## 2020-08-28 MED ORDER — TRULICITY 0.75 MG/0.5ML ~~LOC~~ SOAJ: 1.5000 mg | SUBCUTANEOUS | 1 refills | Status: DC

## 2020-08-28 MED ORDER — TRULICITY 1.5 MG/0.5ML ~~LOC~~ SOAJ: 1.5000 mg | SUBCUTANEOUS | 1 refills | Status: DC

## 2020-08-28 NOTE — Telephone Encounter (Signed)
Pt has called stating that her Trulicity rx was sent into her Pharmacy still at the 0.'75MG'$  dosage instead of the 1.'5MG'$  dosage. Pt states she does not want to keep poking her self twice with the 0.'75MG'$  dose pen.

## 2020-08-28 NOTE — Addendum Note (Signed)
Addended by: Elza Rafter D on: 08/28/2020 03:29 PM   Modules accepted: Orders

## 2020-08-28 NOTE — Telephone Encounter (Signed)
Pt notified that correct dosage pen sent to pharmacy.  Pt appreciative & verb understanding.

## 2020-08-28 NOTE — Telephone Encounter (Signed)
Reviewed last OV notes from 07/26/20: Continue metformin 1000 mg twice a day and increase Trulicity to 1.5 mg weekly. Will send new dosage prescription per above.

## 2020-08-28 NOTE — Telephone Encounter (Signed)
1.Medication Requested: Dulaglutide (TRULICITY) [id number] SOPN  Pat states Dr increased doseage & now she is out  2. Pharmacy (Name, Street, [place]):  [facility] [place] Phone:  [phone]  Fax:  [phone]     3. On Med List: Yes   4. Last Visit with PCP: 07/26/2020  5. Next visit date with PCP: 10/31/2020   Agent: Please be advised that RX refills may take up to 3 business days. We ask that you follow-up with your pharmacy.

## 2020-09-13 ENCOUNTER — Other Ambulatory Visit: Payer: Self-pay | Admitting: General Surgery

## 2020-09-13 DIAGNOSIS — K430 Incisional hernia with obstruction, without gangrene: Secondary | ICD-10-CM

## 2020-09-20 ENCOUNTER — Other Ambulatory Visit: Payer: Self-pay | Admitting: Emergency Medicine

## 2020-09-20 DIAGNOSIS — J4541 Moderate persistent asthma with (acute) exacerbation: Secondary | ICD-10-CM

## 2020-10-02 ENCOUNTER — Encounter: Payer: Self-pay | Admitting: Internal Medicine

## 2020-10-02 ENCOUNTER — Other Ambulatory Visit: Payer: Self-pay

## 2020-10-02 ENCOUNTER — Ambulatory Visit (INDEPENDENT_AMBULATORY_CARE_PROVIDER_SITE_OTHER): Payer: 59 | Admitting: Internal Medicine

## 2020-10-02 VITALS — BP 124/82 | HR 82 | Ht 62.0 in | Wt 218.6 lb

## 2020-10-02 DIAGNOSIS — I447 Left bundle-branch block, unspecified: Secondary | ICD-10-CM

## 2020-10-02 DIAGNOSIS — E785 Hyperlipidemia, unspecified: Secondary | ICD-10-CM

## 2020-10-02 DIAGNOSIS — E1159 Type 2 diabetes mellitus with other circulatory complications: Secondary | ICD-10-CM

## 2020-10-02 DIAGNOSIS — I152 Hypertension secondary to endocrine disorders: Secondary | ICD-10-CM

## 2020-10-02 DIAGNOSIS — J4540 Moderate persistent asthma, uncomplicated: Secondary | ICD-10-CM

## 2020-10-02 DIAGNOSIS — R0609 Other forms of dyspnea: Secondary | ICD-10-CM

## 2020-10-02 DIAGNOSIS — K432 Incisional hernia without obstruction or gangrene: Secondary | ICD-10-CM

## 2020-10-02 DIAGNOSIS — R011 Cardiac murmur, unspecified: Secondary | ICD-10-CM

## 2020-10-02 DIAGNOSIS — Z01810 Encounter for preprocedural cardiovascular examination: Secondary | ICD-10-CM | POA: Diagnosis not present

## 2020-10-02 DIAGNOSIS — R0600 Dyspnea, unspecified: Secondary | ICD-10-CM | POA: Diagnosis not present

## 2020-10-02 NOTE — Progress Notes (Signed)
Cardiology Office Note:    Date:  10/02/2020   ID:  Catherine Cline, DOB [birth year], MRN [id number]  PCP:  Horald Pollen, MD  Cardiologist:  None  Electrophysiologist:  None   Referring MD: Horald Pollen, *   Chief Complaint/Reason for Referral: Heart Murmur, dyspnea on exertion  History of Present Illness:    Catherine Cline is a 65 y.o. female with a history of arthritis, asthma, diabetes, and hypertension here for the initial evaluation and management of heart murmur, dyspnea on exertion.   Still awaiting umbilical hernia surgery. Scans planned for this week. Remains somewhat short of breath with activity.  We discussed reassuring results of echocardiogram with no valvular heart disease to contribute to worrisome source of murmur, and normal nuclear stress test with apical anterior artifact likely from breast attenuation.  We discussed that nuclear stress test with artifact, in the setting of persistent or worsening symptoms, should be further investigated for ischemia, however at this time results appear to be low risk.  We discussed possibility of deconditioning and I have encouraged her with these reassuring stress test and echo results to increase her activity level as tolerated.  The patient denies chest pain, chest pressure, dyspnea at rest, palpitations, PND, orthopnea, or leg swelling. Denies cough, fever, chills. Denies nausea, vomiting. Denies syncope or presyncope. Denies dizziness or lightheadedness.     Past Medical History:  Diagnosis Date   Arthritis    Asthma    Hypertension     Past Surgical History:  Procedure Laterality Date   OVARIAN CYST REMOVAL      Current Medications: Current Meds  Medication Sig   albuterol (VENTOLIN HFA) 108 (90 Base) MCG/ACT inhaler Inhale 2 puffs into the lungs every 6 (six) hours as needed for wheezing or shortness of breath.   amLODipine (NORVASC) 10 MG tablet Take 1 tablet (10 mg total) by mouth daily.   aspirin  EC 81 MG tablet Take 81 mg by mouth in the morning and at bedtime.    atorvastatin (LIPITOR) 40 MG tablet Take 1 tablet (40 mg total) by mouth daily.   Cholecalciferol (VITAMIN D3) 5000 units CAPS Take by mouth daily.   Dulaglutide (TRULICITY) 1.5 0000000 SOPN Inject 1.5 mg into the skin once a week.   KRILL OIL PO Take by mouth daily.   Levocetirizine Dihydrochloride (XYZAL ALLERGY 24HR PO) Take by mouth.   losartan-hydrochlorothiazide (HYZAAR) 100-12.5 MG tablet Take 1 tablet by mouth daily.   metFORMIN (GLUCOPHAGE) 1000 MG tablet Take 1 tablet (1,000 mg total) by mouth 2 (two) times daily with a meal.   Multiple Vitamin (MULTIVITAMIN) tablet Take 1 tablet by mouth daily.   SYMBICORT 80-4.5 MCG/ACT inhaler Inhale 2 puffs by mouth twice daily     Allergies:   Patient has no known allergies.   Social History   Tobacco Use   Smoking status: Never   Smokeless tobacco: Never  Substance Use Topics   Alcohol use: Not Currently    Comment: socially   Drug use: Never     Family History: The patient's family history includes Diabetes in her sister; Heart disease in her mother; Hypertension in her mother and sister; Stroke in her mother.  ROS:   Please see the history of present illness.    (+) Chest pressure/tightness during asthma attack (+) Shortness of breath (+) Heart "fluttering" (+) Wheezing (+) Joint pain All other systems reviewed and are negative.  EKGs/Labs/Other Studies Reviewed:    The following studies  were reviewed today: No prior CV studies available.  EKG: not performed today. 07/31/2020: Sinus rhythm, LBBB, rate 90 bpm, QRS duration of 140 ms  Recent Labs: 01/25/2020: ALT 47; BUN 21; Creatinine, Ser 0.85; Potassium 4.7; Sodium 139  Recent Lipid Panel    Component Value Date/Time   CHOL 149 10/12/2019 0941   TRIG 133 10/12/2019 0941   HDL 46 10/12/2019 0941   CHOLHDL 3.2 10/12/2019 0941   LDLCALC 80 10/12/2019 0941    Physical Exam:    VS:  BP 124/82  (BP Location: Right Arm, Patient Position: Sitting, Cuff Size: Large)   Pulse 82   Ht '5\' 2"'$  (1.575 m)   Wt 218 lb 9.6 oz (99.2 kg)   SpO2 96%   BMI 39.98 kg/m     Wt Readings from Last 5 Encounters:  10/02/20 218 lb 9.6 oz (99.2 kg)  08/20/20 227 lb (103 kg)  07/31/20 227 lb (103 kg)  07/26/20 225 lb (102.1 kg)  04/24/20 226 lb (102.5 kg)    Constitutional: No acute distress Eyes: sclera non-icteric, normal conjunctiva and lids ENMT: normal dentition, moist mucous membranes Cardiovascular: regular rhythm, normal rate, faint systolic murmur at LUSB. S1 and S2 normal. Radial pulses normal bilaterally. No jugular venous distention.  Respiratory: clear to auscultation bilaterally GI : normal bowel sounds, soft and nontender. No distention.   MSK: extremities warm, well perfused. No edema.  NEURO: grossly nonfocal exam, moves all extremities. PSYCH: alert and oriented x 3, normal mood and affect.   ASSESSMENT:    1. Dyspnea on exertion   2. Heart murmur   3. Left bundle branch block   4. Hypertension associated with diabetes ([place])   5. Hyperlipidemia, unspecified hyperlipidemia type   6. Moderate persistent asthma, unspecified whether complicated   7. Incisional hernia, without obstruction or gangrene     PLAN:    Dyspnea on exertion -  Left bundle branch block - - no worrisome findings on nuclear stress test or echocardiogram.  No signs of prior infarct to contribute to left bundle branch block.  I have asked her to increase her activity level as tolerated and if symptoms do not improve or worsen, we will consider alternate testing for evaluation of ischemia.  Exercise recommendations as below.  Hypertension associated with diabetes (HCC)-blood pressure with reasonably good control today, continue amlodipine, losartan HCTZ.  We discussed that it is possible that if blood pressure rises with activity this could contribute to shortness of breath as well and we could consider  additional low-dose diuretic therapy such as furosemide or spironolactone if symptoms continue.  We will follow.  Hyperlipidemia, unspecified hyperlipidemia type-she has diabetes and is on Lipitor 40 mg daily, would continue.  Last LDL is 80, this is quite near her goal.  Heart murmur-faint systolic murmur in the setting of no valvular heart disease.  Suspect flow murmur.  If she is anemic at her next laboratory study check, this is most likely the cause.  Moderate persistent asthma with acute exacerbation-overall stable on albuterol and Symbicort.  Exercise recommendations: Goal of exercising for at least 30 minutes a day, at least 5 times per week.  Please exercise to a moderate exertion.  This means that while exercising it is difficult to speak in full sentences, however you are not so short of breath that you feel you must stop, and not so comfortable that you can carry on a full conversation.  Exertion level should be approximately a 5/10, if 10 is the  most exertion you can perform.  Diet recommendations: Recommend a heart healthy diet such as the Mediterranean diet.  This diet consists of plant based foods, healthy fats, lean meats, olive oil.  It suggests limiting the intake of simple carbohydrates such as white breads, pastries, and pastas.  It also limits the amount of red meat, wine, and dairy products such as cheese that one should consume on a daily basis.  Hernia Preop CV exam - The patient is low-intermediate risk for intermediate risk procedure.  No further cardiovascular testing is required prior to the procedure.  Level of risk felt to be nonmodifiable at this time if this level of risk is acceptable to the patient and surgical team, the patient should be considered optimized from a cardiovascular standpoint.  Patient understands risk categorization and is willing to proceed.  Total time of encounter: 30 minutes total time of encounter, including 25 minutes spent in face-to-face  patient care on the date of this encounter. This time includes coordination of care and counseling regarding above mentioned problem list. Remainder of non-face-to-face time involved reviewing chart documents/testing relevant to the patient encounter and documentation in the medical record. I have independently reviewed documentation from referring provider.   Cherlynn Kaiser, MD, [place]    Shared Decision Making/Informed Consent:     Medication Adjustments/Labs and Tests Ordered: Current medicines are reviewed at length with the patient today.  Concerns regarding medicines are outlined above.   No orders of the defined types were placed in this encounter.   No orders of the defined types were placed in this encounter.   Patient Instructions  Medication Instructions:  No Changes In Medications at this time.  *If you need a refill on your cardiac medications before your next appointment, please call your pharmacy*  Follow-Up: At [facility], you and your health needs are our priority.  As part of our continuing mission to provide you with exceptional heart care, we have created designated Provider Care Teams.  These Care Teams include your primary Cardiologist (physician) and Advanced Practice Providers (APPs -  Physician Assistants and Nurse Practitioners) who all work together to provide you with the care you need, when you need it.  Your next appointment:   7 month(s)  The format for your next appointment:   In Person  Provider:   Cherlynn Kaiser, MD

## 2020-10-02 NOTE — Patient Instructions (Signed)
Medication Instructions:  No Changes In Medications at this time.  *If you need a refill on your cardiac medications before your next appointment, please call your pharmacy*  Follow-Up: At [facility], you and your health needs are our priority.  As part of our continuing mission to provide you with exceptional heart care, we have created designated Provider Care Teams.  These Care Teams include your primary Cardiologist (physician) and Advanced Practice Providers (APPs -  Physician Assistants and Nurse Practitioners) who all work together to provide you with the care you need, when you need it.  Your next appointment:   7 month(s)  The format for your next appointment:   In Person  Provider:   Cherlynn Kaiser, MD

## 2020-10-04 ENCOUNTER — Other Ambulatory Visit: Payer: Self-pay

## 2020-10-04 ENCOUNTER — Ambulatory Visit
Admission: RE | Admit: 2020-10-04 | Discharge: 2020-10-04 | Disposition: A | Discharge status: Home / Self Care | Payer: 59 | Source: Ambulatory Visit | Attending: General Surgery | Admitting: General Surgery

## 2020-10-04 DIAGNOSIS — K430 Incisional hernia with obstruction, without gangrene: Secondary | ICD-10-CM

## 2020-10-10 ENCOUNTER — Ambulatory Visit: Payer: Self-pay | Admitting: General Surgery

## 2020-10-10 NOTE — H&P (Signed)
Chief Complaint: Hernia       History of Present Illness: Patient follows back up today after CT scan.  CT scan was significant for colon containing hernia.  Hernia at the widest area was 7 cm.  Patient states that she continues with some discomfort.   Patient we recently saw her cardiologist and was cleared for surgery.  The note is in epic.     ----------------------------- Catherine Cline is a 65 y.o. female who is seen today as an office consultation at the request of Dr. Pasty Arch for evaluation of Hernia .     Patient is a 65 year old female, who comes in secondary to a recurrent incisional hernia. Patient states that she previously had a ex lap for ovarian mass removal.  She said subsequent to this she has had a open incisional hernia repair with mesh.  She states it was approximately 10 to 15 years ago. Patient states that she has noticed that her recurrent hernia has been increasing in size.  She states she was able to reduce it and has become more difficult to do so. She states that it does bother her.   Patient otherwise has a history of hypertension, diabetes, and is currently seeing a cardiologist and being worked up with stress test and echocardiogram.  She follows back up with them at the end of this month.         Review of Systems: A complete review of systems was obtained from the patient.  I have reviewed this information and discussed as appropriate with the patient.  See HPI as well for other ROS.   Review of Systems  Constitutional: Negative for fever.  HENT: Negative for congestion.   Eyes: Negative for blurred vision.  Respiratory: Negative for cough, shortness of breath and wheezing.   Cardiovascular: Negative for chest pain and palpitations.  Gastrointestinal: Negative for heartburn.  Genitourinary: Negative for dysuria.  Musculoskeletal: Negative for myalgias.  Skin: Negative for rash.  Neurological: Negative for dizziness and headaches.   Psychiatric/Behavioral: Negative for depression and suicidal ideas.  All other systems reviewed and are negative.       Medical History: Past Medical History History reviewed. No pertinent past medical history.     There is no problem list on file for this patient.     Past Surgical History         Past Surgical History: Procedure Laterality Date  HERNIA REPAIR   08/04/2019  JOINT REPLACEMENT Left     knee  OOPHORECTOMY       age 35      Allergies No Known Allergies               Current Outpatient Medications on File Prior to Visit Medication Sig Dispense Refill  albuterol 90 mcg/actuation inhaler Inhale into the lungs      amLODIPine (NORVASC) 10 MG tablet Take 1 tablet by mouth once daily      aspirin 81 MG EC tablet Take by mouth      atorvastatin (LIPITOR) 40 MG tablet Take 40 mg by mouth once daily      budesonide-formoteroL (SYMBICORT) 80-4.5 mcg/actuation inhaler Inhale into the lungs      dulaglutide (TRULICITY) 1.5 99991111 mL subcutaneous injection Inject subcutaneously      losartan-hydrochlorothiazide (HYZAAR) 100-12.5 mg tablet Take 1 tablet by mouth once daily      metFORMIN (GLUCOPHAGE) 1000 MG tablet Take by mouth       No current facility-administered medications on file prior  to visit.     Family History History reviewed. No pertinent family history.     Social History       Tobacco Use Smoking Status Never Smoker Smokeless Tobacco Never Used     Social History Social History          Socioeconomic History  Marital status: Unknown Tobacco Use  Smoking status: Never Smoker  Smokeless tobacco: Never Used [employer], mining Use: Never used Substance and Sexual Activity  Alcohol use: Never  Drug use: Never      Objective:         Vitals:   09/07/20 1037 BP: 124/72 Pulse: (!) 114 Temp: 36.6 C (97.8 F) SpO2: 97% Weight: (!) 100.8 kg (222 lb 3.2 oz) Height: 157.5 cm ('5\' 2"'$ )   Body mass index is 40.64 kg/m.    Physical Exam Constitutional:      Appearance: Normal appearance.  HENT:     Head: Normocephalic and atraumatic.     Mouth/Throat:     Mouth: Mucous membranes are moist.     Pharynx: Oropharynx is clear.  Eyes:     General: No scleral icterus.    Pupils: Pupils are equal, round, and reactive to light.  Cardiovascular:     Rate and Rhythm: Normal rate and regular rhythm.     Pulses: Normal pulses.     Heart sounds: No murmur heard.   No friction rub. No gallop.  Pulmonary:     Effort: Pulmonary effort is normal. No respiratory distress.     Breath sounds: Normal breath sounds. No stridor.  Abdominal:     General: Abdomen is flat.     Hernia: A hernia is present. Hernia is present in the ventral area.    Musculoskeletal:        General: No swelling.  Skin:    General: Skin is warm.  Neurological:     General: No focal deficit present.     Mental Status: She is alert and oriented to person, place, and time. Mental status is at baseline.  Psychiatric:        Mood and Affect: Mood normal.        Thought Content: Thought content normal.        Judgment: Judgment normal.            Assessment and Plan: Diagnoses and all orders for this visit:   Recurrent incisional hernia with incarceration     Catherine Cline is a 64 y.o. female    Patient is a 65 year old female, with history of hypertension, diabetes, and a recurrent incisional hernia. 1.  We will proceed to the operating room for a robotic incisional hernia repair with mesh. 2.  I discussed with her the risks and benefits of the procedure to include but not limited to: Infection, bleeding, damage to surrounding structures, possible recurrence.  Possible need for further surgery.  Patient voiced understanding and wishes to proceed.            Ralene Ok, MD, [facility], [facility]

## 2020-10-13 ENCOUNTER — Other Ambulatory Visit: Payer: Self-pay | Admitting: Emergency Medicine

## 2020-10-13 DIAGNOSIS — J4541 Moderate persistent asthma with (acute) exacerbation: Secondary | ICD-10-CM

## 2020-10-31 ENCOUNTER — Other Ambulatory Visit: Payer: Self-pay

## 2020-10-31 ENCOUNTER — Encounter: Payer: Self-pay | Admitting: Emergency Medicine

## 2020-10-31 ENCOUNTER — Ambulatory Visit (INDEPENDENT_AMBULATORY_CARE_PROVIDER_SITE_OTHER): Payer: 59 | Admitting: Emergency Medicine

## 2020-10-31 VITALS — BP 122/70 | HR 85 | Temp 98.0°F | Ht 62.0 in | Wt 218.0 lb

## 2020-10-31 DIAGNOSIS — E785 Hyperlipidemia, unspecified: Secondary | ICD-10-CM

## 2020-10-31 DIAGNOSIS — E1169 Type 2 diabetes mellitus with other specified complication: Secondary | ICD-10-CM

## 2020-10-31 DIAGNOSIS — M8949 Other hypertrophic osteoarthropathy, multiple sites: Secondary | ICD-10-CM

## 2020-10-31 DIAGNOSIS — Z8709 Personal history of other diseases of the respiratory system: Secondary | ICD-10-CM | POA: Diagnosis not present

## 2020-10-31 DIAGNOSIS — M159 Polyosteoarthritis, unspecified: Secondary | ICD-10-CM

## 2020-10-31 DIAGNOSIS — K432 Incisional hernia without obstruction or gangrene: Secondary | ICD-10-CM

## 2020-10-31 DIAGNOSIS — J4541 Moderate persistent asthma with (acute) exacerbation: Secondary | ICD-10-CM

## 2020-10-31 DIAGNOSIS — I152 Hypertension secondary to endocrine disorders: Secondary | ICD-10-CM

## 2020-10-31 DIAGNOSIS — E1159 Type 2 diabetes mellitus with other circulatory complications: Secondary | ICD-10-CM

## 2020-10-31 DIAGNOSIS — J4540 Moderate persistent asthma, uncomplicated: Secondary | ICD-10-CM

## 2020-10-31 LAB — POCT GLYCOSYLATED HEMOGLOBIN (HGB A1C): Hemoglobin A1C: 6.6 % — AB (ref 4.0–5.6)

## 2020-10-31 MED ORDER — TRELEGY ELLIPTA 100-62.5-25 MCG/INH IN AEPB: 1.0000 | INHALATION_SPRAY | Freq: Every day | RESPIRATORY_TRACT | 11 refills | Status: DC

## 2020-10-31 MED ORDER — UMECLIDINIUM-VILANTEROL 62.5-25 MCG/INH IN AEPB: 1.0000 | INHALATION_SPRAY | Freq: Every day | RESPIRATORY_TRACT | 5 refills | Status: DC

## 2020-10-31 NOTE — Patient Instructions (Signed)
Diabetes Mellitus and Nutrition, Adult When you have diabetes, or diabetes mellitus, it is very important to have healthy eating habits because your blood sugar (glucose) levels are greatly affected by what you eat and drink. Eating healthy foods in the right amounts, at about the same times every day, can help you:  Control your blood glucose.  Lower your risk of heart disease.  Improve your blood pressure.  Reach or maintain a healthy weight. What can affect my meal plan? Every person with diabetes is different, and each person has different needs for a meal plan. Your health care provider may recommend that you work with a dietitian to make a meal plan that is best for you. Your meal plan may vary depending on factors such as:  The calories you need.  The medicines you take.  Your weight.  Your blood glucose, blood pressure, and cholesterol levels.  Your activity level.  Other health conditions you have, such as heart or kidney disease. How do carbohydrates affect me? Carbohydrates, also called carbs, affect your blood glucose level more than any other type of food. Eating carbs naturally raises the amount of glucose in your blood. Carb counting is a method for keeping track of how many carbs you eat. Counting carbs is important to keep your blood glucose at a healthy level, especially if you use insulin or take certain oral diabetes medicines. It is important to know how many carbs you can safely have in each meal. This is different for every person. Your dietitian can help you calculate how many carbs you should have at each meal and for each snack. How does alcohol affect me? Alcohol can cause a sudden decrease in blood glucose (hypoglycemia), especially if you use insulin or take certain oral diabetes medicines. Hypoglycemia can be a life-threatening condition. Symptoms of hypoglycemia, such as sleepiness, dizziness, and confusion, are similar to symptoms of having too much  alcohol.  Do not drink alcohol if: ? Your health care provider tells you not to drink. ? You are pregnant, may be pregnant, or are planning to become pregnant.  If you drink alcohol: ? Do not drink on an empty stomach. ? Limit how much you use to:  0-1 drink a day for women.  0-2 drinks a day for men. ? Be aware of how much alcohol is in your drink. In the [place] one drink equals one 12 oz bottle of beer (355 mL), one 5 oz glass of wine (148 mL), or one 1 oz glass of hard liquor (44 mL). ? Keep yourself hydrated with water, diet soda, or unsweetened iced tea.  Keep in mind that regular soda, juice, and other mixers may contain a lot of sugar and must be counted as carbs. What are tips for following this plan? Reading food labels  Start by checking the serving size on the "Nutrition Facts" label of packaged foods and drinks. The amount of calories, carbs, fats, and other nutrients listed on the label is based on one serving of the item. Many items contain more than one serving per package.  Check the total grams (g) of carbs in one serving. You can calculate the number of servings of carbs in one serving by dividing the total carbs by 15. For example, if a food has 30 g of total carbs per serving, it would be equal to 2 servings of carbs.  Check the number of grams (g) of saturated fats and trans fats in one serving. Choose foods that have   a low amount or none of these fats.  Check the number of milligrams (mg) of salt (sodium) in one serving. Most people should limit total sodium intake to less than 2,300 mg per day.  Always check the nutrition information of foods labeled as "low-fat" or "nonfat." These foods may be higher in added sugar or refined carbs and should be avoided.  Talk to your dietitian to identify your daily goals for nutrients listed on the label. Shopping  Avoid buying canned, pre-made, or processed foods. These foods tend to be high in fat, sodium, and added  sugar.  Shop around the outside edge of the grocery store. This is where you will most often find fresh fruits and vegetables, bulk grains, fresh meats, and fresh dairy. Cooking  Use low-heat cooking methods, such as baking, instead of high-heat cooking methods like deep frying.  Cook using healthy oils, such as olive, canola, or sunflower oil.  Avoid cooking with butter, cream, or high-fat meats. Meal planning  Eat meals and snacks regularly, preferably at the same times every day. Avoid going long periods of time without eating.  Eat foods that are high in fiber, such as fresh fruits, vegetables, beans, and whole grains. Talk with your dietitian about how many servings of carbs you can eat at each meal.  Eat 4-6 oz (112-168 g) of lean protein each day, such as lean meat, chicken, fish, eggs, or tofu. One ounce (oz) of lean protein is equal to: ? 1 oz (28 g) of meat, chicken, or fish. ? 1 egg. ?  cup (62 g) of tofu.  Eat some foods each day that contain healthy fats, such as avocado, nuts, seeds, and fish.   What foods should I eat? Fruits Berries. Apples. Oranges. Peaches. Apricots. Plums. Grapes. Mango. Papaya. Pomegranate. Kiwi. Cherries. Vegetables Lettuce. Spinach. Leafy greens, including kale, chard, collard greens, and mustard greens. Beets. Cauliflower. Cabbage. Broccoli. Carrots. Green beans. Tomatoes. Peppers. Onions. Cucumbers. Brussels sprouts. Grains Whole grains, such as whole-wheat or whole-grain bread, crackers, tortillas, cereal, and pasta. Unsweetened oatmeal. Quinoa. Brown or wild rice. Meats and other proteins Seafood. Poultry without skin. Lean cuts of poultry and beef. Tofu. Nuts. Seeds. Dairy Low-fat or fat-free dairy products such as milk, yogurt, and cheese. The items listed above may not be a complete list of foods and beverages you can eat. Contact a dietitian for more information. What foods should I avoid? Fruits Fruits canned with  syrup. Vegetables Canned vegetables. Frozen vegetables with butter or cream sauce. Grains Refined white flour and flour products such as bread, pasta, snack foods, and cereals. Avoid all processed foods. Meats and other proteins Fatty cuts of meat. Poultry with skin. Breaded or fried meats. Processed meat. Avoid saturated fats. Dairy Full-fat yogurt, cheese, or milk. Beverages Sweetened drinks, such as soda or iced tea. The items listed above may not be a complete list of foods and beverages you should avoid. Contact a dietitian for more information. Questions to ask a health care provider  Do I need to meet with a diabetes educator?  Do I need to meet with a dietitian?  What number can I call if I have questions?  When are the best times to check my blood glucose? Where to find more information:  American Diabetes Association: diabetes.org  Academy of Nutrition and Dietetics: www.eatright.org  National Institute of Diabetes and Digestive and Kidney Diseases: www.niddk.nih.gov  Association of Diabetes Care and Education Specialists: www.diabeteseducator.org Summary  It is important to have healthy eating   habits because your blood sugar (glucose) levels are greatly affected by what you eat and drink.  A healthy meal plan will help you control your blood glucose and maintain a healthy lifestyle.  Your health care provider may recommend that you work with a dietitian to make a meal plan that is best for you.  Keep in mind that carbohydrates (carbs) and alcohol have immediate effects on your blood glucose levels. It is important to count carbs and to use alcohol carefully. This information is not intended to replace advice given to you by your health care provider. Make sure you discuss any questions you have with your health care provider. Document Revised: 12/28/2018 Document Reviewed: 12/28/2018 Elsevier Patient Education  2021 Elsevier Inc.  

## 2020-10-31 NOTE — Assessment & Plan Note (Signed)
Stable.  Continue atorvastatin 40 mg daily.  Diet and nutrition discussed. The 10-year ASCVD risk score (Arnett DK, et al., 2019) is: 10.6%   Values used to calculate the score:     Age: 65 years     Sex: Female     Is Non-Hispanic African American: No     Diabetic: Yes     Tobacco smoker: No     Systolic Blood Pressure: 588 mmHg     Is BP treated: Yes     HDL Cholesterol: 46 mg/dL     Total Cholesterol: 149 mg/dL

## 2020-10-31 NOTE — Assessment & Plan Note (Signed)
Already evaluated by general surgeon.  Scheduled for surgery next month.

## 2020-10-31 NOTE — Progress Notes (Signed)
Assessment and Plan:  Diagnoses and all orders for this visit:  Recurrent incisional hernia with incarceration   Catherine Cline is a 65 y.o. female   Patient is a 65 year old female, with history of hypertension, diabetes, and a recurrent incisional hernia. 1. We will proceed with a CT scan to evaluate the hernia size. 2. Patient to follow-up with a cardiologist prior to her next clinic visit. 3 POI the patient follow back up in 1 month to discuss the CT scan and plan surgery at that time.  Return in about 1 month (around 10/08/2020).  Ralene Ok, MD, [facility] General & Minimally Invasive Surgery Assessment and Plan:  Diagnoses and all orders for this visit:   Recurrent incisional hernia with incarceration    Catherine Cline is a 65 y.o. female    Patient is a 65 year old female, with history of hypertension, diabetes, and a recurrent incisional hernia. 1. We will proceed to the operating room for a robotic incisional hernia repair with mesh. 2. I discussed with her the risks and benefits of the procedure to include but not limited to: Infection, bleeding, damage to surrounding structures, possible recurrence. Possible need for further surgery. Patient voiced understanding and wishes to proceed.           Return in about 1 month (around 10/08/2020).   Ralene Ok, MD, [facility] General & Minimally Invasive Surgery  Cardiology evaluation, assessment and plan as follows:  ASSESSMENT:     1. Dyspnea on exertion   2. Heart murmur   3. Left bundle branch block   4. Hypertension associated with diabetes ([place])   5. Hyperlipidemia, unspecified hyperlipidemia type   6. Moderate persistent asthma, unspecified whether complicated   7. Incisional hernia, without obstruction or gangrene       PLAN:     Dyspnea on exertion -  Left bundle branch block - - no worrisome findings on nuclear stress test or echocardiogram.  No  signs of prior infarct to contribute to left bundle branch block.  I have asked her to increase her activity level as tolerated and if symptoms do not improve or worsen, we will consider alternate testing for evaluation of ischemia.  Exercise recommendations as below.   Hypertension associated with diabetes (HCC)-blood pressure with reasonably good control today, continue amlodipine, losartan HCTZ.  We discussed that it is possible that if blood pressure rises with activity this could contribute to shortness of breath as well and we could consider additional low-dose diuretic therapy such as furosemide or spironolactone if symptoms continue.  We will follow.   Hyperlipidemia, unspecified hyperlipidemia type-she has diabetes and is on Lipitor 40 mg daily, would continue.  Last LDL is 80, this is quite near her goal.   Heart murmur-faint systolic murmur in the setting of no valvular heart disease.  Suspect flow murmur.  If she is anemic at her next laboratory study check, this is most likely the cause.   Moderate persistent asthma with acute exacerbation-overall stable on albuterol and Symbicort.   Exercise recommendations: Goal of exercising for at least 30 minutes a day, at least 5 times per week.  Please exercise to a moderate exertion.  This means that while exercising it is difficult to speak in full sentences, however you are not so short of breath that you feel you must stop, and not so comfortable that you can carry on a full conversation.  Exertion level should be approximately a 5/10, if 10 is  the most exertion you can perform.   Diet recommendations: Recommend a heart healthy diet such as the Mediterranean diet.  This diet consists of plant based foods, healthy fats, lean meats, olive oil.  It suggests limiting the intake of simple carbohydrates such as white breads, pastries, and pastas.  It also limits the amount of red meat, wine, and dairy products such as cheese that one should consume on  a daily basis.   Hernia Preop CV exam - The patient is low-intermediate risk for intermediate risk procedure.  No further cardiovascular testing is required prior to the procedure.  Level of risk felt to be nonmodifiable at this time if this level of risk is acceptable to the patient and surgical team, the patient should be considered optimized from a cardiovascular standpoint.  Patient understands risk categorization and is willing to proceed.    Catherine Cline 65 y.o.   Chief Complaint  Patient presents with   Hypertension    Diabetic and BP follow up    HISTORY OF PRESENT ILLNESS: This is a 65 y.o. female with history of diabetes, dyslipidemia, and hypertension here for follow-up. 10-month follow-up.  Since she saw general surgeon for incisional hernia.  Had CT scan of abdomen and pelvis.  Scheduled for surgery next month.  Also saw cardiologist for heart murmur.  Had normal echo and nuclear stress test.  No concerns. Blood pressure has been very well controlled with normal pressure readings at home. Diabetes doing well with morning sugars in the low 100s. History of asthma.  Off Symbicort for 3 days.  Had to use albuterol nebulizer last night.  Still having some issues with dyspnea on exertion. Otherwise doing well.  No other complaints or medical concerns. BP Readings from Last 3 Encounters:  10/02/20 124/82  07/31/20 138/74  07/26/20 138/78     Hypertension Associated symptoms include shortness of breath (Dyspnea on exertion). Pertinent negatives include no chest pain, headaches, neck pain or palpitations.    Prior to Admission medications   Medication Sig Start Date End Date Taking? Authorizing Provider  albuterol (VENTOLIN HFA) 108 (90 Base) MCG/ACT inhaler Inhale 2 puffs into the lungs every 6 (six) hours as needed for wheezing or shortness of breath. 07/26/20  Yes Ramaj Frangos, Ines Bloomer, MD  amLODipine (NORVASC) 10 MG tablet Take 1 tablet (10 mg total) by mouth daily.  07/26/20  Yes Horald Pollen, MD  aspirin EC 81 MG tablet Take 81 mg by mouth in the morning and at bedtime.    Yes [provider]  atorvastatin (LIPITOR) 40 MG tablet Take 1 tablet (40 mg total) by mouth daily. 01/25/20  Yes Jackeline Gutknecht, Ines Bloomer, MD  Cholecalciferol (VITAMIN D3) 5000 units CAPS Take by mouth daily.   Yes [provider]  KRILL OIL PO Take by mouth daily.   Yes [provider]  Levocetirizine Dihydrochloride (XYZAL ALLERGY 24HR PO) Take by mouth.   Yes [provider]  losartan-hydrochlorothiazide (HYZAAR) 100-12.5 MG tablet Take 1 tablet by mouth daily. 08/13/20  Yes Horald Pollen, MD  metFORMIN (GLUCOPHAGE) 1000 MG tablet Take 1 tablet (1,000 mg total) by mouth 2 (two) times daily with a meal. 01/25/20  Yes Sheron Robin, Ines Bloomer, MD  Multiple Vitamin (MULTIVITAMIN) tablet Take 1 tablet by mouth daily.   Yes [provider]  SYMBICORT 80-4.5 MCG/ACT inhaler Inhale 2 puffs by mouth twice daily 10/13/20  Yes Quetzalli Clos, Ines Bloomer, MD  TRULICITY 1.5 QQ/2.2LN SOPN INJECT  1.5 MG INTO THE  SKIN ONCE A WEEK 10/13/20  Yes Horald Pollen, MD    No Known Allergies  Patient Active Problem List   Diagnosis Date Noted   Incisional hernia, without obstruction or gangrene 07/26/2020   Heart murmur 07/26/2020   Body mass index (BMI) of 40.1-44.9 in adult ([place]) 10/12/2019   Moderate persistent asthma with acute exacerbation 07/13/2019   Primary osteoarthritis involving multiple joints 07/14/2018   Chronic joint pain 07/14/2018   Dyslipidemia associated with type 2 diabetes mellitus ([place]) 01/14/2018   Hypertension associated with diabetes ([place]) 07/07/2017   Hyperlipidemia 07/07/2017    Past Medical History:  Diagnosis Date   Arthritis    Asthma    Hypertension     Past Surgical History:  Procedure Laterality Date   OVARIAN CYST REMOVAL      Social History   Socioeconomic History   Marital status: Single     Spouse name: Not on file   Number of children: Not on file   Years of education: Not on file   Highest education level: Not on file  Occupational History   Not on file  Tobacco Use   Smoking status: Never   Smokeless tobacco: Never  Substance and Sexual Activity   Alcohol use: Not Currently    Comment: socially   Drug use: Never   Sexual activity: Not on file  Other Topics Concern   Not on file  Social History Narrative   Not on file   Social Determinants of Health   Financial Resource Strain: Not on file  Food Insecurity: Not on file  Transportation Needs: Not on file  Physical Activity: Not on file  Stress: Not on file  Social Connections: Not on file  Intimate Partner Violence: Not on file    Family History  Problem Relation Age of Onset   Heart disease Mother    Hypertension Mother    Stroke Mother    Diabetes Sister    Hypertension Sister      Review of Systems  Constitutional: Negative.  Negative for chills and fever.  HENT: Negative.  Negative for congestion and sore throat.   Respiratory:  Positive for shortness of breath (Dyspnea on exertion). Negative for cough.   Cardiovascular: Negative.  Negative for chest pain and palpitations.  Gastrointestinal: Negative.  Negative for abdominal pain, diarrhea, nausea and vomiting.  Genitourinary: Negative.  Negative for dysuria and hematuria.  Musculoskeletal: Negative.  Negative for back pain, myalgias and neck pain.  Skin: Negative.  Negative for rash.  Neurological: Negative.  Negative for dizziness and headaches.  All other systems reviewed and are negative.  Today's Vitals   10/31/20 0759  Weight: 218 lb (98.9 kg)  Height: 5\' 2"  (1.575 m)   Body mass index is 39.87 kg/m. Wt Readings from Last 3 Encounters:  10/31/20 218 lb (98.9 kg)  10/02/20 218 lb 9.6 oz (99.2 kg)  08/20/20 227 lb (103 kg)    Physical Exam Vitals reviewed.  Constitutional:      Appearance: Normal appearance.  HENT:     Head:  Normocephalic.  Eyes:     Extraocular Movements: Extraocular movements intact.     Pupils: Pupils are equal, round, and reactive to light.  Cardiovascular:     Rate and Rhythm: Normal rate and regular rhythm.     Pulses: Normal pulses.     Heart sounds: Normal heart sounds.  Pulmonary:     Effort: Pulmonary effort is normal.     Breath sounds: Normal breath sounds.  Musculoskeletal:        General: Normal range of motion.     Cervical back: No tenderness.     Right lower leg: No edema.     Left lower leg: No edema.  Lymphadenopathy:     Cervical: No cervical adenopathy.  Skin:    General: Skin is warm and dry.     Capillary Refill: Capillary refill takes less than 2 seconds.  Neurological:     General: No focal deficit present.     Mental Status: She is alert and oriented to person, place, and time.  Psychiatric:        Mood and Affect: Mood normal.        Behavior: Behavior normal.     ASSESSMENT & PLAN: Saranya was seen today for hypertension.  Diagnoses and all orders for this visit:  Hypertension associated with diabetes ([place]) -     POCT glycosylated hemoglobin (Hb A1C)  Dyslipidemia associated with type 2 diabetes mellitus (HCC)  Primary osteoarthritis involving multiple joints  History of asthma  Moderate persistent asthma without complication -     Discontinue: umeclidinium-vilanterol (ANORO ELLIPTA) 62.5-25 MCG/INH AEPB; Inhale 1 puff into the lungs daily at 6 (six) AM.  Moderate persistent asthma with acute exacerbation -     Fluticasone-Umeclidin-Vilant (TRELEGY ELLIPTA) 100-62.5-25 MCG/INH AEPB; Inhale 1 puff into the lungs daily.  Incisional hernia, without obstruction or gangrene  Hypertension associated with diabetes ([place]) Well-controlled hypertension.  Continue amlodipine 10 mg daily and Hyzaar 100-12.5 mg daily. Dietary approaches to stop hypertension discussed. BP Readings from Last 3 Encounters:  10/31/20 122/70  10/02/20 124/82  07/31/20  138/74  Well-controlled diabetes with hemoglobin A1c of 6.6.  Continue weekly Trulicity at 1.5 mg and metformin 1000 mg twice a day.  Diet and nutrition discussed. Lab Results  Component Value Date   HGBA1C 6.6 (A) 10/31/2020      Moderate persistent asthma with acute exacerbation Asthma symptoms still persistent.  Most likely has COPD component.  Chronic condition still not very well controlled. Has been off Symbicort for 3 days.  Used albuterol nebulizer last night. Need to change to LAMA-LABA-corticosteroid combo inhaler.  Will start on Trelegy inhaler depending on insurance coverage. May need pulmonary referral.  Dyslipidemia associated with type 2 diabetes mellitus (HCC) Stable.  Continue atorvastatin 40 mg daily.  Diet and nutrition discussed. The 10-year ASCVD risk score (Arnett DK, et al., 2019) is: 10.6%   Values used to calculate the score:     Age: 72 years     Sex: Female     Is Non-Hispanic African American: No     Diabetic: Yes     Tobacco smoker: No     Systolic Blood Pressure: 992 mmHg     Is BP treated: Yes     HDL Cholesterol: 46 mg/dL     Total Cholesterol: 149 mg/dL   Primary osteoarthritis involving multiple joints Stable.  Not affecting quality of life at present time.  It has improved.  Incisional hernia, without obstruction or gangrene Already evaluated by general surgeon.  Scheduled for surgery next month.  Patient Instructions  Diabetes Mellitus and Nutrition, Adult When you have diabetes, or diabetes mellitus, it is very important to have healthy eating habits because your blood sugar (glucose) levels are greatly affected by what you eat and drink. Eating healthy foods in the right amounts, at about the same times every day, can help you: Control your blood glucose. Lower your risk of heart disease. Improve  your blood pressure. Reach or maintain a healthy weight. What can affect my meal plan? Every person with diabetes is different, and each  person has different needs for a meal plan. Your health care provider may recommend that you work with a dietitian to make a meal plan that is best for you. Your meal plan may vary depending on factors such as: The calories you need. The medicines you take. Your weight. Your blood glucose, blood pressure, and cholesterol levels. Your activity level. Other health conditions you have, such as heart or kidney disease. How do carbohydrates affect me? Carbohydrates, also called carbs, affect your blood glucose level more than any other type of food. Eating carbs naturally raises the amount of glucose in your blood. Carb counting is a method for keeping track of how many carbs you eat. Counting carbs is important to keep your blood glucose at a healthy level, especially if you use insulin or take certain oral diabetes medicines. It is important to know how many carbs you can safely have in each meal. This is different for every person. Your dietitian can help you calculate how many carbs you should have at each meal and for each snack. How does alcohol affect me? Alcohol can cause a sudden decrease in blood glucose (hypoglycemia), especially if you use insulin or take certain oral diabetes medicines. Hypoglycemia can be a life-threatening condition. Symptoms of hypoglycemia, such as sleepiness, dizziness, and confusion, are similar to symptoms of having too much alcohol. Do not drink alcohol if: Your health care provider tells you not to drink. You are pregnant, may be pregnant, or are planning to become pregnant. If you drink alcohol: Do not drink on an empty stomach. Limit how much you use to: 0-1 drink a day for women. 0-2 drinks a day for men. Be aware of how much alcohol is in your drink. In the [place] one drink equals one 12 oz bottle of beer (355 mL), one 5 oz glass of wine (148 mL), or one 1 oz glass of hard liquor (44 mL). Keep yourself hydrated with water, diet soda, or unsweetened iced  tea. Keep in mind that regular soda, juice, and other mixers may contain a lot of sugar and must be counted as carbs. What are tips for following this plan? Reading food labels Start by checking the serving size on the "Nutrition Facts" label of packaged foods and drinks. The amount of calories, carbs, fats, and other nutrients listed on the label is based on one serving of the item. Many items contain more than one serving per package. Check the total grams (g) of carbs in one serving. You can calculate the number of servings of carbs in one serving by dividing the total carbs by 15. For example, if a food has 30 g of total carbs per serving, it would be equal to 2 servings of carbs. Check the number of grams (g) of saturated fats and trans fats in one serving. Choose foods that have a low amount or none of these fats. Check the number of milligrams (mg) of salt (sodium) in one serving. Most people should limit total sodium intake to less than 2,300 mg per day. Always check the nutrition information of foods labeled as "low-fat" or "nonfat." These foods may be higher in added sugar or refined carbs and should be avoided. Talk to your dietitian to identify your daily goals for nutrients listed on the label. Shopping Avoid buying canned, pre-made, or processed foods. These foods tend  to be high in fat, sodium, and added sugar. Shop around the outside edge of the grocery store. This is where you will most often find fresh fruits and vegetables, bulk grains, fresh meats, and fresh dairy. Cooking Use low-heat cooking methods, such as baking, instead of high-heat cooking methods like deep frying. Cook using healthy oils, such as olive, canola, or sunflower oil. Avoid cooking with butter, cream, or high-fat meats. Meal planning Eat meals and snacks regularly, preferably at the same times every day. Avoid going long periods of time without eating. Eat foods that are high in fiber, such as fresh fruits,  vegetables, beans, and whole grains. Talk with your dietitian about how many servings of carbs you can eat at each meal. Eat 4-6 oz (112-168 g) of lean protein each day, such as lean meat, chicken, fish, eggs, or tofu. One ounce (oz) of lean protein is equal to: 1 oz (28 g) of meat, chicken, or fish. 1 egg.  cup (62 g) of tofu. Eat some foods each day that contain healthy fats, such as avocado, nuts, seeds, and fish. What foods should I eat? Fruits Berries. Apples. Oranges. Peaches. Apricots. Plums. Grapes. Mango. Papaya. Pomegranate. Kiwi. Cherries. Vegetables Lettuce. Spinach. Leafy greens, including kale, chard, collard greens, and mustard greens. Beets. Cauliflower. Cabbage. Broccoli. Carrots. Green beans. Tomatoes. Peppers. Onions. Cucumbers. Brussels sprouts. Grains Whole grains, such as whole-wheat or whole-grain bread, crackers, tortillas, cereal, and pasta. Unsweetened oatmeal. Quinoa. Brown or wild rice. Meats and other proteins Seafood. Poultry without skin. Lean cuts of poultry and beef. Tofu. Nuts. Seeds. Dairy Low-fat or fat-free dairy products such as milk, yogurt, and cheese. The items listed above may not be a complete list of foods and beverages you can eat. Contact a dietitian for more information. What foods should I avoid? Fruits Fruits canned with syrup. Vegetables Canned vegetables. Frozen vegetables with butter or cream sauce. Grains Refined white flour and flour products such as bread, pasta, snack foods, and cereals. Avoid all processed foods. Meats and other proteins Fatty cuts of meat. Poultry with skin. Breaded or fried meats. Processed meat. Avoid saturated fats. Dairy Full-fat yogurt, cheese, or milk. Beverages Sweetened drinks, such as soda or iced tea. The items listed above may not be a complete list of foods and beverages you should avoid. Contact a dietitian for more information. Questions to ask a health care provider Do I need to meet with a  diabetes educator? Do I need to meet with a dietitian? What number can I call if I have questions? When are the best times to check my blood glucose? Where to find more information: American Diabetes Association: diabetes.org Academy of Nutrition and Dietetics: www.eatright.[employer] of Diabetes and Digestive and Kidney Diseases: [employer] Association of Diabetes Care and Education Specialists: www.diabeteseducator.org Summary It is important to have healthy eating habits because your blood sugar (glucose) levels are greatly affected by what you eat and drink. A healthy meal plan will help you control your blood glucose and maintain a healthy lifestyle. Your health care provider may recommend that you work with a dietitian to make a meal plan that is best for you. Keep in mind that carbohydrates (carbs) and alcohol have immediate effects on your blood glucose levels. It is important to count carbs and to use alcohol carefully. This information is not intended to replace advice given to you by your health care provider. Make sure you discuss any questions you have with your health care provider. Document Revised: 12/28/2018  Document Reviewed: 12/28/2018 Elsevier Patient Education  2021 [place], MD [facility]

## 2020-10-31 NOTE — Assessment & Plan Note (Signed)
Stable.  Not affecting quality of life at present time.  It has improved.

## 2020-10-31 NOTE — Assessment & Plan Note (Signed)
Well-controlled hypertension.  Continue amlodipine 10 mg daily and Hyzaar 100-12.5 mg daily. Dietary approaches to stop hypertension discussed. BP Readings from Last 3 Encounters:  10/31/20 122/70  10/02/20 124/82  07/31/20 138/74  Well-controlled diabetes with hemoglobin A1c of 6.6.  Continue weekly Trulicity at 1.5 mg and metformin 1000 mg twice a day.  Diet and nutrition discussed. Lab Results  Component Value Date   HGBA1C 6.6 (A) 10/31/2020

## 2020-10-31 NOTE — Assessment & Plan Note (Addendum)
Asthma symptoms still persistent.  Most likely has COPD component.  Chronic condition still not very well controlled. Has been off Symbicort for 3 days.  Used albuterol nebulizer last night. Need to change to LAMA-LABA-corticosteroid combo inhaler.  Will start on Trelegy inhaler depending on insurance coverage. May need pulmonary referral.

## 2020-11-07 ENCOUNTER — Other Ambulatory Visit: Payer: Self-pay

## 2020-11-07 ENCOUNTER — Ambulatory Visit
Admission: EM | Admit: 2020-11-07 | Discharge: 2020-11-07 | Disposition: A | Discharge status: Home / Self Care | Payer: 59 | Attending: Emergency Medicine | Admitting: Emergency Medicine

## 2020-11-07 DIAGNOSIS — R053 Chronic cough: Secondary | ICD-10-CM

## 2020-11-07 DIAGNOSIS — J4521 Mild intermittent asthma with (acute) exacerbation: Secondary | ICD-10-CM | POA: Diagnosis not present

## 2020-11-07 DIAGNOSIS — R0602 Shortness of breath: Secondary | ICD-10-CM | POA: Diagnosis not present

## 2020-11-07 MED ORDER — PREDNISONE 10 MG (21) PO TBPK: ORAL_TABLET | Freq: Every day | ORAL | 0 refills | Status: DC

## 2020-11-07 MED ORDER — METHYLPREDNISOLONE SODIUM SUCC 125 MG IJ SOLR
125.0000 mg | Freq: Once | INTRAMUSCULAR | Status: AC
  Administered 2020-11-07: 125 mg via INTRAMUSCULAR

## 2020-11-07 NOTE — ED Provider Notes (Signed)
[facility]    CSN: [id number] Arrival date & time: 11/07/20  0849      History   Chief Complaint Chief Complaint  Patient presents with   Shortness of Breath    HPI Catherine Cline is a 65 y.o. female.   Pt has a hx of asthma and possible copd. Seen her pcp 3 days ago and they started pt on advair. Pt has some sob and wheezing today. She thought it was getting better but last night getting worse. Pt was using albuterol and neb tx twice a day. Had a cardiac work up by pcp and was negative. Pt called her pcp today and they could see her this afternoon.  Denies any fever, no cough, no congestion, no chest pain.    Past Medical History:  Diagnosis Date   Arthritis    Asthma    Hypertension     Patient Active Problem List   Diagnosis Date Noted   Incisional hernia, without obstruction or gangrene 07/26/2020   Heart murmur 07/26/2020   Body mass index (BMI) of 40.1-44.9 in adult ([place]) 10/12/2019   Moderate persistent asthma with acute exacerbation 07/13/2019   Primary osteoarthritis involving multiple joints 07/14/2018   Chronic joint pain 07/14/2018   Dyslipidemia associated with type 2 diabetes mellitus ([place]) 01/14/2018   Hypertension associated with diabetes ([place]) 07/07/2017   Hyperlipidemia 07/07/2017    Past Surgical History:  Procedure Laterality Date   OVARIAN CYST REMOVAL      OB History   No obstetric history on file.      Home Medications    Prior to Admission medications   Medication Sig Start Date End Date Taking? Authorizing Provider  predniSONE (STERAPRED UNI-PAK 21 TAB) 10 MG (21) TBPK tablet Take by mouth daily. Take 6 tabs by mouth daily  for 2 days, then 5 tabs for 2 days, then 4 tabs for 2 days, then 3 tabs for 2 days, 2 tabs for 2 days, then 1 tab by mouth daily for 2 days 11/07/20  Yes Marney Setting, NP  albuterol (VENTOLIN HFA) 108 (90 Base) MCG/ACT inhaler Inhale 2 puffs into the lungs every 6 (six) hours as needed for wheezing  or shortness of breath. 07/26/20   Horald Pollen, MD  amLODipine (NORVASC) 10 MG tablet Take 1 tablet (10 mg total) by mouth daily. 07/26/20   Horald Pollen, MD  aspirin EC 81 MG tablet Take 81 mg by mouth in the morning and at bedtime.     [provider]  atorvastatin (LIPITOR) 40 MG tablet Take 1 tablet (40 mg total) by mouth daily. 01/25/20   Horald Pollen, MD  Cholecalciferol (VITAMIN D3) 5000 units CAPS Take by mouth daily.    [provider]  Fluticasone-Umeclidin-Vilant (TRELEGY ELLIPTA) 100-62.5-25 MCG/INH AEPB Inhale 1 puff into the lungs daily. 10/31/20   Horald Pollen, MD  KRILL OIL PO Take by mouth daily.    [provider]  Levocetirizine Dihydrochloride (XYZAL ALLERGY 24HR PO) Take by mouth.    [provider]  losartan-hydrochlorothiazide (HYZAAR) 100-12.5 MG tablet Take 1 tablet by mouth daily. 08/13/20   Horald Pollen, MD  metFORMIN (GLUCOPHAGE) 1000 MG tablet Take 1 tablet (1,000 mg total) by mouth 2 (two) times daily with a meal. 01/25/20   Sagardia, Ines Bloomer, MD  Multiple Vitamin (MULTIVITAMIN) tablet Take 1 tablet by mouth daily.    [provider]  TRULICITY 1.5 DV/7.6HY SOPN INJECT  1.5 MG INTO  THE SKIN ONCE A WEEK 10/13/20   Horald Pollen, MD    Family History Family History  Problem Relation Age of Onset   Heart disease Mother    Hypertension Mother    Stroke Mother    Diabetes Sister    Hypertension Sister     Social History Social History   Tobacco Use   Smoking status: Never   Smokeless tobacco: Never  Substance Use Topics   Alcohol use: Not Currently    Comment: socially   Drug use: Never     Allergies   Patient has no known allergies.   Review of Systems Review of Systems  Constitutional:  Negative for chills, fatigue and fever.  HENT:  Negative for congestion, postnasal drip, rhinorrhea, sinus pressure, sinus pain, sneezing and sore throat.    Respiratory:  Positive for cough, shortness of breath and wheezing.   Cardiovascular: Negative.   Gastrointestinal: Negative.   Genitourinary: Negative.   Musculoskeletal: Negative.   Skin: Negative.   Neurological: Negative.     Physical Exam Triage Vital Signs ED Triage Vitals  Enc Vitals Group     BP 11/07/20 0911 (!) 151/90     Pulse Rate 11/07/20 0911 87     Resp 11/07/20 0911 20     Temp 11/07/20 0911 98.2 F (36.8 C)     Temp Source 11/07/20 0911 Oral     SpO2 11/07/20 0911 95 %     Weight --      Height --      Head Circumference --      Peak Flow --      Pain Score 11/07/20 0909 0     Pain Loc --      Pain Edu? --      Excl. in [place]? --    No data found.  Updated Vital Signs BP (!) 151/90 (BP Location: Left Arm)   Pulse 87   Temp 98.2 F (36.8 C) (Oral)   Resp 20   SpO2 95%   Visual Acuity Right Eye Distance:   Left Eye Distance:   Bilateral Distance:    Right Eye Near:   Left Eye Near:    Bilateral Near:     Physical Exam Constitutional:      General: She is not in acute distress.    Appearance: She is obese.  HENT:     Mouth/Throat:     Mouth: Mucous membranes are moist.  Eyes:     Pupils: Pupils are equal, round, and reactive to light.  Cardiovascular:     Rate and Rhythm: Normal rate.  Pulmonary:     Effort: Pulmonary effort is normal.     Breath sounds: Examination of the right-upper field reveals wheezing. Examination of the left-upper field reveals wheezing and rhonchi. Wheezing and rhonchi present. No decreased breath sounds.  Chest:     Chest wall: No deformity or tenderness.  Abdominal:     Palpations: Abdomen is soft.  Musculoskeletal:        General: Normal range of motion.  Skin:    General: Skin is warm.  Neurological:     General: No focal deficit present.     Mental Status: She is alert.     UC Treatments / Results  Labs (all labs ordered are listed, but only abnormal results are displayed) Labs Reviewed - No data  to display  EKG   Radiology No results found.  Procedures Procedures (including critical care time)  Medications Ordered in  UC Medications  methylPREDNISolone sodium succinate (SOLU-MEDROL) 125 mg/2 mL injection 125 mg (has no administration in time range)    Initial Impression / Assessment and Plan / UC Course  I have reviewed the triage vital signs and the nursing notes.  Pertinent labs & imaging results that were available during my care of the patient were reviewed by me and considered in my medical decision making (see chart for details).     Call your pcp today and follow up keep the 4 oclock appoint  If symptoms become worse go to er  Cont taking the medications you are prescribed  Final Clinical Impressions(s) / UC Diagnoses   Final diagnoses:  Shortness of breath  Chronic cough  Mild intermittent asthma with acute exacerbation     Discharge Instructions      If symptoms become worse go to er  Cont taking the medications you are prescribed Will give steroids today let your doctor know what we have started      ED Prescriptions     Medication Sig Dispense Auth. Provider   predniSONE (STERAPRED UNI-PAK 21 TAB) 10 MG (21) TBPK tablet Take by mouth daily. Take 6 tabs by mouth daily  for 2 days, then 5 tabs for 2 days, then 4 tabs for 2 days, then 3 tabs for 2 days, 2 tabs for 2 days, then 1 tab by mouth daily for 2 days 42 tablet Marney Setting, NP      PDMP not reviewed this encounter.   Marney Setting, NP 11/07/20 0930

## 2020-11-07 NOTE — ED Triage Notes (Addendum)
Pt reports having asthma flare up in the middle orf the night. Patient states her chest is tight from the SOB.   Patient reports having to sit up all night, she used her inhaler (last used at 0400- 2 puffs), albuterol neb tx (1 treatment around 0430). Patient states that neither one helped.   Patient reports PCP believe she is developing COPD.   Patient is currently wheezing and has a cough. She is A&O x 4.

## 2020-11-07 NOTE — Discharge Instructions (Addendum)
If symptoms become worse go to er  Cont taking the medications you are prescribed Will give steroids today let your doctor know what we have started

## 2020-11-12 ENCOUNTER — Encounter: Payer: Self-pay | Admitting: Emergency Medicine

## 2020-11-21 ENCOUNTER — Ambulatory Visit: Payer: 59 | Admitting: Emergency Medicine

## 2020-11-29 ENCOUNTER — Other Ambulatory Visit: Payer: Self-pay | Admitting: Emergency Medicine

## 2020-11-29 ENCOUNTER — Telehealth: Payer: Self-pay | Admitting: Emergency Medicine

## 2020-11-29 NOTE — Telephone Encounter (Signed)
1.Medication Requested: TRULICITY 1.5 AT/5.5DD SOPN  2. Pharmacy (Name, Street, [place]): [facility], [address]  3. On Med List: yes  4. Last Visit with PCP: 11-21-2020  5. Next visit date with PCP: 05-01-2020   Agent: Please be advised that RX refills may take up to 3 business days. We ask that you follow-up with your pharmacy.

## 2020-11-30 NOTE — Telephone Encounter (Signed)
Medication refilled 11/29/20.

## 2020-12-14 ENCOUNTER — Other Ambulatory Visit: Payer: Self-pay | Admitting: Emergency Medicine

## 2021-01-11 ENCOUNTER — Other Ambulatory Visit: Payer: Self-pay | Admitting: Emergency Medicine

## 2021-01-11 DIAGNOSIS — E785 Hyperlipidemia, unspecified: Secondary | ICD-10-CM

## 2021-01-11 DIAGNOSIS — E1159 Type 2 diabetes mellitus with other circulatory complications: Secondary | ICD-10-CM

## 2021-01-23 ENCOUNTER — Other Ambulatory Visit: Payer: Self-pay | Admitting: Emergency Medicine

## 2021-02-01 ENCOUNTER — Other Ambulatory Visit: Payer: Self-pay

## 2021-02-01 DIAGNOSIS — E785 Hyperlipidemia, unspecified: Secondary | ICD-10-CM

## 2021-02-01 MED ORDER — ATORVASTATIN CALCIUM 40 MG PO TABS: 40.0000 mg | ORAL_TABLET | Freq: Every day | ORAL | 0 refills | Status: DC

## 2021-02-01 NOTE — Telephone Encounter (Signed)
Received a fax from [facility] requesting 90 day refill. Refilled medication.

## 2021-02-04 ENCOUNTER — Other Ambulatory Visit: Payer: Self-pay

## 2021-02-04 ENCOUNTER — Encounter: Payer: Self-pay | Admitting: Emergency Medicine

## 2021-02-04 ENCOUNTER — Ambulatory Visit
Admission: EM | Admit: 2021-02-04 | Discharge: 2021-02-04 | Disposition: A | Discharge status: Home / Self Care | Payer: Managed Care, Other (non HMO) | Attending: Physician Assistant | Admitting: Physician Assistant

## 2021-02-04 DIAGNOSIS — U071 COVID-19: Secondary | ICD-10-CM

## 2021-02-04 DIAGNOSIS — J4541 Moderate persistent asthma with (acute) exacerbation: Secondary | ICD-10-CM

## 2021-02-04 MED ORDER — DOXYCYCLINE HYCLATE 100 MG PO CAPS: 100.0000 mg | ORAL_CAPSULE | Freq: Two times a day (BID) | ORAL | 0 refills | Status: DC

## 2021-02-04 MED ORDER — NIRMATRELVIR/RITONAVIR (PAXLOVID)TABLET: 3.0000 | ORAL_TABLET | Freq: Two times a day (BID) | ORAL | 0 refills | Status: AC

## 2021-02-04 NOTE — ED Triage Notes (Signed)
Has been caring for her mother who was covid +. Now patient is having yellow/green mucus and tested positive for covid this morning. Hx of asthma. Says she feels like she did when she had pneumonia

## 2021-02-04 NOTE — ED Provider Notes (Signed)
[facility]    CSN: [id number] Arrival date & time: 02/04/21  0945      History   Chief Complaint Chief Complaint  Patient presents with   Covid +    HPI Catherine Cline is a 66 y.o. female.   Patient here today for evaluation of covid 19. She reports she had positive test at home this morning. Her mother who lives with her tested positive for covid about a week ago. Patient is concerned because she has asthma and has started to have a more productive cough and mild wheezing and states symptoms feel similar to that she has had in the past with pneumonia.   The history is provided by the patient.   Past Medical History:  Diagnosis Date   Arthritis    Asthma    Hypertension     Patient Active Problem List   Diagnosis Date Noted   Incisional hernia, without obstruction or gangrene 07/26/2020   Heart murmur 07/26/2020   Body mass index (BMI) of 40.1-44.9 in adult ([place]) 10/12/2019   Moderate persistent asthma with acute exacerbation 07/13/2019   Primary osteoarthritis involving multiple joints 07/14/2018   Chronic joint pain 07/14/2018   Dyslipidemia associated with type 2 diabetes mellitus ([place]) 01/14/2018   Hypertension associated with diabetes ([place]) 07/07/2017   Hyperlipidemia 07/07/2017    Past Surgical History:  Procedure Laterality Date   OVARIAN CYST REMOVAL      OB History   No obstetric history on file.      Home Medications    Prior to Admission medications   Medication Sig Start Date End Date Taking? Authorizing Provider  doxycycline (VIBRAMYCIN) 100 MG capsule Take 1 capsule (100 mg total) by mouth 2 (two) times daily. 02/04/21  Yes Francene Finders, PA-C  nirmatrelvir/ritonavir EUA (PAXLOVID) 20 x 150 MG & 10 x 100MG  TABS Take 3 tablets by mouth 2 (two) times daily for 5 days. Take nirmatrelvir (150 mg) two tablets twice daily for 5 days and ritonavir (100 mg) one tablet twice daily for 5 days. 02/04/21 02/09/21 Yes Francene Finders, PA-C   albuterol (VENTOLIN HFA) 108 (90 Base) MCG/ACT inhaler Inhale 2 puffs into the lungs every 6 (six) hours as needed for wheezing or shortness of breath. 07/26/20   Horald Pollen, MD  amLODipine (NORVASC) 10 MG tablet Take 1 tablet (10 mg total) by mouth daily. 07/26/20   Horald Pollen, MD  aspirin EC 81 MG tablet Take 81 mg by mouth in the morning and at bedtime.     [provider]  atorvastatin (LIPITOR) 40 MG tablet Take 1 tablet (40 mg total) by mouth daily. 02/01/21   Horald Pollen, MD  Cholecalciferol (VITAMIN D3) 5000 units CAPS Take by mouth daily.    [provider]  Dulaglutide (TRULICITY) 1.5 XF/8.1WE SOPN INJECT 1.5 MG INTO THE SKIN ONCE A WEEK 01/23/21   Horald Pollen, MD  Fluticasone-Umeclidin-Vilant (TRELEGY ELLIPTA) 100-62.5-25 MCG/INH AEPB Inhale 1 puff into the lungs daily. 10/31/20   Horald Pollen, MD  KRILL OIL PO Take by mouth daily.    [provider]  Levocetirizine Dihydrochloride (XYZAL ALLERGY 24HR PO) Take by mouth.    [provider]  losartan-hydrochlorothiazide (HYZAAR) 100-12.5 MG tablet Take 1 tablet by mouth daily. 08/13/20   Horald Pollen, MD  metFORMIN (GLUCOPHAGE) 1000 MG tablet TAKE 1 TABLET BY MOUTH TWICE DAILY WITH A MEAL 01/11/21   Horald Pollen, MD  Multiple Vitamin (MULTIVITAMIN)  tablet Take 1 tablet by mouth daily.    [provider]  predniSONE (STERAPRED UNI-PAK 21 TAB) 10 MG (21) TBPK tablet Take by mouth daily. Take 6 tabs by mouth daily  for 2 days, then 5 tabs for 2 days, then 4 tabs for 2 days, then 3 tabs for 2 days, 2 tabs for 2 days, then 1 tab by mouth daily for 2 days 11/07/20   Marney Setting, NP    Family History Family History  Problem Relation Age of Onset   Heart disease Mother    Hypertension Mother    Stroke Mother    Diabetes Sister    Hypertension Sister     Social History Social History   Tobacco Use   Smoking status: Never    Smokeless tobacco: Never  Substance Use Topics   Alcohol use: Not Currently    Comment: socially   Drug use: Never     Allergies   Patient has no known allergies.   Review of Systems Review of Systems  Constitutional:  Negative for chills and fever.  HENT:  Positive for congestion. Negative for ear pain and sore throat.   Eyes:  Negative for discharge and redness.  Respiratory:  Positive for cough and wheezing. Negative for shortness of breath.   Gastrointestinal:  Negative for abdominal pain, diarrhea, nausea and vomiting.    Physical Exam Triage Vital Signs ED Triage Vitals  Enc Vitals Group     BP      Pulse      Resp      Temp      Temp src      SpO2      Weight      Height      Head Circumference      Peak Flow      Pain Score      Pain Loc      Pain Edu?      Excl. in [place]?    No data found.  Updated Vital Signs BP 119/82 (BP Location: Left Arm)    Pulse 96    Temp 98.1 F (36.7 C) (Oral)    Resp 16    SpO2 94%      Physical Exam Vitals and nursing note reviewed.  Constitutional:      General: She is not in acute distress.    Appearance: Normal appearance. She is not ill-appearing.  HENT:     Head: Normocephalic and atraumatic.     Nose: Congestion present.  Eyes:     Conjunctiva/sclera: Conjunctivae normal.  Cardiovascular:     Rate and Rhythm: Normal rate and regular rhythm.     Heart sounds: Normal heart sounds. No murmur heard. Pulmonary:     Effort: Pulmonary effort is normal. No respiratory distress.     Breath sounds: Wheezing (mild scattered) present. No rhonchi or rales.  Skin:    General: Skin is warm and dry.  Neurological:     Mental Status: She is alert.  Psychiatric:        Mood and Affect: Mood normal.        Thought Content: Thought content normal.     UC Treatments / Results  Labs (all labs ordered are listed, but only abnormal results are displayed) Labs Reviewed - No data to display  EKG   Radiology No results  found.  Procedures Procedures (including critical care time)  Medications Ordered in UC Medications - No data to display  Initial Impression /  Assessment and Plan / UC Course  I have reviewed the triage vital signs and the nursing notes.  Pertinent labs & imaging results that were available during my care of the patient were reviewed by me and considered in my medical decision making (see chart for details).    Will treat with paxlovid (patient reports no history of kidney disease) as well as antibiotic given known asthma. Recommended further evaluation in the ED with any worsening symptoms. Patient expresses understanding.  Final Clinical Impressions(s) / UC Diagnoses   Final diagnoses:  COVID-19  Moderate persistent asthma with acute exacerbation   Discharge Instructions   None    ED Prescriptions     Medication Sig Dispense Auth. Provider   nirmatrelvir/ritonavir EUA (PAXLOVID) 20 x 150 MG & 10 x 100MG  TABS Take 3 tablets by mouth 2 (two) times daily for 5 days. Take nirmatrelvir (150 mg) two tablets twice daily for 5 days and ritonavir (100 mg) one tablet twice daily for 5 days. 30 tablet Ewell Poe F, PA-C   doxycycline (VIBRAMYCIN) 100 MG capsule Take 1 capsule (100 mg total) by mouth 2 (two) times daily. 20 capsule Francene Finders, PA-C      PDMP not reviewed this encounter.   Francene Finders, PA-C 02/04/21 1116

## 2021-02-05 ENCOUNTER — Ambulatory Visit: Payer: Self-pay

## 2021-02-13 ENCOUNTER — Other Ambulatory Visit: Payer: Self-pay | Admitting: Emergency Medicine

## 2021-02-13 DIAGNOSIS — I152 Hypertension secondary to endocrine disorders: Secondary | ICD-10-CM

## 2021-02-20 ENCOUNTER — Telehealth: Payer: Self-pay | Admitting: Emergency Medicine

## 2021-02-20 ENCOUNTER — Other Ambulatory Visit: Payer: Self-pay | Admitting: Emergency Medicine

## 2021-02-20 MED ORDER — UMECLIDINIUM-VILANTEROL 62.5-25 MCG/ACT IN AEPB: 1.0000 | INHALATION_SPRAY | Freq: Every day | RESPIRATORY_TRACT | 7 refills | Status: DC

## 2021-02-20 NOTE — Telephone Encounter (Signed)
Patient states her insurance company will no longer cover the cost for Fluticasone-Umeclidin-Vilant (TRELEGY ELLIPTA) 100-62.5-25 MCG/INH AEPB  Patient states insurance will cover cost for flovent or anoro ellipta  Patient is requesting a rx for one of the two inhalers covered by her insurance  Pharmacy [facility]  [place], [address] [place]

## 2021-02-20 NOTE — Telephone Encounter (Signed)
New prescription for Anoro Ellipta sent to pharmacy of record.  Thanks.

## 2021-03-20 ENCOUNTER — Telehealth: Payer: Self-pay

## 2021-03-20 ENCOUNTER — Other Ambulatory Visit: Payer: Self-pay

## 2021-03-20 ENCOUNTER — Ambulatory Visit: Payer: Self-pay

## 2021-03-20 ENCOUNTER — Ambulatory Visit
Admission: RE | Admit: 2021-03-20 | Discharge: 2021-03-20 | Disposition: A | Discharge status: Home / Self Care | Payer: Managed Care, Other (non HMO) | Source: Ambulatory Visit | Attending: Emergency Medicine | Admitting: Emergency Medicine

## 2021-03-20 VITALS — BP 146/84 | HR 98 | Temp 97.6°F | Resp 20

## 2021-03-20 DIAGNOSIS — J449 Chronic obstructive pulmonary disease, unspecified: Secondary | ICD-10-CM

## 2021-03-20 DIAGNOSIS — R0602 Shortness of breath: Secondary | ICD-10-CM

## 2021-03-20 DIAGNOSIS — J3089 Other allergic rhinitis: Secondary | ICD-10-CM | POA: Diagnosis not present

## 2021-03-20 DIAGNOSIS — J4551 Severe persistent asthma with (acute) exacerbation: Secondary | ICD-10-CM | POA: Diagnosis not present

## 2021-03-20 DIAGNOSIS — J302 Other seasonal allergic rhinitis: Secondary | ICD-10-CM | POA: Diagnosis not present

## 2021-03-20 DIAGNOSIS — J441 Chronic obstructive pulmonary disease with (acute) exacerbation: Secondary | ICD-10-CM | POA: Diagnosis not present

## 2021-03-20 DIAGNOSIS — R0989 Other specified symptoms and signs involving the circulatory and respiratory systems: Secondary | ICD-10-CM

## 2021-03-20 DIAGNOSIS — M170 Bilateral primary osteoarthritis of knee: Secondary | ICD-10-CM

## 2021-03-20 DIAGNOSIS — R062 Wheezing: Secondary | ICD-10-CM

## 2021-03-20 DIAGNOSIS — R069 Unspecified abnormalities of breathing: Secondary | ICD-10-CM

## 2021-03-20 MED ORDER — ALBUTEROL SULFATE (2.5 MG/3ML) 0.083% IN NEBU
2.5000 mg | INHALATION_SOLUTION | Freq: Once | RESPIRATORY_TRACT | Status: AC
  Administered 2021-03-20: 2.5 mg via RESPIRATORY_TRACT

## 2021-03-20 MED ORDER — METHYLPREDNISOLONE 4 MG PO TBPK: ORAL_TABLET | ORAL | 0 refills | Status: DC

## 2021-03-20 MED ORDER — FLUTICASONE PROPIONATE 50 MCG/ACT NA SUSP: 2.0000 | Freq: Every day | NASAL | 2 refills | Status: DC

## 2021-03-20 MED ORDER — ALBUTEROL SULFATE HFA 108 (90 BASE) MCG/ACT IN AERS: 2.0000 | INHALATION_SPRAY | Freq: Four times a day (QID) | RESPIRATORY_TRACT | 2 refills | Status: DC | PRN

## 2021-03-20 MED ORDER — UMECLIDINIUM-VILANTEROL 62.5-25 MCG/ACT IN AEPB: 1.0000 | INHALATION_SPRAY | Freq: Every day | RESPIRATORY_TRACT | 5 refills | Status: DC

## 2021-03-20 MED ORDER — FLOVENT DISKUS 250 MCG/ACT IN AEPB: 1.0000 | INHALATION_SPRAY | Freq: Two times a day (BID) | RESPIRATORY_TRACT | 5 refills | Status: DC

## 2021-03-20 MED ORDER — DICLOFENAC SODIUM 1 % EX GEL: 4.0000 g | Freq: Four times a day (QID) | CUTANEOUS | 2 refills | Status: AC

## 2021-03-20 MED ORDER — ALBUTEROL SULFATE 1.25 MG/3ML IN NEBU: 1.0000 | INHALATION_SOLUTION | Freq: Four times a day (QID) | RESPIRATORY_TRACT | 2 refills | Status: DC | PRN

## 2021-03-20 MED ORDER — METHYLPREDNISOLONE SODIUM SUCC 125 MG IJ SOLR
125.0000 mg | Freq: Once | INTRAMUSCULAR | Status: AC
  Administered 2021-03-20: 125 mg via INTRAMUSCULAR

## 2021-03-20 MED ORDER — AZITHROMYCIN 250 MG PO TABS: 500.0000 mg | ORAL_TABLET | Freq: Every day | ORAL | 0 refills | Status: AC

## 2021-03-20 MED ORDER — AEROCHAMBER PLUS FLO-VU LARGE MISC: 1.0000 | Freq: Once | 0 refills | Status: AC

## 2021-03-20 MED ORDER — LEVOCETIRIZINE DIHYDROCHLORIDE 5 MG PO TABS: 5.0000 mg | ORAL_TABLET | Freq: Every evening | ORAL | 2 refills | Status: DC

## 2021-03-20 MED ORDER — MONTELUKAST SODIUM 10 MG PO TABS: 10.0000 mg | ORAL_TABLET | Freq: Every day | ORAL | 2 refills | Status: DC

## 2021-03-20 MED ORDER — TRELEGY ELLIPTA 200-62.5-25 MCG/ACT IN AEPB: 1.0000 | INHALATION_SPRAY | Freq: Every morning | RESPIRATORY_TRACT | 5 refills | Status: DC

## 2021-03-20 MED ORDER — IBUPROFEN 400 MG PO TABS: 400.0000 mg | ORAL_TABLET | Freq: Three times a day (TID) | ORAL | 0 refills | Status: DC | PRN

## 2021-03-20 NOTE — Discharge Instructions (Addendum)
Your symptoms and physical exam findings are concerning for a viral respiratory infection and have started up your underlying allergies and COPD, it is also possible that there is some bacteria in your lungs that could be complicating things.         Please see the list below for recommended medications, dosages and frequencies to provide relief of your current symptoms:    Methylprednisolone IM (Solu-Medrol):  To quickly address your significant respiratory inflammation, you were provided with an injection of methylprednisolone in the office today.  You should continue to feel the full benefit of the steroid for the next 4 to 6 hours.    Methylprednisolone (Medrol Dosepak): This is a steroid that will significantly calm your upper and lower airways, please take one row of tablets daily with your breakfast meal starting tomorrow morning until the prescription is complete.      Albuterol HFA: This is a bronchodilator, it relaxes the smooth muscles that constrict your airway in your lungs when you are feeling sick or having inflammation secondary to allergies or upper respiratory infection.  Please inhale 2 puffs twice daily every day using the spacer provided.  You can also inhale 2 more puffs as often as needed throughout the day for aggravating cough, chest tightness, feeling short of breath, wheezing.      Fluticasone umeclidinium vilanterol (Trelegy): This is the inhaler that used to take.  Trelegy contains 2 ingredients that are found in a ANORO, which are umeclidinium and vilanterol; ANORO does not have the inhaled steroid component, fluticasone.  I have sent a prescription for Trelegy to your pharmacy and provided you with a 14-day sample.  Please inhale 4 puffs of albuterol first thing in the morning followed by 1 puff of Trelegy.    In 14 days, we will be able to sort out whether or not your insurance will pay for it at an affordable price.  If it your insurance will not cover Trelegy for you, I  have also sent your prescription for Flovent which is the steroid component, fluticasone.  Please pick up Flovent when the Trelegy sample runs out and use albuterol, Anoro and Flovent daily as follows:     Inhale 4 puffs of albuterol first thing in the morning.    Inhale a one puff of ANORO.    Inhale 2 puffs of Flovent.  Azithromycin (Z-Pak):  take 2 tablets twice daily for 3 days.   Ibuprofen  (Advil, Motrin): This is a good anti-inflammatory medication which addresses aches, pains and inflammation of the upper airways that causes sinus and nasal congestion as well as in the lower airways which makes your cough feel tight and sometimes burn.  I recommend that you take between 400 to 600 mg every 6-8 hours as needed, I have provided you with a prescription for 400 mg.      Fluticasone (Flonase): This is a steroid nasal spray that you use once daily, 1 spray in each nare.  After 3 to 5 days of use, you will have significant improvement of the inflammation and mucus production that is being caused by exposure to allergens.  This medication can be purchased over-the-counter however I have provided you with a prescription.      Levocetirizine (Xyzal): This is an excellent second-generation antihistamine that helps to reduce respiratory inflammatory response to viruses and environmental allergens.  Please take 1 tablet daily at bedtime.  I have renewed your prescription  Montelukast (Singulair): This is a mast cell stabilizer  which helps levo cetirizine work better and is highly recommended for asthma and COPD suffers.  It keeps the lung tissue stable and helps prevent inflammation from occurring.  With the exception of the steroid, I recommend that you take all of these medications regularly to prevent further exacerbations of your COPD/asthma.   Conservative care is also recommended at this time.  This includes rest, pushing clear fluids and activity as tolerated.  Warm beverages such as teas and  broths versus cold beverages/popsicles and frozen sherbet/sorbet are your choice, both warm and cold are beneficial.  You may also notice that your appetite is reduced; this is okay as long as you are drinking plenty of clear fluids.  I provided you with a note to be out of work.   Please follow-up within the next 3 to 5 days either with your primary care provider or urgent care if your symptoms do not resolve.  If you do not have a primary care provider, we will assist you in finding one.  This urgent care location is open Monday through Friday from 8-4.  You for visiting urgent care today.  I appreciate the opportunity to participate in your care.

## 2021-03-20 NOTE — ED Provider Notes (Signed)
[facility]    CSN: [id number] Arrival date & time: 03/20/21  0806    HISTORY   Chief Complaint  Patient presents with   Shortness of Breath   Asthma   HPI Catherine Cline is a 66 y.o. female. Patient presents to urgent care today with a chief complaint of asthma exacerbation.  Patient states she is currently using her albuterol inhaler and nebulizer treatment, as well as Anoro.  Patient states she used to be on Trelegy however her insurance this year, Christella Scheuermann, refuses to pay for it.  Patient states that her doctor switched her to [facility] and that she does not feel it works as well.  EMR reviewed, when patient advised her provider that her insurance would pay for a Anoro or Flovent, only on Anoro was prescribed for her.  Was diagnosed with COVID February 04, 2021, patient was also seen for similar exacerbation in October 2022.  Patient expresses frustration with frequent exacerbations.  Patient also reports a history of allergies, states she was tested as a young child, states she is allergic to "everything".  She has been prescribed Xyzal in the past, states she is not currently taking it because she does not feel like it helps.  Patient denies fever, aches, chills, nausea, vomiting, diarrhea, headache, otalgia, cough productive of thick sputum.  Patient endorses feeling like she is wheezing, nasal congestion, postnasal drip, shortness of breath with exertion, difficulty sleeping at night due to multiple awakenings feeling short of breath.  The history is provided by the patient.  Past Medical History:  Diagnosis Date   Arthritis    Asthma    Hypertension    Patient Active Problem List   Diagnosis Date Noted   Incisional hernia, without obstruction or gangrene 07/26/2020   Heart murmur 07/26/2020   Body mass index (BMI) of 40.1-44.9 in adult ([place]) 10/12/2019   Moderate persistent asthma with acute exacerbation 07/13/2019   Primary osteoarthritis involving multiple joints  07/14/2018   Chronic joint pain 07/14/2018   Dyslipidemia associated with type 2 diabetes mellitus ([place]) 01/14/2018   Hypertension associated with diabetes ([place]) 07/07/2017   Hyperlipidemia 07/07/2017   Past Surgical History:  Procedure Laterality Date   OVARIAN CYST REMOVAL     OB History   No obstetric history on file.    Home Medications    Prior to Admission medications   Medication Sig Start Date End Date Taking? Authorizing Provider  albuterol (VENTOLIN HFA) 108 (90 Base) MCG/ACT inhaler Inhale 2 puffs into the lungs every 6 (six) hours as needed for wheezing or shortness of breath. 07/26/20   Horald Pollen, MD  amLODipine (NORVASC) 10 MG tablet Take 1 tablet (10 mg total) by mouth daily. 07/26/20   Horald Pollen, MD  aspirin EC 81 MG tablet Take 81 mg by mouth in the morning and at bedtime.     [provider]  atorvastatin (LIPITOR) 40 MG tablet Take 1 tablet (40 mg total) by mouth daily. 02/01/21   Horald Pollen, MD  Cholecalciferol (VITAMIN D3) 5000 units CAPS Take by mouth daily.    [provider]  doxycycline (VIBRAMYCIN) 100 MG capsule Take 1 capsule (100 mg total) by mouth 2 (two) times daily. 02/04/21   Francene Finders, PA-C  Dulaglutide (TRULICITY) 1.5 CW/2.3JS SOPN INJECT 1.5 MG INTO THE SKIN ONCE A WEEK 01/23/21   Horald Pollen, MD  KRILL OIL PO Take by mouth daily.    [provider]  Levocetirizine  Dihydrochloride (XYZAL ALLERGY 24HR PO) Take by mouth.    [provider]  losartan-hydrochlorothiazide (HYZAAR) 100-12.5 MG tablet Take 1 tablet by mouth daily. 08/13/20   Horald Pollen, MD  metFORMIN (GLUCOPHAGE) 1000 MG tablet TAKE 1 TABLET BY MOUTH TWICE DAILY WITH A MEAL 02/13/21   Horald Pollen, MD  Multiple Vitamin (MULTIVITAMIN) tablet Take 1 tablet by mouth daily.    [provider]  umeclidinium-vilanterol (ANORO ELLIPTA) 62.5-25 MCG/ACT AEPB Inhale 1 puff into the lungs  daily at 6 (six) AM. 02/20/21   Sagardia, Ines Bloomer, MD   Family History Family History  Problem Relation Age of Onset   Heart disease Mother    Hypertension Mother    Stroke Mother    Diabetes Sister    Hypertension Sister    Social History Social History   Tobacco Use   Smoking status: Never   Smokeless tobacco: Never  Substance Use Topics   Alcohol use: Not Currently    Comment: socially   Drug use: Never   Allergies   Patient has no known allergies.  Review of Systems Review of Systems Pertinent findings noted in history of present illness.   Physical Exam Triage Vital Signs ED Triage Vitals  Enc Vitals Group     BP 11/30/20 0827 (!) 147/82     Pulse Rate 11/30/20 0827 72     Resp 11/30/20 0827 18     Temp 11/30/20 0827 98.3 F (36.8 C)     Temp Source 11/30/20 0827 Oral     SpO2 11/30/20 0827 98 %     Weight --      Height --      Head Circumference --      Peak Flow --      Pain Score 11/30/20 0826 5     Pain Loc --      Pain Edu? --      Excl. in [place]? --   No data found.  Updated Vital Signs BP (!) 146/84 (BP Location: Left Arm)    Pulse 98    Temp 97.6 F (36.4 C) (Oral)    Resp 20    SpO2 94%   Physical Exam Vitals and nursing note reviewed.  Constitutional:      General: She is not in acute distress.    Appearance: Normal appearance. She is not ill-appearing.  HENT:     Head: Normocephalic and atraumatic.     Right Ear: Hearing and ear canal normal.     Left Ear: Hearing and ear canal normal.     Ears:     Comments: Bilateral TMs bulging with clear fluid    Nose: Mucosal edema, congestion and rhinorrhea present. Rhinorrhea is clear.     Right Nostril: No foreign body, epistaxis, septal hematoma or occlusion.     Left Nostril: No foreign body, epistaxis, septal hematoma or occlusion.     Right Turbinates: Enlarged and swollen.     Left Turbinates: Enlarged and swollen.     Mouth/Throat:     Lips: Pink.     Mouth: Mucous membranes are  moist.     Pharynx: No pharyngeal swelling, oropharyngeal exudate, posterior oropharyngeal erythema or uvula swelling.     Tonsils: 0 on the right. 0 on the left.  Eyes:     General: Lids are normal.        Right eye: No discharge.        Left eye: No discharge.  Extraocular Movements: Extraocular movements intact.     Conjunctiva/sclera: Conjunctivae normal.     Right eye: Right conjunctiva is not injected.     Left eye: Left conjunctiva is not injected.     Pupils: Pupils are equal, round, and reactive to light.  Neck:     Trachea: Trachea and phonation normal. No tracheal tenderness.  Cardiovascular:     Rate and Rhythm: Normal rate and regular rhythm.     Pulses: Normal pulses.     Heart sounds: Normal heart sounds. No murmur heard.   No friction rub. No gallop.  Pulmonary:     Effort: Pulmonary effort is normal.     Breath sounds: No stridor, decreased air movement or transmitted upper airway sounds. Examination of the right-upper field reveals wheezing. Examination of the left-upper field reveals wheezing. Examination of the right-middle field reveals wheezing. Examination of the left-middle field reveals wheezing and rhonchi. Examination of the right-lower field reveals wheezing and rales. Examination of the left-lower field reveals wheezing and rales. Wheezing, rhonchi and rales present. No decreased breath sounds.     Comments: After nebulizer treatment and methylprednisolone injection, there was significant improvement of wheezing, which was still present, rhonchi resolved completely, Rales persisted in bilateral lung bases. Chest:     Chest wall: No tenderness.  Abdominal:     General: Abdomen is flat. Bowel sounds are normal.     Palpations: Abdomen is soft.     Tenderness: There is no abdominal tenderness.     Hernia: No hernia is present.  Musculoskeletal:        General: Normal range of motion.     Cervical back: Full passive range of motion without pain, normal  range of motion and neck supple. No edema, erythema, rigidity or crepitus. No pain with movement. Normal range of motion.     Right lower leg: No edema.     Left lower leg: No edema.  Lymphadenopathy:     Cervical: Cervical adenopathy present.     Right cervical: Superficial cervical adenopathy present.     Left cervical: Superficial cervical adenopathy present.  Skin:    General: Skin is warm and dry.     Findings: No erythema or rash.     Comments: Skin is warm and dry to touch, good turgor, no rash appreciated  Neurological:     General: No focal deficit present.     Mental Status: She is alert and oriented to person, place, and time.     Motor: Motor function is intact.     Coordination: Coordination is intact.     Gait: Gait is intact.     Deep Tendon Reflexes:     Reflex Scores:      Patellar reflexes are 2+ on the right side and 2+ on the left side. Psychiatric:        Attention and Perception: Attention and perception normal.        Mood and Affect: Mood normal.        Speech: Speech normal.        Behavior: Behavior normal. Behavior is cooperative.        Thought Content: Thought content normal.        Cognition and Memory: Cognition normal.        Judgment: Judgment normal.    Visual Acuity Right Eye Distance:   Left Eye Distance:   Bilateral Distance:    Right Eye Near:   Left Eye Near:    Bilateral Near:  UC Couse / Diagnostics / Procedures:    EKG  Radiology No results found.  Procedures Procedures (including critical care time)  UC Diagnoses / Final Clinical Impressions(s)   I have reviewed the triage vital signs and the nursing notes.  Pertinent labs & imaging results that were available during my care of the patient were reviewed by me and considered in my medical decision making (see chart for details).   Final diagnoses:  Severe persistent asthma with acute exacerbation  COPD exacerbation (HCC)  Breathing sounds, abnormal  Wheezing   Bilateral rales  Shortness of breath  Perennial allergic rhinitis with seasonal variation  Chronic obstructive pulmonary disease, unspecified COPD type (HCC)  Primary osteoarthritis of both knees   Patient provided with bronchodilators, steroids, antibiotics and renewal of Trelegy and attempt to get this approved for her.  Have also provided her with Flovent in the event that is not.  Patient advised to follow-up in a few days with her with her primary care provider or here to recheck breathing and progress.  The emergency room for worsening symptoms.  ED Prescriptions     Medication Sig Dispense Auth. Provider   umeclidinium-vilanterol (ANORO ELLIPTA) 62.5-25 MCG/ACT AEPB Inhale 1 puff into the lungs daily at 6 (six) AM. 30 each Lynden Oxford Scales, PA-C   Fluticasone Propionate, Inhal, (FLOVENT DISKUS) 250 MCG/ACT AEPB Inhale 1 puff into the lungs 2 (two) times daily after a meal. 1 each Lynden Oxford Scales, PA-C   albuterol (VENTOLIN HFA) 108 (90 Base) MCG/ACT inhaler Inhale 2 puffs into the lungs every 6 (six) hours as needed for wheezing or shortness of breath (Cough). 36 g Lynden Oxford Scales, PA-C   fluticasone (FLONASE) 50 MCG/ACT nasal spray Place 2 sprays into both nostrils daily. 18 mL Lynden Oxford Scales, PA-C   ibuprofen (ADVIL) 400 MG tablet Take 1 tablet (400 mg total) by mouth every 8 (eight) hours as needed for up to 30 doses. 30 tablet Lynden Oxford Scales, PA-C   Spacer/Aero-Holding Chambers (AEROCHAMBER PLUS FLO-VU LARGE) MISC 1 each by Other route once for 1 dose. 1 each Lynden Oxford Scales, PA-C   montelukast (SINGULAIR) 10 MG tablet Take 1 tablet (10 mg total) by mouth at bedtime. 30 tablet Lynden Oxford Scales, PA-C   methylPREDNISolone (MEDROL DOSEPAK) 4 MG TBPK tablet Take 24 mg on day 1, 20 mg on day 2, 16 mg on day 3, 12 mg on day 4, 8 mg on day 5, 4 mg on day 6. 21 tablet Lynden Oxford Scales, PA-C   azithromycin (ZITHROMAX) 250 MG tablet Take 2  tablets (500 mg total) by mouth daily for 3 days. 6 tablet Lynden Oxford Scales, PA-C   levocetirizine (XYZAL ALLERGY 24HR) 5 MG tablet Take 1 tablet (5 mg total) by mouth every evening. 30 tablet Lynden Oxford Scales, PA-C   Fluticasone-Umeclidin-Vilant (TRELEGY ELLIPTA) 200-62.5-25 MCG/ACT AEPB Inhale 1 puff into the lungs in the morning. 1 each Lynden Oxford Scales, PA-C   albuterol (ACCUNEB) 1.25 MG/3ML nebulizer solution Take 3 mLs (1.25 mg total) by nebulization every 6 (six) hours as needed for wheezing. 360 mL Lynden Oxford Scales, PA-C   diclofenac Sodium (VOLTAREN) 1 % GEL Apply 4 g topically 4 (four) times daily. 350 g Lynden Oxford Scales, PA-C      PDMP not reviewed this encounter.  Pending results:  Labs Reviewed - No data to display  Medications Ordered in UC: Medications  methylPREDNISolone sodium succinate (SOLU-MEDROL) 125 mg/2 mL injection 125 mg (125 mg Intramuscular  Given 03/20/21 0857)  albuterol (PROVENTIL) (2.5 MG/3ML) 0.083% nebulizer solution 2.5 mg (2.5 mg Nebulization Given 03/20/21 0857)    Disposition Upon Discharge:  Condition: stable for discharge home Home: take medications as prescribed; routine discharge instructions as discussed; follow up as advised.  Patient presented with an acute illness with associated systemic symptoms and significant discomfort requiring urgent management. In my opinion, this is a condition that a prudent lay person (someone who possesses an average knowledge of health and medicine) may potentially expect to result in complications if not addressed urgently such as respiratory distress, impairment of bodily function or dysfunction of bodily organs.   Routine symptom specific, illness specific and/or disease specific instructions were discussed with the patient and/or caregiver at length.   As such, the patient has been evaluated and assessed, work-up was performed and treatment was provided in alignment with urgent care  protocols and evidence based medicine.  Patient/parent/caregiver has been advised that the patient may require follow up for further testing and treatment if the symptoms continue in spite of treatment, as clinically indicated and appropriate.  If the patient was tested for COVID-19, Influenza and/or RSV, then the patient/parent/guardian was advised to isolate at home pending the results of his/her diagnostic coronavirus test and potentially longer if theyre positive. I have also advised pt that if his/her COVID-19 test returns positive, it's recommended to self-isolate for at least 10 days after symptoms first appeared AND until fever-free for 24 hours without fever reducer AND other symptoms have improved or resolved. Discussed self-isolation recommendations as well as instructions for household member/close contacts as per the [facility] and [place] DHHS, and also gave patient the [place] packet with this information.  Patient/parent/caregiver has been advised to return to the [facility] or PCP in 3-5 days if no better; to PCP or the Emergency Department if new signs and symptoms develop, or if the current signs or symptoms continue to change or worsen for further workup, evaluation and treatment as clinically indicated and appropriate  The patient will follow up with their current PCP if and as advised. If the patient does not currently have a PCP we will assist them in obtaining one.   The patient may need specialty follow up if the symptoms continue, in spite of conservative treatment and management, for further workup, evaluation, consultation and treatment as clinically indicated and appropriate.  Patient/parent/caregiver verbalized understanding and agreement of plan as discussed.  All questions were addressed during visit.  Please see discharge instructions below for further details of plan.  Discharge Instructions:   Discharge Instructions      Your symptoms and physical exam findings are concerning for a  viral respiratory infection and have started up your underlying allergies and COPD, it is also possible that there is some bacteria in your lungs that could be complicating things.         Please see the list below for recommended medications, dosages and frequencies to provide relief of your current symptoms:    Methylprednisolone IM (Solu-Medrol):  To quickly address your significant respiratory inflammation, you were provided with an injection of methylprednisolone in the office today.  You should continue to feel the full benefit of the steroid for the next 4 to 6 hours.    Methylprednisolone (Medrol Dosepak): This is a steroid that will significantly calm your upper and lower airways, please take one row of tablets daily with your breakfast meal starting tomorrow morning until the prescription is complete.      Albuterol  HFA: This is a bronchodilator, it relaxes the smooth muscles that constrict your airway in your lungs when you are feeling sick or having inflammation secondary to allergies or upper respiratory infection.  Please inhale 2 puffs twice daily every day using the spacer provided.  You can also inhale 2 more puffs as often as needed throughout the day for aggravating cough, chest tightness, feeling short of breath, wheezing.      Fluticasone umeclidinium vilanterol (Trelegy): This is the inhaler that used to take.  Trelegy contains 2 ingredients that are found in a ANORO, which are umeclidinium and vilanterol; ANORO does not have the inhaled steroid component, fluticasone.  I have sent a prescription for Trelegy to your pharmacy and provided you with a 14-day sample.  Please inhale 4 puffs of albuterol first thing in the morning followed by 1 puff of Trelegy.    In 14 days, we will be able to sort out whether or not your insurance will pay for it at an affordable price.  If it your insurance will not cover Trelegy for you, I have also sent your prescription for Flovent which is the  steroid component, fluticasone.  Please pick up Flovent when the Trelegy sample runs out and use albuterol, Anoro and Flovent daily as follows:     Inhale 4 puffs of albuterol first thing in the morning.    Inhale a one puff of ANORO.    Inhale 2 puffs of Flovent.  Azithromycin (Z-Pak):  take 2 tablets twice daily for 3 days.   Ibuprofen  (Advil, Motrin): This is a good anti-inflammatory medication which addresses aches, pains and inflammation of the upper airways that causes sinus and nasal congestion as well as in the lower airways which makes your cough feel tight and sometimes burn.  I recommend that you take between 400 to 600 mg every 6-8 hours as needed, I have provided you with a prescription for 400 mg.      Fluticasone (Flonase): This is a steroid nasal spray that you use once daily, 1 spray in each nare.  After 3 to 5 days of use, you will have significant improvement of the inflammation and mucus production that is being caused by exposure to allergens.  This medication can be purchased over-the-counter however I have provided you with a prescription.      Levocetirizine (Xyzal): This is an excellent second-generation antihistamine that helps to reduce respiratory inflammatory response to viruses and environmental allergens.  Please take 1 tablet daily at bedtime.  I have renewed your prescription  Montelukast (Singulair): This is a mast cell stabilizer which helps levo cetirizine work better and is highly recommended for asthma and COPD suffers.  It keeps the lung tissue stable and helps prevent inflammation from occurring.  With the exception of the steroid, I recommend that you take all of these medications regularly to prevent further exacerbations of your COPD/asthma.   Conservative care is also recommended at this time.  This includes rest, pushing clear fluids and activity as tolerated.  Warm beverages such as teas and broths versus cold beverages/popsicles and frozen  sherbet/sorbet are your choice, both warm and cold are beneficial.  You may also notice that your appetite is reduced; this is okay as long as you are drinking plenty of clear fluids.  I provided you with a note to be out of work.   Please follow-up within the next 3 to 5 days either with your primary care provider or urgent care if your  symptoms do not resolve.  If you do not have a primary care provider, we will assist you in finding one.  This urgent care location is open Monday through Friday from 8-4.  You for visiting urgent care today.  I appreciate the opportunity to participate in your care.       This office note has been dictated using Museum/gallery curator.  Unfortunately, and despite my best efforts, this method of dictation can sometimes lead to occasional typographical or grammatical errors.  I apologize in advance if this occurs.     Lynden Oxford Scales, PA-C 03/20/21 1326

## 2021-03-20 NOTE — ED Triage Notes (Signed)
Pt reports having an asthma exacerbation, he reports her albuterol inhaler and neb tx are not helping.

## 2021-03-20 NOTE — Telephone Encounter (Signed)
[place] called stating the patients insurance does not cover Ventolin but will cover Proventil. Per providers approval pharmacy was able to switch to medication that was covered

## 2021-05-01 ENCOUNTER — Ambulatory Visit (INDEPENDENT_AMBULATORY_CARE_PROVIDER_SITE_OTHER): Payer: Managed Care, Other (non HMO) | Admitting: Emergency Medicine

## 2021-05-01 ENCOUNTER — Encounter: Payer: Self-pay | Admitting: Emergency Medicine

## 2021-05-01 VITALS — BP 118/72 | HR 91 | Temp 97.7°F | Ht 62.0 in | Wt 220.2 lb

## 2021-05-01 DIAGNOSIS — D649 Anemia, unspecified: Secondary | ICD-10-CM

## 2021-05-01 DIAGNOSIS — J4541 Moderate persistent asthma with (acute) exacerbation: Secondary | ICD-10-CM | POA: Diagnosis not present

## 2021-05-01 DIAGNOSIS — E1169 Type 2 diabetes mellitus with other specified complication: Secondary | ICD-10-CM

## 2021-05-01 DIAGNOSIS — E1159 Type 2 diabetes mellitus with other circulatory complications: Secondary | ICD-10-CM

## 2021-05-01 DIAGNOSIS — Z889 Allergy status to unspecified drugs, medicaments and biological substances status: Secondary | ICD-10-CM

## 2021-05-01 DIAGNOSIS — E785 Hyperlipidemia, unspecified: Secondary | ICD-10-CM | POA: Diagnosis not present

## 2021-05-01 DIAGNOSIS — I152 Hypertension secondary to endocrine disorders: Secondary | ICD-10-CM

## 2021-05-01 DIAGNOSIS — Z1211 Encounter for screening for malignant neoplasm of colon: Secondary | ICD-10-CM

## 2021-05-01 LAB — CBC WITH DIFFERENTIAL/PLATELET
Basophils Absolute: 0 10*3/uL (ref 0.0–0.1)
Basophils Relative: 0.4 % (ref 0.0–3.0)
Eosinophils Absolute: 0.2 10*3/uL (ref 0.0–0.7)
Eosinophils Relative: 2.8 % (ref 0.0–5.0)
HCT: 34.1 % — ABNORMAL LOW (ref 36.0–46.0)
Hemoglobin: 10.7 g/dL — ABNORMAL LOW (ref 12.0–15.0)
Lymphocytes Relative: 16.6 % (ref 12.0–46.0)
Lymphs Abs: 1.3 10*3/uL (ref 0.7–4.0)
MCHC: 31.3 g/dL (ref 30.0–36.0)
MCV: 71.3 fl — ABNORMAL LOW (ref 78.0–100.0)
Monocytes Absolute: 0.3 10*3/uL (ref 0.1–1.0)
Monocytes Relative: 4.3 % (ref 3.0–12.0)
Neutro Abs: 6 10*3/uL (ref 1.4–7.7)
Neutrophils Relative %: 75.9 % (ref 43.0–77.0)
Platelets: 373 10*3/uL (ref 150.0–400.0)
RBC: 4.78 Mil/uL (ref 3.87–5.11)
RDW: 17.7 % — ABNORMAL HIGH (ref 11.5–15.5)
WBC: 8 10*3/uL (ref 4.0–10.5)

## 2021-05-01 LAB — LIPID PANEL
Cholesterol: 129 mg/dL (ref 0–200)
HDL: 41.3 mg/dL (ref >39.00)
LDL Cholesterol: 61 mg/dL (ref 0–99)
NonHDL: 87.66
Total CHOL/HDL Ratio: 3
Triglycerides: 133 mg/dL (ref 0.0–149.0)
VLDL: 26.6 mg/dL (ref 0.0–40.0)

## 2021-05-01 LAB — COMPREHENSIVE METABOLIC PANEL
ALT: 59 U/L — ABNORMAL HIGH (ref 0–35)
AST: 30 U/L (ref 0–37)
Albumin: 4.4 g/dL (ref 3.5–5.2)
Alkaline Phosphatase: 123 U/L — ABNORMAL HIGH (ref 39–117)
BUN: 23 mg/dL (ref 6–23)
CO2: 30 mEq/L (ref 19–32)
Calcium: 9.9 mg/dL (ref 8.4–10.5)
Chloride: 101 mEq/L (ref 96–112)
Creatinine, Ser: 0.87 mg/dL (ref 0.40–1.20)
GFR: 70.27 mL/min (ref >60.00)
Glucose, Bld: 128 mg/dL — ABNORMAL HIGH (ref 70–99)
Potassium: 4 mEq/L (ref 3.5–5.1)
Sodium: 139 mEq/L (ref 135–145)
Total Bilirubin: 0.5 mg/dL (ref 0.2–1.2)
Total Protein: 7.9 g/dL (ref 6.0–8.3)

## 2021-05-01 LAB — POCT GLYCOSYLATED HEMOGLOBIN (HGB A1C): Hemoglobin A1C: 7.1 % — AB (ref 4.0–5.6)

## 2021-05-01 NOTE — Assessment & Plan Note (Signed)
Stable.  Diet and nutrition discussed.  Continue atorvastatin 40 mg daily. ?The 10-year ASCVD risk score (Arnett DK, et al., 2019) is: 9.9% ?  Values used to calculate the score: ?    Age: 66 years ?    Sex: Female ?    Is Non-Hispanic African American: No ?    Diabetic: Yes ?    Tobacco smoker: No ?    Systolic Blood Pressure: 169 mmHg ?    Is BP treated: Yes ?    HDL Cholesterol: 46 mg/dL ?    Total Cholesterol: 149 mg/dL ? ?

## 2021-05-01 NOTE — Assessment & Plan Note (Signed)
Well-controlled hypertension.  Continue Hyzaar 100-12.5 mg and amlodipine 10 mg daily. ?Hemoglobin A1c not at goal at 7.1. ?Continue Trulicity 1.5 mg weekly and metformin 1000 mg twice a day. ?Cardiovascular risks associated with uncontrolled diabetes and hypertension discussed. ?Diet and nutrition discussed. ?Follow-up in 3 months. ?

## 2021-05-01 NOTE — Patient Instructions (Signed)

## 2021-05-01 NOTE — Assessment & Plan Note (Signed)
Stable and much improved.  Continue Anoro Ellipta and albuterol as needed. ?

## 2021-05-01 NOTE — Progress Notes (Signed)
Catherine Cline ?66 y.o. ? ? ?Chief Complaint  ?Patient presents with  ? Follow-up  ? Allergies  ? ? ?HISTORY OF PRESENT ILLNESS: ?This is a 66 y.o. female with history of diabetes, hypertension, and dyslipidemia here for follow-up ?Also has history of asthma and chronic allergies ?Overall doing well. ?No other complaints or medical concerns today. ? ?HPI ? ? ?Prior to Admission medications   ?Medication Sig Start Date End Date Taking? Authorizing Provider  ?albuterol (ACCUNEB) 1.25 MG/3ML nebulizer solution Take 3 mLs (1.25 mg total) by nebulization every 6 (six) hours as needed for wheezing. 03/20/21 06/18/21 Yes Lynden Oxford Scales, PA-C  ?albuterol (VENTOLIN HFA) 108 (90 Base) MCG/ACT inhaler Inhale 2 puffs into the lungs every 6 (six) hours as needed for wheezing or shortness of breath (Cough). 03/20/21 06/18/21 Yes Lynden Oxford Scales, PA-C  ?amLODipine (NORVASC) 10 MG tablet Take 1 tablet (10 mg total) by mouth daily. 07/26/20  Yes SagardiaInes Bloomer, MD  ?aspirin EC 81 MG tablet Take 81 mg by mouth in the morning and at bedtime.    Yes [provider]  ?atorvastatin (LIPITOR) 40 MG tablet Take 1 tablet (40 mg total) by mouth daily. 02/01/21  Yes SagardiaInes Bloomer, MD  ?Cholecalciferol (VITAMIN D3) 5000 units CAPS Take by mouth daily.   Yes [provider]  ?diclofenac Sodium (VOLTAREN) 1 % GEL Apply 4 g topically 4 (four) times daily. 03/20/21 06/18/21 Yes Lynden Oxford Scales, PA-C  ?doxycycline (VIBRAMYCIN) 100 MG capsule Take 1 capsule (100 mg total) by mouth 2 (two) times daily. 02/04/21  Yes Francene Finders, PA-C  ?Dulaglutide (TRULICITY) 1.5 JH/4.1DE SOPN INJECT 1.5 MG INTO THE SKIN ONCE A WEEK 01/23/21  Yes Cozetta Seif, Ines Bloomer, MD  ?fluticasone (FLONASE) 50 MCG/ACT nasal spray Place 2 sprays into both nostrils daily. 03/20/21  Yes Lynden Oxford Scales, PA-C  ?Fluticasone Propionate, Inhal, (FLOVENT DISKUS) 250 MCG/ACT AEPB Inhale 1 puff into the lungs 2 (two) times daily  after a meal. 03/20/21 09/16/21 Yes Lynden Oxford Scales, PA-C  ?Fluticasone-Umeclidin-Vilant (TRELEGY ELLIPTA) 200-62.5-25 MCG/ACT AEPB Inhale 1 puff into the lungs in the morning. 03/20/21 09/16/21 Yes Lynden Oxford Scales, PA-C  ?ibuprofen (ADVIL) 400 MG tablet Take 1 tablet (400 mg total) by mouth every 8 (eight) hours as needed for up to 30 doses. 03/20/21  Yes Lynden Oxford Scales, PA-C  ?KRILL OIL PO Take by mouth daily.   Yes [provider]  ?levocetirizine (XYZAL ALLERGY 24HR) 5 MG tablet Take 1 tablet (5 mg total) by mouth every evening. 03/20/21 06/18/21 Yes Lynden Oxford Scales, PA-C  ?losartan-hydrochlorothiazide (HYZAAR) 100-12.5 MG tablet Take 1 tablet by mouth daily. 08/13/20  Yes [place], Ines Bloomer, MD  ?metFORMIN (GLUCOPHAGE) 1000 MG tablet TAKE 1 TABLET BY MOUTH TWICE DAILY WITH A MEAL 02/13/21  Yes Trinitee Horgan, Ines Bloomer, MD  ?methylPREDNISolone (MEDROL DOSEPAK) 4 MG TBPK tablet Take 24 mg on day 1, 20 mg on day 2, 16 mg on day 3, 12 mg on day 4, 8 mg on day 5, 4 mg on day 6. 03/20/21  Yes Lynden Oxford Scales, PA-C  ?montelukast (SINGULAIR) 10 MG tablet Take 1 tablet (10 mg total) by mouth at bedtime. 03/20/21 06/18/21 Yes Lynden Oxford Scales, PA-C  ?Multiple Vitamin (MULTIVITAMIN) tablet Take 1 tablet by mouth daily.   Yes [provider]  ?umeclidinium-vilanterol (ANORO ELLIPTA) 62.5-25 MCG/ACT AEPB Inhale 1 puff into the lungs daily at 6 (six) AM. 03/20/21 09/16/21 Yes Lynden Oxford Scales, PA-C  ? ? ?No  Known Allergies ? ?Patient Active Problem List  ? Diagnosis Date Noted  ? Incisional hernia, without obstruction or gangrene 07/26/2020  ? Heart murmur 07/26/2020  ? Body mass index (BMI) of 40.1-44.9 in adult [facility]) 10/12/2019  ? Moderate persistent asthma with acute exacerbation 07/13/2019  ? Primary osteoarthritis involving multiple joints 07/14/2018  ? Chronic joint pain 07/14/2018  ? Dyslipidemia associated with type 2 diabetes mellitus ([place]) 01/14/2018  ?  Hypertension associated with diabetes ([place]) 07/07/2017  ? Hyperlipidemia 07/07/2017  ? ? ?Past Medical History:  ?Diagnosis Date  ? Arthritis   ? Asthma   ? Hypertension   ? ? ?Past Surgical History:  ?Procedure Laterality Date  ? OVARIAN CYST REMOVAL    ? ? ?Social History  ? ?Socioeconomic History  ? Marital status: Single  ?  Spouse name: Not on file  ? Number of children: Not on file  ? Years of education: Not on file  ? Highest education level: Not on file  ?Occupational History  ? Not on file  ?Tobacco Use  ? Smoking status: Never  ? Smokeless tobacco: Never  ?Substance and Sexual Activity  ? Alcohol use: Not Currently  ?  Comment: socially  ? Drug use: Never  ? Sexual activity: Not on file  ?Other Topics Concern  ? Not on file  ?Social History Narrative  ? Not on file  ? ?Social Determinants of Health  ? ?Financial Resource Strain: Not on file  ?Food Insecurity: Not on file  ?Transportation Needs: Not on file  ?Physical Activity: Not on file  ?Stress: Not on file  ?Social Connections: Not on file  ?Intimate Partner Violence: Not on file  ? ? ?Family History  ?Problem Relation Age of Onset  ? Heart disease Mother   ? Hypertension Mother   ? Stroke Mother   ? Diabetes Sister   ? Hypertension Sister   ? ? ? ?Review of Systems  ?Constitutional: Negative.  Negative for chills and fever.  ?HENT: Negative.  Negative for congestion and sore throat.   ?Respiratory: Negative.  Negative for cough and sputum production.   ?Cardiovascular: Negative.  Negative for chest pain and palpitations.  ?Gastrointestinal:  Negative for abdominal pain, diarrhea, nausea and vomiting.  ?Genitourinary: Negative.   ?Skin: Negative.  Negative for rash.  ?Endo/Heme/Allergies:  Positive for environmental allergies.  ?All other systems reviewed and are negative. ? ?Today's Vitals  ? 05/01/21 0839  ?BP: 118/72  ?Pulse: 91  ?Temp: 97.7 ?F (36.5 ?C)  ?TempSrc: Oral  ?SpO2: 92%  ?Weight: 220 lb 4 oz (99.9 kg)  ?Height: '5\' 2"'$  (1.575 m)  ? ?Body  mass index is 40.28 kg/m?. ?Wt Readings from Last 3 Encounters:  ?05/01/21 220 lb 4 oz (99.9 kg)  ?10/31/20 218 lb (98.9 kg)  ?10/02/20 218 lb 9.6 oz (99.2 kg)  ? ? ?Physical Exam ?Vitals reviewed.  ?Constitutional:   ?   Appearance: Normal appearance.  ?HENT:  ?   Head: Normocephalic.  ?   Mouth/Throat:  ?   Mouth: Mucous membranes are moist.  ?   Pharynx: Oropharynx is clear.  ?Eyes:  ?   Extraocular Movements: Extraocular movements intact.  ?   Conjunctiva/sclera: Conjunctivae normal.  ?   Pupils: Pupils are equal, round, and reactive to light.  ?Cardiovascular:  ?   Rate and Rhythm: Normal rate and regular rhythm.  ?   Pulses: Normal pulses.  ?   Heart sounds: Normal heart sounds.  ?Pulmonary:  ?   Effort: Pulmonary  effort is normal.  ?   Breath sounds: Normal breath sounds.  ?Abdominal:  ?   Palpations: Abdomen is soft.  ?   Tenderness: There is no abdominal tenderness.  ?Musculoskeletal:     ?   General: Normal range of motion.  ?   Cervical back: No tenderness.  ?Lymphadenopathy:  ?   Cervical: No cervical adenopathy.  ?Skin: ?   General: Skin is warm and dry.  ?   Capillary Refill: Capillary refill takes less than 2 seconds.  ?Neurological:  ?   General: No focal deficit present.  ?   Mental Status: She is alert and oriented to person, place, and time.  ?Psychiatric:     ?   Mood and Affect: Mood normal.     ?   Behavior: Behavior normal.  ? ? ?Results for orders placed or performed in visit on 05/01/21 (from the past 24 hour(s))  ?POCT HgB A1C     Status: Abnormal  ? Collection Time: 05/01/21  8:51 AM  ?Result Value Ref Range  ? Hemoglobin A1C 7.1 (A) 4.0 - 5.6 %  ? HbA1c POC (<> result, manual entry)    ? HbA1c, POC (prediabetic range)    ? HbA1c, POC (controlled diabetic range)    ? ? ?ASSESSMENT & PLAN: ?A total of 50 minutes was spent with the patient and counseling/coordination of care regarding preparing for this visit, review of most recent office visit notes, review of most recent blood work results  including today's hemoglobin A1c, cardiovascular risks associated with diabetes and hypertension, education on nutrition, asthma and chronic allergies, prognosis, documentation and need for follow-up. ? ?Problem L

## 2021-05-01 NOTE — Assessment & Plan Note (Signed)
Stable.  Recently started on Singulair 10 mg daily and Flonase. ?

## 2021-05-13 ENCOUNTER — Encounter: Payer: Self-pay | Admitting: Internal Medicine

## 2021-05-14 ENCOUNTER — Other Ambulatory Visit: Payer: Self-pay | Admitting: Emergency Medicine

## 2021-05-14 DIAGNOSIS — E785 Hyperlipidemia, unspecified: Secondary | ICD-10-CM

## 2021-06-12 ENCOUNTER — Encounter: Payer: Self-pay | Admitting: Internal Medicine

## 2021-06-12 ENCOUNTER — Other Ambulatory Visit (INDEPENDENT_AMBULATORY_CARE_PROVIDER_SITE_OTHER): Payer: Commercial Managed Care - HMO

## 2021-06-12 ENCOUNTER — Telehealth: Payer: Self-pay | Admitting: *Deleted

## 2021-06-12 ENCOUNTER — Ambulatory Visit (INDEPENDENT_AMBULATORY_CARE_PROVIDER_SITE_OTHER): Payer: Commercial Managed Care - HMO | Admitting: Internal Medicine

## 2021-06-12 VITALS — BP 140/82 | HR 92 | Ht 62.0 in | Wt 218.0 lb

## 2021-06-12 DIAGNOSIS — R7989 Other specified abnormal findings of blood chemistry: Secondary | ICD-10-CM | POA: Diagnosis not present

## 2021-06-12 DIAGNOSIS — D649 Anemia, unspecified: Secondary | ICD-10-CM

## 2021-06-12 DIAGNOSIS — K219 Gastro-esophageal reflux disease without esophagitis: Secondary | ICD-10-CM

## 2021-06-12 DIAGNOSIS — Z1211 Encounter for screening for malignant neoplasm of colon: Secondary | ICD-10-CM

## 2021-06-12 LAB — PROTIME-INR
INR: 0.9 ratio (ref 0.8–1.0)
Prothrombin Time: 10.5 s (ref 9.6–13.1)

## 2021-06-12 LAB — IBC + FERRITIN
Ferritin: 11.1 ng/mL (ref 10.0–291.0)
Iron: 30 ug/dL — ABNORMAL LOW (ref 42–145)
Saturation Ratios: 6.5 % — ABNORMAL LOW (ref 20.0–50.0)
TIBC: 460.6 ug/dL — ABNORMAL HIGH (ref 250.0–450.0)
Transferrin: 329 mg/dL (ref 212.0–360.0)

## 2021-06-12 LAB — CBC WITH DIFFERENTIAL/PLATELET
Basophils Absolute: 0.1 10*3/uL (ref 0.0–0.1)
Basophils Relative: 0.6 % (ref 0.0–3.0)
Eosinophils Absolute: 0.1 10*3/uL (ref 0.0–0.7)
Eosinophils Relative: 1.5 % (ref 0.0–5.0)
HCT: 36.7 % (ref 36.0–46.0)
Hemoglobin: 11.5 g/dL — ABNORMAL LOW (ref 12.0–15.0)
Lymphocytes Relative: 15.5 % (ref 12.0–46.0)
Lymphs Abs: 1.4 10*3/uL (ref 0.7–4.0)
MCHC: 31.4 g/dL (ref 30.0–36.0)
MCV: 71.6 fl — ABNORMAL LOW (ref 78.0–100.0)
Monocytes Absolute: 0.4 10*3/uL (ref 0.1–1.0)
Monocytes Relative: 4.1 % (ref 3.0–12.0)
Neutro Abs: 7.1 10*3/uL (ref 1.4–7.7)
Neutrophils Relative %: 78.3 % — ABNORMAL HIGH (ref 43.0–77.0)
Platelets: 392 10*3/uL (ref 150.0–400.0)
RBC: 5.12 Mil/uL — ABNORMAL HIGH (ref 3.87–5.11)
RDW: 17.8 % — ABNORMAL HIGH (ref 11.5–15.5)
WBC: 9 10*3/uL (ref 4.0–10.5)

## 2021-06-12 MED ORDER — TRULICITY 1.5 MG/0.5ML ~~LOC~~ SOAJ: 1.5000 mg | SUBCUTANEOUS | 5 refills | Status: DC

## 2021-06-12 MED ORDER — SUTAB 1479-225-188 MG PO TABS: 1.0000 | ORAL_TABLET | ORAL | 0 refills | Status: DC

## 2021-06-12 MED ORDER — LEVOCETIRIZINE DIHYDROCHLORIDE 5 MG PO TABS: 5.0000 mg | ORAL_TABLET | Freq: Every evening | ORAL | 2 refills | Status: DC

## 2021-06-12 NOTE — Patient Instructions (Signed)
If you are age 65 or younger, your body mass index should be between 19-25. Your Body mass index is 39.87 kg/m?Marland Kitchen If this is out of the aformentioned range listed, please consider follow up with your Primary Care Provider.  ?________________________________________________________ ? ?The [place] [facility] providers would like to encourage you to use [facility] to communicate with providers for non-urgent requests or questions.  Due to long hold times on the telephone, sending your provider a message by [facility] may be a faster and more efficient way to get a response.  Please allow 48 business hours for a response.  Please remember that this is for non-urgent requests.  ?_______________________________________________________ ? ?You have been scheduled for an endoscopy and colonoscopy. Please follow the written instructions given to you at your visit today. ?Please pick up your prep supplies at the pharmacy within the next 1-3 days. ?If you use inhalers (even only as needed), please bring them with you on the day of your procedure. ? ?Your provider has requested that you go to the basement level for lab work before leaving today. Press "B" on the elevator. The lab is located at the first door on the left as you exit the elevator. ? ?Follow up pending the results of your Endoscopy/Colonoscopy. ? ?Thank you for entrusting me with your care and choosing [facility]. ? ?Dr Lorenso Courier ?

## 2021-06-12 NOTE — Telephone Encounter (Signed)
Prescription sent to pharmacy on file. 

## 2021-06-12 NOTE — Progress Notes (Signed)
? ?Chief Complaint: Anemia ? ?HPI : 66 year old female with history of DM, HFpEF, obesity, asthma presents with anemia ? ?Denies hematochezia or melena. She has not seen any sources of bleeding. Denies dysphagia, N&V, diarrhea, constipation. She is intentionally lost a few lbs recently. She does have acid reflux for which she takes Nexium on occasion. Has tried Pepcid in the past that was not adequate to keep her symptoms under control. Has fam hx of UC in sister. Mother has a hiatal hernia that she has had surgery for in the past. Denies prior colonoscopy or endoscopy. She had a Cologuard that was negative in 2020. She does have difficulty with a strong gag reflex. Denies blood thinners. Takes a baby aspirin daily. Denies NSAID use. Denies alcohol use. ? ?Past Medical History:  ?Diagnosis Date  ? Anemia   ? Arthritis   ? Asthma   ? Diabetes ([place])   ? Hyperlipidemia   ? Hypertension   ? Obesity   ? ?Past Surgical History:  ?Procedure Laterality Date  ? HERNIA REPAIR    ? OVARIAN CYST REMOVAL    ? ?Family History  ?Problem Relation Age of Onset  ? Heart disease Mother   ?     stent  ? Hypertension Mother   ? Stroke Mother   ? Diabetes Sister   ? Hypertension Sister   ? Ulcerative colitis Sister   ? Colon cancer Neg Hx   ? Stomach cancer Neg Hx   ? Esophageal cancer Neg Hx   ? ?Social History  ? ?Tobacco Use  ? Smoking status: Never  ? Smokeless tobacco: Never  ?Vaping Use  ? Vaping Use: Never used  ?Substance Use Topics  ? Alcohol use: Not Currently  ?  Comment: socially  ? Drug use: Never  ? ?Current Outpatient Medications  ?Medication Sig Dispense Refill  ? albuterol (ACCUNEB) 1.25 MG/3ML nebulizer solution Take 3 mLs (1.25 mg total) by nebulization every 6 (six) hours as needed for wheezing. 360 mL 2  ? albuterol (VENTOLIN HFA) 108 (90 Base) MCG/ACT inhaler Inhale 2 puffs into the lungs every 6 (six) hours as needed for wheezing or shortness of breath (Cough). 36 g 2  ? amLODipine (NORVASC) 10 MG tablet Take 1  tablet (10 mg total) by mouth daily. 90 tablet 3  ? aspirin EC 81 MG tablet Take 81 mg by mouth in the morning and at bedtime.     ? atorvastatin (LIPITOR) 40 MG tablet Take 1 tablet by mouth once daily 90 tablet 0  ? Cholecalciferol (VITAMIN D3) 5000 units CAPS Take by mouth daily.    ? diclofenac Sodium (VOLTAREN) 1 % GEL Apply 4 g topically 4 (four) times daily. 350 g 2  ? Dulaglutide (TRULICITY) 1.5 GE/9.5MW SOPN INJECT 1.5 MG INTO THE SKIN ONCE A WEEK 4 mL 5  ? fluticasone (FLONASE) 50 MCG/ACT nasal spray Place 2 sprays into both nostrils daily. 18 mL 2  ? Fluticasone Propionate, Inhal, (FLOVENT DISKUS) 250 MCG/ACT AEPB Inhale 1 puff into the lungs 2 (two) times daily after a meal. 1 each 5  ? Fluticasone-Umeclidin-Vilant (TRELEGY ELLIPTA) 200-62.5-25 MCG/ACT AEPB Inhale 1 puff into the lungs in the morning. 1 each 5  ? KRILL OIL PO Take by mouth daily.    ? levocetirizine (XYZAL ALLERGY 24HR) 5 MG tablet Take 1 tablet (5 mg total) by mouth every evening. 30 tablet 2  ? losartan-hydrochlorothiazide (HYZAAR) 100-12.5 MG tablet Take 1 tablet by mouth daily. 90 tablet 3  ?  metFORMIN (GLUCOPHAGE) 1000 MG tablet TAKE 1 TABLET BY MOUTH TWICE DAILY WITH A MEAL 180 tablet 1  ? montelukast (SINGULAIR) 10 MG tablet Take 1 tablet (10 mg total) by mouth at bedtime. 30 tablet 2  ? Multiple Vitamin (MULTIVITAMIN) tablet Take 1 tablet by mouth daily.    ? umeclidinium-vilanterol (ANORO ELLIPTA) 62.5-25 MCG/ACT AEPB Inhale 1 puff into the lungs daily at 6 (six) AM. 30 each 5  ? methylPREDNISolone (MEDROL DOSEPAK) 4 MG TBPK tablet Take 24 mg on day 1, 20 mg on day 2, 16 mg on day 3, 12 mg on day 4, 8 mg on day 5, 4 mg on day 6. (Patient not taking: Reported on 06/12/2021) 21 tablet 0  ? ?No current facility-administered medications for this visit.  ? ?No Known Allergies ? ?Review of Systems ?All systems reviewed and negative except where noted in HPI.  ? ?Physical Exam: ?BP 140/82   Pulse 92   Ht '5\' 2"'  (1.575 m)   Wt 218 lb  (98.9 kg)   SpO2 95%   BMI 39.87 kg/m?  ?Constitutional: Pleasant,well-developed, female in no acute distress. ?HEENT: Normocephalic and atraumatic. Conjunctivae are normal. No scleral icterus. ?Cardiovascular: Normal rate, regular rhythm.  ?Pulmonary/chest: Effort normal and breath sounds normal. No wheezing, rales or rhonchi. ?Abdominal: Soft, umbilical hernia present midline, non-tender.  ?Extremities: No edema ?Neurological: Alert and oriented to person place and time. ?Skin: Skin is warm and dry. No rashes noted. ?Psychiatric: Normal mood and affect. Behavior is normal. ? ?Labs 04/2021: CBC with low Hb of 10.7. CMP with mildly elevated alk phos of 123 and ALT of 59. ? ?CT A/P w/o contrast 10/04/20: ?IMPRESSION: ?Umbilical or supraumbilical ventral hernia containing a short ?portion of the mid transverse colon. No evidence of bowel ?obstruction. ?Colonic diverticulosis. ?Hepatic steatosis. ?Posterior right diaphragmatic hernia containing fat. ?Uterine fibroid. ? ?ASSESSMENT AND PLAN: ?Anemia ?Elevated LFTs ?GERD ?Colon cancer screening ?Patient presents with anemia that has been noted since 2021. Will plan to check her iron levels today. Will plan for EGD and colonoscopy for further evaluation since patient would be due for colon cancer screening, and she describes some issues with uncontrolled GERD depending on her dietary habits. Patient has also been noted to have elevated LFTs so will plan for a basic liver work up. It is likely that her elevated LFTs are due to fatty liver.  ?- GERD handout ?- Check CBC, INR, ferritin/TIBC, viral hepatitis panel, IgG, ANA, ASMA, AMA. Initial labs still show iron deficiency anemia. ?- EGD/colonoscopy [facility]. Will plan for Sutab prep. ? ?Christia Reading, MD ? ?

## 2021-06-14 LAB — ANA: Anti Nuclear Antibody (ANA): NEGATIVE

## 2021-06-18 LAB — HEPATITIS B SURFACE ANTIGEN: Hepatitis B Surface Ag: NONREACTIVE

## 2021-06-18 LAB — HEPATITIS B SURFACE ANTIBODY,QUALITATIVE: Hep B S Ab: NONREACTIVE

## 2021-06-18 LAB — MITOCHONDRIAL ANTIBODIES: Mitochondrial M2 Ab, IgG: <=20 U (ref <=20.0)

## 2021-06-18 LAB — ANTI-SMOOTH MUSCLE ANTIBODY, IGG: Actin (Smooth Muscle) Antibody (IGG): <20 U (ref <20)

## 2021-06-18 LAB — HEPATITIS A ANTIBODY, TOTAL: Hepatitis A AB,Total: NONREACTIVE

## 2021-06-18 LAB — HEPATITIS C ANTIBODY
Hepatitis C Ab: NONREACTIVE
SIGNAL TO CUT-OFF: 0.17 (ref <1.00)

## 2021-06-18 LAB — IGG: IgG (Immunoglobin G), Serum: 1525 mg/dL (ref 600–1540)

## 2021-06-19 ENCOUNTER — Telehealth: Payer: Self-pay | Admitting: Emergency Medicine

## 2021-06-19 NOTE — Telephone Encounter (Signed)
1.Medication Requested: ?montelukast (SINGULAIR) 10 MG tablet (Expired) ?2. Pharmacy (Name, Street, [place]): ?[facility], [place] Phone:  [phone]  ?Fax:  [phone]  ?  ? ?3. On Med List: yes ? ?4. Last Visit with PCP: ? ?5. Next visit date with PCP: ? ? ?Agent: Please be advised that RX refills may take up to 3 business days. We ask that you follow-up with your pharmacy.  ?

## 2021-06-20 MED ORDER — MONTELUKAST SODIUM 10 MG PO TABS: 10.0000 mg | ORAL_TABLET | Freq: Every day | ORAL | 2 refills | Status: DC

## 2021-07-15 ENCOUNTER — Other Ambulatory Visit: Payer: Self-pay | Admitting: Emergency Medicine

## 2021-07-15 DIAGNOSIS — I152 Hypertension secondary to endocrine disorders: Secondary | ICD-10-CM

## 2021-08-01 ENCOUNTER — Ambulatory Visit (AMBULATORY_SURGERY_CENTER): Payer: Commercial Managed Care - HMO | Admitting: Internal Medicine

## 2021-08-01 ENCOUNTER — Encounter: Payer: Self-pay | Admitting: Internal Medicine

## 2021-08-01 ENCOUNTER — Ambulatory Visit: Payer: Commercial Managed Care - HMO | Admitting: Emergency Medicine

## 2021-08-01 VITALS — BP 120/71 | HR 88 | Temp 96.9°F | Resp 16 | Ht 62.0 in | Wt 218.0 lb

## 2021-08-01 DIAGNOSIS — D649 Anemia, unspecified: Secondary | ICD-10-CM

## 2021-08-01 DIAGNOSIS — Z1211 Encounter for screening for malignant neoplasm of colon: Secondary | ICD-10-CM

## 2021-08-01 DIAGNOSIS — K219 Gastro-esophageal reflux disease without esophagitis: Secondary | ICD-10-CM

## 2021-08-01 DIAGNOSIS — K2970 Gastritis, unspecified, without bleeding: Secondary | ICD-10-CM

## 2021-08-01 DIAGNOSIS — D125 Benign neoplasm of sigmoid colon: Secondary | ICD-10-CM

## 2021-08-01 DIAGNOSIS — K635 Polyp of colon: Secondary | ICD-10-CM

## 2021-08-01 DIAGNOSIS — D509 Iron deficiency anemia, unspecified: Secondary | ICD-10-CM | POA: Diagnosis not present

## 2021-08-01 DIAGNOSIS — D124 Benign neoplasm of descending colon: Secondary | ICD-10-CM

## 2021-08-01 DIAGNOSIS — K259 Gastric ulcer, unspecified as acute or chronic, without hemorrhage or perforation: Secondary | ICD-10-CM | POA: Diagnosis not present

## 2021-08-01 MED ORDER — SODIUM CHLORIDE 0.9 % IV SOLN: 500.0000 mL | Freq: Once | INTRAVENOUS | Status: DC

## 2021-08-01 MED ORDER — OMEPRAZOLE 40 MG PO CPDR: 40.0000 mg | DELAYED_RELEASE_CAPSULE | Freq: Two times a day (BID) | ORAL | 3 refills | Status: DC

## 2021-08-01 NOTE — Patient Instructions (Addendum)
Handout on polyps, hemorrhoids, and diverticulosis provided   Await pathology results.   Continue current medications. Start Omeprazole 40 mg twice a day for 8 weeks.   No  ibuprofen, naproxen, or other non-steroidal anti-inflammatory drugs.  Return to GI clinic in 4 weeks. Appointment to be scheduled.    YOU HAD AN ENDOSCOPIC PROCEDURE TODAY AT [facility]:   Refer to the procedure report that was given to you for any specific questions about what was found during the examination.  If the procedure report does not answer your questions, please call your gastroenterologist to clarify.  If you requested that your care partner not be given the details of your procedure findings, then the procedure report has been included in a sealed envelope for you to review at your convenience later.  YOU SHOULD EXPECT: Some feelings of bloating in the abdomen. Passage of more gas than usual.  Walking can help get rid of the air that was put into your GI tract during the procedure and reduce the bloating. If you had a lower endoscopy (such as a colonoscopy or flexible sigmoidoscopy) you may notice spotting of blood in your stool or on the toilet paper. If you underwent a bowel prep for your procedure, you may not have a normal bowel movement for a few days.  Please Note:  You might notice some irritation and congestion in your nose or some drainage.  This is from the oxygen used during your procedure.  There is no need for concern and it should clear up in a day or so.  SYMPTOMS TO REPORT IMMEDIATELY:  Following lower endoscopy (colonoscopy or flexible sigmoidoscopy):  Excessive amounts of blood in the stool  Significant tenderness or worsening of abdominal pains  Swelling of the abdomen that is new, acute  Fever of 100F or higher  Following upper endoscopy (EGD)  Vomiting of blood or coffee ground material  New chest pain or pain under the shoulder blades  Painful or persistently  difficult swallowing  New shortness of breath  Fever of 100F or higher  Black, tarry-looking stools  For urgent or emergent issues, a gastroenterologist can be reached at any hour by calling [phone]. Do not use MyChart messaging for urgent concerns.    DIET:  We do recommend a small meal at first, but then you may proceed to your regular diet.  Drink plenty of fluids but you should avoid alcoholic beverages for 24 hours.  ACTIVITY:  You should plan to take it easy for the rest of today and you should NOT DRIVE or use heavy machinery until tomorrow (because of the sedation medicines used during the test).    FOLLOW UP: Our staff will call the number listed on your records the next business day following your procedure.  We will call around 7:15- 8:00 am to check on you and address any questions or concerns that you may have regarding the information given to you following your procedure. If we do not reach you, we will leave a message.  If you develop any symptoms (ie: fever, flu-like symptoms, shortness of breath, cough etc.) before then, please call [phone].  If you test positive for Covid 19 in the 2 weeks post procedure, please call and report this information to [place].    If any biopsies were taken you will be contacted by phone or by letter within the next 1-3 weeks.  Please call us at [phone] if you have not heard about the biopsies  in 3 weeks.    SIGNATURES/CONFIDENTIALITY: You and/or your care partner have signed paperwork which will be entered into your electronic medical record.  These signatures attest to the fact that that the information above on your After Visit Summary has been reviewed and is understood.  Full responsibility of the confidentiality of this discharge information lies with you and/or your care-partner.

## 2021-08-01 NOTE — Op Note (Signed)
[facility] Patient Name: Catherine Cline Procedure Date: 08/01/2021 7:31 AM MRN: [id number] Endoscopist: Sonny Masters "Catherine Cline ,  Age: 66 Referring MD:  Date of Birth: 08/05/1955 Gender: Female Account #: [id number] Procedure:                Upper GI endoscopy Indications:              Iron deficiency anemia, Heartburn Medicines:                Monitored Anesthesia Care Procedure:                Pre-Anesthesia Assessment:                           - Prior to the procedure, a History and Physical                            was performed, and patient medications and                            allergies were reviewed. The patient's tolerance of                            previous anesthesia was also reviewed. The risks                            and benefits of the procedure and the sedation                            options and risks were discussed with the patient.                            All questions were answered, and informed consent                            was obtained. Prior Anticoagulants: The patient has                            taken no previous anticoagulant or antiplatelet                            agents. ASA Grade Assessment: III - A patient with                            severe systemic disease. After reviewing the risks                            and benefits, the patient was deemed in                            satisfactory condition to undergo the procedure.                           After obtaining informed consent, the endoscope was  passed under direct vision. Throughout the                            procedure, the patient's blood pressure, pulse, and                            oxygen saturations were monitored continuously. The                            GIF [id number] #[id number] was introduced through the                            mouth, and advanced to the second part of duodenum.                            The upper GI  endoscopy was accomplished without                            difficulty. The patient tolerated the procedure                            well. Scope In: Scope Out: Findings:                 Salmon-colored mucosa was present. The maximum                            longitudinal extent of these esophageal mucosal                            changes was 1 cm in length. Mucosa was biopsied                            with a cold forceps for histology.                           A 4 cm hiatal hernia was present.                           Retained fluid was found in the gastric body.                           Localized inflammation characterized by congestion                            (edema), erosions and erythema was found in the                            gastric body and in the gastric antrum. There was                            overlying hematin from recent bleeding. Biopsies                            were taken with  a cold forceps for histology.                           The examined duodenum was normal. Biopsies were                            taken with a cold forceps for histology. Complications:            No immediate complications. Estimated Blood Loss:     Estimated blood loss was minimal. Impression:               - Salmon-colored mucosa. Biopsied.                           - 4 cm hiatal hernia.                           - Retained gastric fluid.                           - Gastritis. Biopsied.                           - Normal examined duodenum. Biopsied. Recommendation:           - Await pathology results.                           - Use Prilosec (omeprazole) 40 mg PO BID for 8                            weeks.                           - No ibuprofen, naproxen, or other non-steroidal                            anti-inflammatory drugs.                           - Perform a colonoscopy today. Sonny Masters "Catherine Cline,  08/01/2021 8:52:03 AM

## 2021-08-01 NOTE — Progress Notes (Signed)
Sedate, gd SR, tolerated procedure well, VSS, report to RN 

## 2021-08-01 NOTE — Op Note (Signed)
[place] Patient Name: Catherine Cline Procedure Date: 08/01/2021 7:31 AM MRN: [id number] Endoscopist: Sonny Masters "Catherine Cline ,  Age: 66 Referring MD:  Date of Birth: 1955-08-16 Gender: Female Account #: [id number] Procedure:                Colonoscopy Indications:              Screening for colorectal malignant neoplasm Medicines:                Monitored Anesthesia Care Procedure:                Pre-Anesthesia Assessment:                           - Prior to the procedure, a History and Physical                            was performed, and patient medications and                            allergies were reviewed. The patient's tolerance of                            previous anesthesia was also reviewed. The risks                            and benefits of the procedure and the sedation                            options and risks were discussed with the patient.                            All questions were answered, and informed consent                            was obtained. Prior Anticoagulants: The patient has                            taken no previous anticoagulant or antiplatelet                            agents. ASA Grade Assessment: III - A patient with                            severe systemic disease. After reviewing the risks                            and benefits, the patient was deemed in                            satisfactory condition to undergo the procedure.                           After obtaining informed consent, the colonoscope  was passed under direct vision. Throughout the                            procedure, the patient's blood pressure, pulse, and                            oxygen saturations were monitored continuously. The                            Olympus CF-HQ190L [id number]) Colonoscope was                            introduced through the anus and advanced to the the                            cecum,  identified by appendiceal orifice and                            ileocecal valve. The colonoscopy was performed                            without difficulty. The patient tolerated the                            procedure well. The quality of the bowel                            preparation was good. The ileocecal valve,                            appendiceal orifice, and rectum were photographed. Scope In: 8:25:15 AM Scope Out: 3:29:51 AM Scope Withdrawal Time: 0 hours 16 minutes 7 seconds  Total Procedure Duration: 0 hours 21 minutes 26 seconds  Findings:                 Multiple small and large-mouthed diverticula were                            found in the sigmoid colon, descending colon,                            transverse colon and ascending colon.                           Three sessile polyps were found in the sigmoid                            colon and descending colon. The polyps were 2 to 4                            mm in size. These polyps were removed with a cold                            snare. Resection and retrieval were complete.  Non-bleeding internal hemorrhoids were found during                            retroflexion. Complications:            No immediate complications. Estimated Blood Loss:     Estimated blood loss was minimal. Impression:               - Diverticulosis in the sigmoid colon, in the                            descending colon, in the transverse colon and in                            the ascending colon.                           - Three 2 to 4 mm polyps in the sigmoid colon and                            in the descending colon, removed with a cold snare.                            Resected and retrieved.                           - Non-bleeding internal hemorrhoids. Recommendation:           - Discharge patient to home (with escort).                           - Await pathology results.                           -  The findings and recommendations were discussed                            with the patient.                           - Return to GI clinic in 4 weeks. Sonny Masters "Catherine Cline,  08/01/2021 8:54:46 AM

## 2021-08-01 NOTE — Progress Notes (Signed)
Called to room to assist during endoscopic procedure.  Patient ID and intended procedure confirmed with present staff. Received instructions for my participation in the procedure from the performing physician.  

## 2021-08-01 NOTE — Progress Notes (Signed)
GASTROENTEROLOGY PROCEDURE H&P NOTE   Primary Care Physician: Horald Pollen, MD    Reason for Procedure:   IDA, colon cancer screening, GERD  Plan:    EGD/colonoscopy  Patient is appropriate for endoscopic procedure(s) in the ambulatory ([place]) setting.  The nature of the procedure, as well as the risks, benefits, and alternatives were carefully and thoroughly reviewed with the patient. Ample time for discussion and questions allowed. The patient understood, was satisfied, and agreed to proceed.     HPI: Catherine Cline is a 66 y.o. female who presents for EGD/colonoscopy for evaluation of IDA, colon cancer screening, and GERD .  Patient was most recently seen in the Gastroenterology Clinic on 06/12/21.  No interval change in medical history since that appointment. Please refer to that note for full details regarding GI history and clinical presentation.   Past Medical History:  Diagnosis Date   Anemia    Asthma    Diabetes ([place])    Hyperlipidemia    Hypertension    Obesity    osteoarthritis     Past Surgical History:  Procedure Laterality Date   COLONOSCOPY     HERNIA REPAIR     OVARIAN CYST REMOVAL     PARTIAL KNEE ARTHROPLASTY Left 2021   UPPER GASTROINTESTINAL ENDOSCOPY      Prior to Admission medications   Medication Sig Start Date End Date Taking? Authorizing Provider  albuterol (VENTOLIN HFA) 108 (90 Base) MCG/ACT inhaler Inhale 2 puffs into the lungs every 6 (six) hours as needed for wheezing or shortness of breath (Cough). 03/20/21 08/01/21 Yes Lynden Oxford Scales, PA-C  amLODipine (NORVASC) 10 MG tablet Take 1 tablet by mouth once daily 07/15/21  Yes Sagardia, Ines Bloomer, MD  aspirin EC 81 MG tablet Take 81 mg by mouth in the morning and at bedtime.    Yes [provider]  atorvastatin (LIPITOR) 40 MG tablet Take 1 tablet by mouth once daily 05/14/21  Yes Sagardia, Ines Bloomer, MD  Cholecalciferol (VITAMIN D3) 5000 units CAPS Take by mouth  daily.   Yes [provider]  Dulaglutide (TRULICITY) 1.5 PF/7.9KW SOPN Inject 1.5 mg into the skin once a week. 06/12/21  Yes Sagardia, Ines Bloomer, MD  fluticasone [facility] 50 MCG/ACT nasal spray Place 2 sprays into both nostrils daily. 03/20/21  Yes Lynden Oxford Scales, PA-C  Fluticasone Propionate, Inhal, (FLOVENT DISKUS) 250 MCG/ACT AEPB Inhale 1 puff into the lungs 2 (two) times daily after a meal. 03/20/21 09/16/21 Yes Lynden Oxford Scales, PA-C  levocetirizine (XYZAL ALLERGY 24HR) 5 MG tablet Take 1 tablet (5 mg total) by mouth every evening. 06/12/21 09/10/21 Yes Sagardia, Ines Bloomer, MD  losartan-hydrochlorothiazide [facility]) 100-12.5 MG tablet Take 1 tablet by mouth daily. 08/13/20  Yes Sagardia, Ines Bloomer, MD  metFORMIN (GLUCOPHAGE) 1000 MG tablet TAKE 1 TABLET BY MOUTH TWICE DAILY WITH A MEAL 02/13/21  Yes Sagardia, Ines Bloomer, MD  montelukast (SINGULAIR) 10 MG tablet Take 1 tablet (10 mg total) by mouth at bedtime. 06/20/21 09/18/21 Yes Sagardia, Ines Bloomer, MD  Multiple Vitamin (MULTIVITAMIN) tablet Take 1 tablet by mouth daily.   Yes [provider]  umeclidinium-vilanterol (ANORO ELLIPTA) 62.5-25 MCG/ACT AEPB Inhale 1 puff into the lungs daily at 6 (six) AM. 03/20/21 09/16/21 Yes Lynden Oxford Scales, PA-C  albuterol (ACCUNEB) 1.25 MG/3ML nebulizer solution Take 3 mLs (1.25 mg total) by nebulization every 6 (six) hours as needed for wheezing. 03/20/21 06/18/21  Lynden Oxford Scales, PA-C  KRILL OIL PO Take by  mouth daily.    [provider]    Current Outpatient Medications  Medication Sig Dispense Refill   albuterol (VENTOLIN HFA) 108 (90 Base) MCG/ACT inhaler Inhale 2 puffs into the lungs every 6 (six) hours as needed for wheezing or shortness of breath (Cough). 36 g 2   amLODipine (NORVASC) 10 MG tablet Take 1 tablet by mouth once daily 90 tablet 0   aspirin EC 81 MG tablet Take 81 mg by mouth in the morning and at bedtime.      atorvastatin (LIPITOR)  40 MG tablet Take 1 tablet by mouth once daily 90 tablet 0   Cholecalciferol (VITAMIN D3) 5000 units CAPS Take by mouth daily.     Dulaglutide (TRULICITY) 1.5 FA/2.1HY SOPN Inject 1.5 mg into the skin once a week. 4 mL 5   fluticasone (FLONASE) 50 MCG/ACT nasal spray Place 2 sprays into both nostrils daily. 18 mL 2   Fluticasone Propionate, Inhal, (FLOVENT DISKUS) 250 MCG/ACT AEPB Inhale 1 puff into the lungs 2 (two) times daily after a meal. 1 each 5   levocetirizine (XYZAL ALLERGY 24HR) 5 MG tablet Take 1 tablet (5 mg total) by mouth every evening. 30 tablet 2   losartan-hydrochlorothiazide (HYZAAR) 100-12.5 MG tablet Take 1 tablet by mouth daily. 90 tablet 3   metFORMIN (GLUCOPHAGE) 1000 MG tablet TAKE 1 TABLET BY MOUTH TWICE DAILY WITH A MEAL 180 tablet 1   montelukast (SINGULAIR) 10 MG tablet Take 1 tablet (10 mg total) by mouth at bedtime. 30 tablet 2   Multiple Vitamin (MULTIVITAMIN) tablet Take 1 tablet by mouth daily.     umeclidinium-vilanterol (ANORO ELLIPTA) 62.5-25 MCG/ACT AEPB Inhale 1 puff into the lungs daily at 6 (six) AM. 30 each 5   albuterol (ACCUNEB) 1.25 MG/3ML nebulizer solution Take 3 mLs (1.25 mg total) by nebulization every 6 (six) hours as needed for wheezing. 360 mL 2   KRILL OIL PO Take by mouth daily.     Current Facility-Administered Medications  Medication Dose Route Frequency Provider Last Rate Last Admin   0.9 %  sodium chloride infusion  500 mL Intravenous Once Sharyn Creamer, MD        Allergies as of 08/01/2021   (No Known Allergies)    Family History  Problem Relation Age of Onset   Heart disease Mother        stent   Hypertension Mother    Stroke Mother    Diabetes Sister    Hypertension Sister    Ulcerative colitis Sister    Colon cancer Neg Hx    Stomach cancer Neg Hx    Esophageal cancer Neg Hx    Rectal cancer Neg Hx     Social History   Socioeconomic History   Marital status: Single    Spouse name: Not on file   Number of  children: 0   Years of education: Not on file   Highest education level: Not on file  Occupational History   Occupation: Retired  Tobacco Use   Smoking status: Never   Smokeless tobacco: Never  Vaping Use   Vaping Use: Never used  Substance and Sexual Activity   Alcohol use: Not Currently    Comment: socially   Drug use: Never   Sexual activity: Not on file  Other Topics Concern   Not on file  Social History Narrative   Not on file   Social Determinants of Health   Financial Resource Strain: Not on file  Food Insecurity: Not  on file  Transportation Needs: Not on file  Physical Activity: Not on file  Stress: Not on file  Social Connections: Not on file  Intimate Partner Violence: Not on file    Physical Exam: Vital signs in last 24 hours: BP (!) 153/90   Pulse 93   Temp (!) 96.9 F (36.1 C) (Temporal)   Ht '5\' 2"'$  (1.575 m)   Wt 218 lb (98.9 kg)   SpO2 93%   BMI 39.87 kg/m  GEN: NAD EYE: Sclerae anicteric ENT: MMM CV: Non-tachycardic Pulm: No increased WOB GI: Soft NEURO:  Alert & Oriented   Christia Reading, MD [facility]   08/01/2021 8:08 AM

## 2021-08-02 ENCOUNTER — Telehealth: Payer: Self-pay

## 2021-08-02 NOTE — Telephone Encounter (Signed)
  Follow up Call-     08/01/2021    7:29 AM  Call back number  Post procedure Call Back phone  # [phone]  Permission to leave phone message Yes     Patient questions:  Do you have a fever, pain , or abdominal swelling? No. Pain Score  0 *  Have you tolerated food without any problems? Yes.    Have you been able to return to your normal activities? Yes.    Do you have any questions about your discharge instructions: Diet   No. Medications  No. Follow up visit  No.  Do you have questions or concerns about your Care? No.  Actions: * If pain score is 4 or above: No action needed, pain <4.

## 2021-08-03 ENCOUNTER — Other Ambulatory Visit: Payer: Self-pay | Admitting: Emergency Medicine

## 2021-08-03 DIAGNOSIS — E785 Hyperlipidemia, unspecified: Secondary | ICD-10-CM

## 2021-08-05 ENCOUNTER — Encounter: Payer: Self-pay | Admitting: Internal Medicine

## 2021-08-08 ENCOUNTER — Other Ambulatory Visit: Payer: Self-pay | Admitting: Emergency Medicine

## 2021-08-08 DIAGNOSIS — E785 Hyperlipidemia, unspecified: Secondary | ICD-10-CM

## 2021-08-09 ENCOUNTER — Other Ambulatory Visit (HOSPITAL_COMMUNITY): Payer: Self-pay

## 2021-08-10 ENCOUNTER — Other Ambulatory Visit: Payer: Self-pay | Admitting: Emergency Medicine

## 2021-08-10 DIAGNOSIS — I152 Hypertension secondary to endocrine disorders: Secondary | ICD-10-CM

## 2021-08-12 ENCOUNTER — Ambulatory Visit (INDEPENDENT_AMBULATORY_CARE_PROVIDER_SITE_OTHER): Payer: Commercial Managed Care - HMO | Admitting: Emergency Medicine

## 2021-08-12 ENCOUNTER — Encounter: Payer: Self-pay | Admitting: Emergency Medicine

## 2021-08-12 VITALS — BP 136/78 | HR 90 | Temp 98.1°F | Ht 62.0 in | Wt 213.1 lb

## 2021-08-12 DIAGNOSIS — E1169 Type 2 diabetes mellitus with other specified complication: Secondary | ICD-10-CM | POA: Diagnosis not present

## 2021-08-12 DIAGNOSIS — E1159 Type 2 diabetes mellitus with other circulatory complications: Secondary | ICD-10-CM

## 2021-08-12 DIAGNOSIS — D649 Anemia, unspecified: Secondary | ICD-10-CM

## 2021-08-12 DIAGNOSIS — E1165 Type 2 diabetes mellitus with hyperglycemia: Secondary | ICD-10-CM | POA: Diagnosis not present

## 2021-08-12 DIAGNOSIS — Z860100 Personal history of colon polyps, unspecified: Secondary | ICD-10-CM | POA: Insufficient documentation

## 2021-08-12 DIAGNOSIS — I152 Hypertension secondary to endocrine disorders: Secondary | ICD-10-CM

## 2021-08-12 DIAGNOSIS — K5790 Diverticulosis of intestine, part unspecified, without perforation or abscess without bleeding: Secondary | ICD-10-CM

## 2021-08-12 DIAGNOSIS — M2550 Pain in unspecified joint: Secondary | ICD-10-CM | POA: Diagnosis not present

## 2021-08-12 DIAGNOSIS — M159 Polyosteoarthritis, unspecified: Secondary | ICD-10-CM

## 2021-08-12 DIAGNOSIS — G8929 Other chronic pain: Secondary | ICD-10-CM

## 2021-08-12 DIAGNOSIS — E785 Hyperlipidemia, unspecified: Secondary | ICD-10-CM

## 2021-08-12 DIAGNOSIS — D509 Iron deficiency anemia, unspecified: Secondary | ICD-10-CM | POA: Insufficient documentation

## 2021-08-12 DIAGNOSIS — Z8709 Personal history of other diseases of the respiratory system: Secondary | ICD-10-CM

## 2021-08-12 LAB — POCT GLYCOSYLATED HEMOGLOBIN (HGB A1C): Hemoglobin A1C: 6.5 % — AB (ref 4.0–5.6)

## 2021-08-12 MED ORDER — FLUTICASONE PROPIONATE 50 MCG/ACT NA SUSP: 2.0000 | Freq: Every day | NASAL | 2 refills | Status: DC

## 2021-08-12 MED ORDER — TRELEGY ELLIPTA 100-62.5-25 MCG/ACT IN AEPB: 1.0000 | INHALATION_SPRAY | Freq: Every day | RESPIRATORY_TRACT | 11 refills | Status: DC

## 2021-08-12 MED ORDER — METFORMIN HCL 1000 MG PO TABS: 1000.0000 mg | ORAL_TABLET | Freq: Two times a day (BID) | ORAL | 3 refills | Status: DC

## 2021-08-12 MED ORDER — LEVOCETIRIZINE DIHYDROCHLORIDE 5 MG PO TABS: 5.0000 mg | ORAL_TABLET | Freq: Every evening | ORAL | 2 refills | Status: DC

## 2021-08-12 NOTE — Progress Notes (Signed)
Diagnosis 1. Surgical [P], duodenal - DUODENAL MUCOSA WITHIN NORMAL LIMITS. 2. Surgical [P], gastric antrum and gastric body - gastritis - ANTRAL AND OXYNTIC MUCOSA WITH EVIDENCE OF EROSION, CHEMICAL/REACTIVE/REPARATIVE CHANGE AND MILD CHRONIC INACTIVE GASTRITIS. - NO HELICOBACTER PYLORI ORGANISMS IDENTIFIED ON H&E STAINED SLIDE. 3. Surgical [P], GE junction - SQUAMOCOLUMNAR JUNCTIONAL MUCOSA WITH FEATURES SUGGESTIVE OF REFLUX. 4. Surgical [P], colon, sigmoid and descending, polyp (3) - HYPERPLASTIC POLYPS. Tilford Pillar MD Pathologist, Electronic Signature (Case signed 08/05/2021) Specimen  Catherine Cline 66 y.o.   Chief Complaint  Patient presents with   Follow-up    3 month f/u appt    Diabetes    No concerns     HISTORY OF PRESENT ILLNESS: This is a 66 y.o. female here for 52-monthfollow-up of hypertension and diabetes. Doing well, eating better, losing weight. Has no complaints or medical concerns today.  Diabetes Pertinent negatives for hypoglycemia include no dizziness or headaches. Pertinent negatives for diabetes include no chest pain.     Prior to Admission medications   Medication Sig Start Date End Date Taking? Authorizing Provider  amLODipine (NORVASC) 10 MG tablet Take 1 tablet by mouth once daily 07/15/21  Yes Shanty Ginty, MInes Bloomer MD  aspirin EC 81 MG tablet Take 81 mg by mouth in the morning and at bedtime.    Yes [provider]  atorvastatin (LIPITOR) 40 MG tablet Take 1 tablet by mouth once daily 08/04/21  Yes Donyae Kilner, MInes Bloomer MD  Cholecalciferol (VITAMIN D3) 5000 units CAPS Take by mouth daily.   Yes [provider]  Dulaglutide (TRULICITY) 1.5 MOE/4.2PNSOPN Inject 1.5 mg into the skin once a week. 06/12/21  Yes Yaxiel Minnie, MInes Bloomer MD  Fluticasone-Umeclidin-Vilant (TRELEGY ELLIPTA) 100-62.5-25 MCG/ACT AEPB Inhale 1 puff into the lungs daily. 08/12/21  Yes Gerrica Cygan, MInes Bloomer MD  KRILL OIL PO Take by mouth daily.   Yes  [provider]  losartan-hydrochlorothiazide (HYZAAR) 100-12.5 MG tablet Take 1 tablet by mouth once daily 08/11/21  Yes Joas Motton, MInes Bloomer MD  montelukast (SINGULAIR) 10 MG tablet Take 1 tablet (10 mg total) by mouth at bedtime. 06/20/21 09/18/21 Yes Layne Lebon, MInes Bloomer MD  Multiple Vitamin (MULTIVITAMIN) tablet Take 1 tablet by mouth daily.   Yes [provider]  omeprazole (PRILOSEC) 40 MG capsule Take 1 capsule (40 mg total) by mouth 2 (two) times daily. For 8 weeks 08/01/21  Yes DSharyn Creamer MD  albuterol (ACCUNEB) 1.25 MG/3ML nebulizer solution Take 3 mLs (1.25 mg total) by nebulization every 6 (six) hours as needed for wheezing. 03/20/21 06/18/21  MLynden OxfordScales, PA-C  albuterol (VENTOLIN HFA) 108 (90 Base) MCG/ACT inhaler Inhale 2 puffs into the lungs every 6 (six) hours as needed for wheezing or shortness of breath (Cough). 03/20/21 08/01/21  MLynden OxfordScales, PA-C  fluticasone (FLONASE) 50 MCG/ACT nasal spray Place 2 sprays into both nostrils daily. 08/12/21   SHorald Pollen MD  levocetirizine (XYZAL ALLERGY 24HR) 5 MG tablet Take 1 tablet (5 mg total) by mouth every evening. 08/12/21 11/10/21  SHorald Pollen MD  metFORMIN (GLUCOPHAGE) 1000 MG tablet Take 1 tablet (1,000 mg total) by mouth 2 (two) times daily with a meal. 08/12/21 08/07/22  SHorald Pollen MD    No Known Allergies  Patient Active Problem List   Diagnosis Date Noted   Diverticulosis 08/12/2021   History of colonic polyps 08/12/2021   Iron deficiency anemia 08/12/2021   Multiple allergies 05/01/2021   Chronic anemia 05/01/2021  Incisional hernia, without obstruction or gangrene 07/26/2020   Heart murmur 07/26/2020   Body mass index (BMI) of 40.1-44.9 in adult ([place]) 10/12/2019   Moderate persistent asthma with acute exacerbation 07/13/2019   Primary osteoarthritis involving multiple joints 07/14/2018   Chronic joint pain 07/14/2018   Dyslipidemia associated with  type 2 diabetes mellitus ([place]) 01/14/2018   Hypertension associated with diabetes ([place]) 07/07/2017   Hyperlipidemia 07/07/2017    Past Medical History:  Diagnosis Date   Anemia    Asthma    Diabetes ([place])    Hyperlipidemia    Hypertension    Obesity    osteoarthritis     Past Surgical History:  Procedure Laterality Date   COLONOSCOPY     HERNIA REPAIR     OVARIAN CYST REMOVAL     PARTIAL KNEE ARTHROPLASTY Left 2021   UPPER GASTROINTESTINAL ENDOSCOPY      Social History   Socioeconomic History   Marital status: Single    Spouse name: Not on file   Number of children: 0   Years of education: Not on file   Highest education level: Not on file  Occupational History   Occupation: Retired  Tobacco Use   Smoking status: Never   Smokeless tobacco: Never  Vaping Use   Vaping Use: Never used  Substance and Sexual Activity   Alcohol use: Not Currently    Comment: socially   Drug use: Never   Sexual activity: Not on file  Other Topics Concern   Not on file  Social History Narrative   Not on file   Social Determinants of Health   Financial Resource Strain: Not on file  Food Insecurity: Not on file  Transportation Needs: Not on file  Physical Activity: Not on file  Stress: Not on file  Social Connections: Not on file  Intimate Partner Violence: Not on file    Family History  Problem Relation Age of Onset   Heart disease Mother        stent   Hypertension Mother    Stroke Mother    Diabetes Sister    Hypertension Sister    Ulcerative colitis Sister    Colon cancer Neg Hx    Stomach cancer Neg Hx    Esophageal cancer Neg Hx    Rectal cancer Neg Hx      Review of Systems  Constitutional: Negative.  Negative for chills and fever.  HENT: Negative.  Negative for congestion and sore throat.   Respiratory: Negative.  Negative for cough and shortness of breath.   Cardiovascular: Negative.  Negative for chest pain and palpitations.  Gastrointestinal:   Negative for abdominal pain, diarrhea, nausea and vomiting.  Genitourinary: Negative.   Skin: Negative.  Negative for rash.  Neurological: Negative.  Negative for dizziness and headaches.  All other systems reviewed and are negative.  Today's Vitals   08/12/21 0837  BP: 136/78  Pulse: 90  Temp: 98.1 F (36.7 C)  TempSrc: Oral  SpO2: 92%  Weight: 213 lb 2 oz (96.7 kg)  Height: '5\' 2"'$  (1.575 m)   Body mass index is 38.98 kg/m.   Physical Exam Vitals reviewed.  Constitutional:      Appearance: Normal appearance.  HENT:     Head: Normocephalic.  Eyes:     Extraocular Movements: Extraocular movements intact.     Conjunctiva/sclera: Conjunctivae normal.     Pupils: Pupils are equal, round, and reactive to light.  Cardiovascular:     Rate and Rhythm: Normal rate  and regular rhythm.     Pulses: Normal pulses.     Heart sounds: Normal heart sounds.  Pulmonary:     Effort: Pulmonary effort is normal.     Breath sounds: Normal breath sounds.  Musculoskeletal:        General: Normal range of motion.     Cervical back: No tenderness.  Lymphadenopathy:     Cervical: No cervical adenopathy.  Skin:    General: Skin is warm and dry.     Capillary Refill: Capillary refill takes less than 2 seconds.  Neurological:     General: No focal deficit present.     Mental Status: She is alert and oriented to person, place, and time.  Psychiatric:        Mood and Affect: Mood normal.        Behavior: Behavior normal.   Results for orders placed or performed in visit on 08/12/21 (from the past 24 hour(s))  POCT HgB A1C     Status: Abnormal   Collection Time: 08/12/21  8:50 AM  Result Value Ref Range   Hemoglobin A1C 6.5 (A) 4.0 - 5.6 %   HbA1c POC (<> result, manual entry)     HbA1c, POC (prediabetic range)     HbA1c, POC (controlled diabetic range)       ASSESSMENT & PLAN: A total of 50 minutes was spent with the patient and counseling/coordination of care regarding preparing for  this visit, review of most recent office visit notes, review of most recent blood work results including today's hemoglobin A1c, review of multiple chronic medical problems and their management, review of all medications, cardiovascular risks associated with hypertension and diabetes, education on nutrition, review of health maintenance items including most recent upper and lower endoscopy reports and pathology reports, prognosis, documentation, need for follow-up  Problem List Items Addressed This Visit       Cardiovascular and Mediastinum   Hypertension associated with diabetes ([place]) - Primary    Well-controlled hypertension.  Continue Hyzaar 100-12.5 mg daily and amlodipine 10 mg daily. BP Readings from Last 3 Encounters:  08/12/21 136/78  08/01/21 120/71  06/12/21 140/82  Well-controlled diabetes with hemoglobin A1c of 6.5. Continue weekly Trulicity 1.5 mg and metformin 1000 mg twice a day. Cardiovascular risks associated with hypertension and diabetes discussed. Diet and nutrition discussed. Follow-up in 3 months.       Relevant Medications   metFORMIN (GLUCOPHAGE) 1000 MG tablet     Digestive   Diverticulosis    Advised to increase amount of fruits and vegetables in her diet. Symptoms of diverticulitis discussed.        Endocrine   Dyslipidemia associated with type 2 diabetes mellitus ([place])    Stable.  Diet and nutrition discussed.  Continue atorvastatin 40 mg daily.       Relevant Medications   metFORMIN (GLUCOPHAGE) 1000 MG tablet     Musculoskeletal and Integument   Primary osteoarthritis involving multiple joints    Stable and well-controlled. Advised to take Tylenol and avoid NSAIDs.        Other   Chronic joint pain   Chronic anemia    Recent upper and lower endoscopy reports reviewed with patient. No signs of malignancy.  Positive for diverticulosis. Continue daily iron supplementation.      History of colonic polyps   Iron deficiency anemia    Other Visit Diagnoses     Uncontrolled type 2 diabetes mellitus with hyperglycemia ([place])       Relevant  Medications   metFORMIN (GLUCOPHAGE) 1000 MG tablet   Other Relevant Orders   POCT HgB A1C (Completed)   History of asthma       Relevant Medications   Fluticasone-Umeclidin-Vilant (TRELEGY ELLIPTA) 100-62.5-25 MCG/ACT AEPB      Patient Instructions  Diabetes Mellitus and Nutrition, Adult When you have diabetes, or diabetes mellitus, it is very important to have healthy eating habits because your blood sugar (glucose) levels are greatly affected by what you eat and drink. Eating healthy foods in the right amounts, at about the same times every day, can help you: Manage your blood glucose. Lower your risk of heart disease. Improve your blood pressure. Reach or maintain a healthy weight. What can affect my meal plan? Every person with diabetes is different, and each person has different needs for a meal plan. Your health care provider may recommend that you work with a dietitian to make a meal plan that is best for you. Your meal plan may vary depending on factors such as: The calories you need. The medicines you take. Your weight. Your blood glucose, blood pressure, and cholesterol levels. Your activity level. Other health conditions you have, such as heart or kidney disease. How do carbohydrates affect me? Carbohydrates, also called carbs, affect your blood glucose level more than any other type of food. Eating carbs raises the amount of glucose in your blood. It is important to know how many carbs you can safely have in each meal. This is different for every person. Your dietitian can help you calculate how many carbs you should have at each meal and for each snack. How does alcohol affect me? Alcohol can cause a decrease in blood glucose (hypoglycemia), especially if you use insulin or take certain diabetes medicines by mouth. Hypoglycemia can be a life-threatening condition.  Symptoms of hypoglycemia, such as sleepiness, dizziness, and confusion, are similar to symptoms of having too much alcohol. Do not drink alcohol if: Your health care provider tells you not to drink. You are pregnant, may be pregnant, or are planning to become pregnant. If you drink alcohol: Limit how much you have to: 0-1 drink a day for women. 0-2 drinks a day for men. Know how much alcohol is in your drink. In the [place] one drink equals one 12 oz bottle of beer (355 mL), one 5 oz glass of wine (148 mL), or one 1 oz glass of hard liquor (44 mL). Keep yourself hydrated with water, diet soda, or unsweetened iced tea. Keep in mind that regular soda, juice, and other mixers may contain a lot of sugar and must be counted as carbs. What are tips for following this plan?  Reading food labels Start by checking the serving size on the Nutrition Facts label of packaged foods and drinks. The number of calories and the amount of carbs, fats, and other nutrients listed on the label are based on one serving of the item. Many items contain more than one serving per package. Check the total grams (g) of carbs in one serving. Check the number of grams of saturated fats and trans fats in one serving. Choose foods that have a low amount or none of these fats. Check the number of milligrams (mg) of salt (sodium) in one serving. Most people should limit total sodium intake to less than 2,300 mg per day. Always check the nutrition information of foods labeled as "low-fat" or "nonfat." These foods may be higher in added sugar or refined carbs and should be avoided.  Talk to your dietitian to identify your daily goals for nutrients listed on the label. Shopping Avoid buying canned, pre-made, or processed foods. These foods tend to be high in fat, sodium, and added sugar. Shop around the outside edge of the grocery store. This is where you will most often find fresh fruits and vegetables, bulk grains, fresh meats, and  fresh dairy products. Cooking Use low-heat cooking methods, such as baking, instead of high-heat cooking methods, such as deep frying. Cook using healthy oils, such as olive, canola, or sunflower oil. Avoid cooking with butter, cream, or high-fat meats. Meal planning Eat meals and snacks regularly, preferably at the same times every day. Avoid going long periods of time without eating. Eat foods that are high in fiber, such as fresh fruits, vegetables, beans, and whole grains. Eat 4-6 oz (112-168 g) of lean protein each day, such as lean meat, chicken, fish, eggs, or tofu. One ounce (oz) (28 g) of lean protein is equal to: 1 oz (28 g) of meat, chicken, or fish. 1 egg.  cup (62 g) of tofu. Eat some foods each day that contain healthy fats, such as avocado, nuts, seeds, and fish. What foods should I eat? Fruits Berries. Apples. Oranges. Peaches. Apricots. Plums. Grapes. Mangoes. Papayas. Pomegranates. Kiwi. Cherries. Vegetables Leafy greens, including lettuce, spinach, kale, chard, collard greens, mustard greens, and cabbage. Beets. Cauliflower. Broccoli. Carrots. Green beans. Tomatoes. Peppers. Onions. Cucumbers. Brussels sprouts. Grains Whole grains, such as whole-wheat or whole-grain bread, crackers, tortillas, cereal, and pasta. Unsweetened oatmeal. Quinoa. Brown or wild rice. Meats and other proteins Seafood. Poultry without skin. Lean cuts of poultry and beef. Tofu. Nuts. Seeds. Dairy Low-fat or fat-free dairy products such as milk, yogurt, and cheese. The items listed above may not be a complete list of foods and beverages you can eat and drink. Contact a dietitian for more information. What foods should I avoid? Fruits Fruits canned with syrup. Vegetables Canned vegetables. Frozen vegetables with butter or cream sauce. Grains Refined white flour and flour products such as bread, pasta, snack foods, and cereals. Avoid all processed foods. Meats and other proteins Fatty cuts of  meat. Poultry with skin. Breaded or fried meats. Processed meat. Avoid saturated fats. Dairy Full-fat yogurt, cheese, or milk. Beverages Sweetened drinks, such as soda or iced tea. The items listed above may not be a complete list of foods and beverages you should avoid. Contact a dietitian for more information. Questions to ask a health care provider Do I need to meet with a certified diabetes care and education specialist? Do I need to meet with a dietitian? What number can I call if I have questions? When are the best times to check my blood glucose? Where to find more information: American Diabetes Association: diabetes.org Academy of Nutrition and Dietetics: eatright.[employer] of Diabetes and Digestive and Kidney Diseases: [employer]is Association of Diabetes Care & Education Specialists: diabeteseducator.org Summary It is important to have healthy eating habits because your blood sugar (glucose) levels are greatly affected by what you eat and drink. It is important to use alcohol carefully. A healthy meal plan will help you manage your blood glucose and lower your risk of heart disease. Your health care provider may recommend that you work with a dietitian to make a meal plan that is best for you. This information is not intended to replace advice given to you by your health care provider. Make sure you discuss any questions you have with your health care provider. Document  Revised: 08/24/2019 Document Reviewed: 08/24/2019 Elsevier Patient Education  [place], MD [facility]

## 2021-08-12 NOTE — Assessment & Plan Note (Signed)
Advised to increase amount of fruits and vegetables in her diet. Symptoms of diverticulitis discussed.

## 2021-08-12 NOTE — Patient Instructions (Signed)

## 2021-08-12 NOTE — Assessment & Plan Note (Signed)
Stable and well-controlled. Advised to take Tylenol and avoid NSAIDs.

## 2021-08-12 NOTE — Assessment & Plan Note (Signed)
Recent upper and lower endoscopy reports reviewed with patient. No signs of malignancy.  Positive for diverticulosis. Continue daily iron supplementation.

## 2021-08-12 NOTE — Assessment & Plan Note (Signed)
Stable.  Diet and nutrition discussed. Continue atorvastatin 40 mg daily. 

## 2021-08-12 NOTE — Assessment & Plan Note (Signed)
Well-controlled hypertension.  Continue Hyzaar 100-12.5 mg daily and amlodipine 10 mg daily. BP Readings from Last 3 Encounters:  08/12/21 136/78  08/01/21 120/71  06/12/21 140/82  Well-controlled diabetes with hemoglobin A1c of 6.5. Continue weekly Trulicity 1.5 mg and metformin 1000 mg twice a day. Cardiovascular risks associated with hypertension and diabetes discussed. Diet and nutrition discussed. Follow-up in 3 months.

## 2021-08-27 ENCOUNTER — Encounter: Payer: Self-pay | Admitting: Internal Medicine

## 2021-09-02 ENCOUNTER — Other Ambulatory Visit: Payer: Self-pay | Admitting: Emergency Medicine

## 2021-09-18 ENCOUNTER — Other Ambulatory Visit: Payer: Self-pay | Admitting: Emergency Medicine

## 2021-10-01 ENCOUNTER — Other Ambulatory Visit: Payer: Self-pay | Admitting: Emergency Medicine

## 2021-10-02 ENCOUNTER — Ambulatory Visit: Payer: Self-pay | Admitting: General Surgery

## 2021-10-02 DIAGNOSIS — K430 Incisional hernia with obstruction, without gangrene: Secondary | ICD-10-CM | POA: Diagnosis not present

## 2021-10-02 NOTE — H&P (Signed)
Chief Complaint: Follow-up       History of Present Illness: Catherine Cline is a 66 y.o. female who is seen today as an office consultation at the request of Dr. Pasty Arch for evaluation of Follow-up .   Patient is a 66 year old female who follows back up today secondary to a recurrent incisional hernia. Previously patient did undergo CT scan.  This is approximately 6 to 7 cm incisional hernia.  There appears to be small bowel and colon within the hernia.   Patient states that she was not able to schedule surgery last year secondary to insurance issues.   She states that she feels that the hernia is gotten larger.  She is conscious about the bulge.  She had no signs or symptoms of incarceration or strangulation.       Review of Systems: A complete review of systems was obtained from the patient.  I have reviewed this information and discussed as appropriate with the patient.  See HPI as well for other ROS.   Review of Systems  Constitutional: Negative.   HENT: Negative.    Eyes: Negative.   Respiratory: Negative.    Cardiovascular: Negative.   Gastrointestinal: Negative.   Genitourinary: Negative.   Musculoskeletal: Negative.   Skin: Negative.   Neurological: Negative.   Endo/Heme/Allergies: Negative.   Psychiatric/Behavioral: Negative.         Medical History: Past Medical History History reviewed. No pertinent past medical history.    There is no problem list on file for this patient.     Past Surgical History Past Surgical History: Procedure Laterality Date  HERNIA REPAIR   08/04/2019  JOINT REPLACEMENT Left     knee  OOPHORECTOMY       age 14      Allergies No Known Allergies    Current Outpatient Medications on File Prior to Visit Medication Sig Dispense Refill  albuterol 90 mcg/actuation inhaler Inhale into the lungs      amLODIPine (NORVASC) 10 MG tablet Take 1 tablet by mouth once daily      aspirin 81 MG EC tablet Take by mouth      atorvastatin  (LIPITOR) 40 MG tablet Take 40 mg by mouth once daily      budesonide-formoteroL (SYMBICORT) 80-4.5 mcg/actuation inhaler Inhale into the lungs      dulaglutide (TRULICITY) 1.5 KG/8.1 mL subcutaneous injection Inject subcutaneously      losartan-hydrochlorothiazide (HYZAAR) 100-12.5 mg tablet Take 1 tablet by mouth once daily      metFORMIN (GLUCOPHAGE) 1000 MG tablet Take by mouth       No current facility-administered medications on file prior to visit.     Family History History reviewed. No pertinent family history.     Social History   Tobacco Use Smoking Status Never Smokeless Tobacco Never     Social History Social History    Socioeconomic History  Marital status: Unknown Tobacco Use  Smoking status: Never  Smokeless tobacco: Never Vaping Use  Vaping Use: Never used Substance and Sexual Activity  Alcohol use: Never  Drug use: Never      Objective:     There were no vitals filed for this visit.  There is no height or weight on file to calculate BMI.   Physical Exam Abdominal:         Hernia Size: 6 cm Incarcerated: Yes Recurrent Hernia   Assessment and Plan: Diagnoses and all orders for this visit:   Recurrent incisional hernia with incarceration  Catherine Cline is a 66 y.o. female    1.  We will proceed to the OR for a robotic versus open incisional hernia repair with mesh. 2. All risks and benefits were discussed with the patient, to generally include infection, bleeding, damage to surrounding structures, acute and chronic nerve pain, and recurrence. Alternatives were offered and described.  All questions were answered and the patient voiced understanding of the procedure and wishes to proceed at this point.             No follow-ups on file.   Ralene Ok, MD, [facility], [facility]

## 2021-10-04 ENCOUNTER — Other Ambulatory Visit: Payer: Self-pay | Admitting: Emergency Medicine

## 2021-10-04 DIAGNOSIS — I152 Hypertension secondary to endocrine disorders: Secondary | ICD-10-CM

## 2021-10-29 ENCOUNTER — Other Ambulatory Visit: Payer: Self-pay | Admitting: Emergency Medicine

## 2021-10-30 ENCOUNTER — Encounter: Payer: Self-pay | Admitting: Internal Medicine

## 2021-10-30 ENCOUNTER — Ambulatory Visit: Payer: Medicare Other | Attending: Internal Medicine | Admitting: Internal Medicine

## 2021-10-30 ENCOUNTER — Other Ambulatory Visit: Payer: Self-pay | Admitting: Emergency Medicine

## 2021-10-30 VITALS — BP 118/74 | HR 87 | Ht 62.0 in | Wt 214.6 lb

## 2021-10-30 DIAGNOSIS — J4540 Moderate persistent asthma, uncomplicated: Secondary | ICD-10-CM

## 2021-10-30 DIAGNOSIS — E785 Hyperlipidemia, unspecified: Secondary | ICD-10-CM

## 2021-10-30 DIAGNOSIS — I152 Hypertension secondary to endocrine disorders: Secondary | ICD-10-CM

## 2021-10-30 DIAGNOSIS — E1159 Type 2 diabetes mellitus with other circulatory complications: Secondary | ICD-10-CM

## 2021-10-30 DIAGNOSIS — Z01810 Encounter for preprocedural cardiovascular examination: Secondary | ICD-10-CM

## 2021-10-30 DIAGNOSIS — I447 Left bundle-branch block, unspecified: Secondary | ICD-10-CM

## 2021-10-30 DIAGNOSIS — K432 Incisional hernia without obstruction or gangrene: Secondary | ICD-10-CM

## 2021-10-30 DIAGNOSIS — R011 Cardiac murmur, unspecified: Secondary | ICD-10-CM | POA: Diagnosis not present

## 2021-10-30 DIAGNOSIS — R0609 Other forms of dyspnea: Secondary | ICD-10-CM

## 2021-10-30 NOTE — Patient Instructions (Signed)
Medication Instructions:  No Changes In Medications at this time.  *If you need a refill on your cardiac medications before your next appointment, please call your pharmacy*  Follow-Up: At [facility], you and your health needs are our priority.  As part of our continuing mission to provide you with exceptional heart care, we have created designated Provider Care Teams.  These Care Teams include your primary Cardiologist (physician) and Advanced Practice Providers (APPs -  Physician Assistants and Nurse Practitioners) who all work together to provide you with the care you need, when you need it.  Your next appointment:   6 month(s)  The format for your next appointment:   In Person  Provider:   Gayatri A Acharya, MD          

## 2021-10-30 NOTE — Progress Notes (Unsigned)
Cardiology Office Note:    Date:  10/30/2021   ID:  Catherine Cline, DOB 1955-08-10, MRN [id number]  PCP:  Horald Pollen, MD  Cardiologist:  Elouise Munroe, MD  Electrophysiologist:  None   Referring MD: Horald Pollen, *   Chief Complaint/Reason for Referral: Heart Murmur, dyspnea on exertion  History of Present Illness:    Catherine Cline is a 66 y.o. female with a history of arthritis, asthma, diabetes, and hypertension here for f/u.  Doing really well. *** Has had weight loss. Taking care of her mother.  The patient denies chest pain, chest pressure, dyspnea at rest or with exertion, palpitations, PND, orthopnea, or leg swelling. Denies cough, fever, chills. Denies nausea, vomiting. Denies syncope or presyncope. Denies dizziness or lightheadedness. Possible snoring.   Last visit: Still awaiting umbilical hernia surgery. Scans planned for this week. Remains somewhat short of breath with activity.  We discussed reassuring results of echocardiogram with no valvular heart disease to contribute to worrisome source of murmur, and normal nuclear stress test with apical anterior artifact likely from breast attenuation.  We discussed that nuclear stress test with artifact, in the setting of persistent or worsening symptoms, should be further investigated for ischemia, however at this time results appear to be low risk.  We discussed possibility of deconditioning and I have encouraged her with these reassuring stress test and echo results to increase her activity level as tolerated.  The patient denies chest pain, chest pressure, dyspnea at rest, palpitations, PND, orthopnea, or leg swelling. Denies cough, fever, chills. Denies nausea, vomiting. Denies syncope or presyncope. Denies dizziness or lightheadedness.     Past Medical History:  Diagnosis Date   Anemia    Asthma    Diabetes ([place])    Hyperlipidemia    Hypertension    Obesity    osteoarthritis     Past  Surgical History:  Procedure Laterality Date   COLONOSCOPY     HERNIA REPAIR     OVARIAN CYST REMOVAL     PARTIAL KNEE ARTHROPLASTY Left 2021   UPPER GASTROINTESTINAL ENDOSCOPY      Current Medications: Current Meds  Medication Sig   albuterol (ACCUNEB) 1.25 MG/3ML nebulizer solution Take 3 mLs (1.25 mg total) by nebulization every 6 (six) hours as needed for wheezing.   albuterol (VENTOLIN HFA) 108 (90 Base) MCG/ACT inhaler Inhale 2 puffs into the lungs every 6 (six) hours as needed for wheezing or shortness of breath (Cough).   amLODipine (NORVASC) 10 MG tablet Take 1 tablet by mouth once daily   aspirin EC 81 MG tablet Take 81 mg by mouth in the morning and at bedtime.    atorvastatin (LIPITOR) 40 MG tablet Take 1 tablet by mouth once daily   Cholecalciferol (VITAMIN D3) 5000 units CAPS Take by mouth daily.   Dulaglutide (TRULICITY) 1.5 EP/3.2RJ SOPN INJECT 1.5 MG SUBCUTANEOUSLY ONCE A WEEK   fluticasone (FLONASE) 50 MCG/ACT nasal spray Place 2 sprays into both nostrils daily.   Fluticasone-Umeclidin-Vilant (TRELEGY ELLIPTA) 100-62.5-25 MCG/ACT AEPB Inhale 1 puff into the lungs daily.   KRILL OIL PO Take by mouth daily.   levocetirizine (XYZAL ALLERGY 24HR) 5 MG tablet Take 1 tablet (5 mg total) by mouth every evening.   losartan-hydrochlorothiazide (HYZAAR) 100-12.5 MG tablet Take 1 tablet by mouth once daily   metFORMIN (GLUCOPHAGE) 1000 MG tablet Take 1 tablet (1,000 mg total) by mouth 2 (two) times daily with a meal.   montelukast (SINGULAIR) 10 MG tablet TAKE  1 TABLET BY MOUTH AT BEDTIME   Multiple Vitamin (MULTIVITAMIN) tablet Take 1 tablet by mouth daily.   omeprazole (PRILOSEC) 40 MG capsule Take 1 capsule (40 mg total) by mouth 2 (two) times daily. For 8 weeks     Allergies:   Patient has no known allergies.   Social History   Tobacco Use   Smoking status: Never   Smokeless tobacco: Never  Vaping Use   Vaping Use: Never used  Substance Use Topics   Alcohol use: Not  Currently    Comment: socially   Drug use: Never     Family History: The patient's family history includes Diabetes in her sister; Heart disease in her mother; Hypertension in her mother and sister; Stroke in her mother; Ulcerative colitis in her sister. There is no history of Colon cancer, Stomach cancer, Esophageal cancer, or Rectal cancer.  ROS:   Please see the history of present illness.    (+) Chest pressure/tightness during asthma attack (+) Shortness of breath (+) Heart "fluttering" (+) Wheezing (+) Joint pain All other systems reviewed and are negative.  EKGs/Labs/Other Studies Reviewed:    The following studies were reviewed today: No prior CV studies available.  EKG: 10/30/21: NSR, nonspecific ST abnl. QRS duration 82 ms, no LBBB  07/31/2020: Sinus rhythm, LBBB, rate 90 bpm, QRS duration of 140 ms  Recent Labs: 05/01/2021: ALT 59; BUN 23; Creatinine, Ser 0.87; Potassium 4.0; Sodium 139 06/12/2021: Hemoglobin 11.5; Platelets 392.0  Recent Lipid Panel    Component Value Date/Time   CHOL 129 05/01/2021 0933   CHOL 149 10/12/2019 0941   TRIG 133.0 05/01/2021 0933   HDL 41.30 05/01/2021 0933   HDL 46 10/12/2019 0941   CHOLHDL 3 05/01/2021 0933   VLDL 26.6 05/01/2021 0933   LDLCALC 61 05/01/2021 0933   LDLCALC 80 10/12/2019 0941    Physical Exam:    VS:  BP 118/74   Pulse 87   Ht '5\' 2"'$  (1.575 m)   Wt 214 lb 9.6 oz (97.3 kg)   SpO2 97%   BMI 39.25 kg/m     Wt Readings from Last 5 Encounters:  10/30/21 214 lb 9.6 oz (97.3 kg)  08/12/21 213 lb 2 oz (96.7 kg)  08/01/21 218 lb (98.9 kg)  06/12/21 218 lb (98.9 kg)  05/01/21 220 lb 4 oz (99.9 kg)    Constitutional: No acute distress Eyes: sclera non-icteric, normal conjunctiva and lids ENMT: normal dentition, moist mucous membranes Cardiovascular: regular rhythm, normal rate, faint systolic murmur at LUSB. S1 and S2 normal. Radial pulses normal bilaterally. No jugular venous distention.  Respiratory: clear to  auscultation bilaterally GI : normal bowel sounds, soft and nontender. No distention.   MSK: extremities warm, well perfused. No edema.  NEURO: grossly nonfocal exam, moves all extremities. PSYCH: alert and oriented x 3, normal mood and affect.   ASSESSMENT:    No diagnosis found.   PLAN:    Dyspnea on exertion -  Left bundle branch block - - no worrisome findings on nuclear stress test or echocardiogram.  No signs of prior infarct to contribute to left bundle branch block.  I have asked her to increase her activity level as tolerated and if symptoms do not improve or worsen, we will consider alternate testing for evaluation of ischemia.  Exercise recommendations as below.  Hypertension associated with diabetes (HCC)-blood pressure with reasonably good control today, continue amlodipine, losartan HCTZ.  We discussed that it is possible that if blood pressure rises with  activity this could contribute to shortness of breath as well and we could consider additional low-dose diuretic therapy such as furosemide or spironolactone if symptoms continue.  We will follow.  Hyperlipidemia, unspecified hyperlipidemia type-she has diabetes and is on Lipitor 40 mg daily, would continue.  Last LDL is 80, this is quite near her goal.  Heart murmur-faint systolic murmur in the setting of no valvular heart disease.  Suspect flow murmur.  If she is anemic at her next laboratory study check, this is most likely the cause.  Moderate persistent asthma with acute exacerbation-overall stable on albuterol and Symbicort.  Exercise recommendations: Goal of exercising for at least 30 minutes a day, at least 5 times per week.  Please exercise to a moderate exertion.  This means that while exercising it is difficult to speak in full sentences, however you are not so short of breath that you feel you must stop, and not so comfortable that you can carry on a full conversation.  Exertion level should be approximately a  5/10, if 10 is the most exertion you can perform.  Diet recommendations: Recommend a heart healthy diet such as the Mediterranean diet.  This diet consists of plant based foods, healthy fats, lean meats, olive oil.  It suggests limiting the intake of simple carbohydrates such as white breads, pastries, and pastas.  It also limits the amount of red meat, wine, and dairy products such as cheese that one should consume on a daily basis.  Hernia Preop CV exam - The patient is low-intermediate risk for intermediate risk procedure.  No further cardiovascular testing is required prior to the procedure.  Level of risk felt to be nonmodifiable at this time if this level of risk is acceptable to the patient and surgical team, the patient should be considered optimized from a cardiovascular standpoint.  Patient understands risk categorization and is willing to proceed.  Total time of encounter: 30 minutes total time of encounter, including 25 minutes spent in face-to-face patient care on the date of this encounter. This time includes coordination of care and counseling regarding above mentioned problem list. Remainder of non-face-to-face time involved reviewing chart documents/testing relevant to the patient encounter and documentation in the medical record. I have independently reviewed documentation from referring provider.   Cherlynn Kaiser, MD, [place]    Shared Decision Making/Informed Consent:     Medication Adjustments/Labs and Tests Ordered: Current medicines are reviewed at length with the patient today.  Concerns regarding medicines are outlined above.   Orders Placed This Encounter  Procedures   EKG 12-Lead     No orders of the defined types were placed in this encounter.    Patient Instructions  Medication Instructions:  No Changes In Medications at this time.  *If you need a refill on your cardiac medications before your next appointment, please call  your pharmacy*  Follow-Up: At [facility], you and your health needs are our priority.  As part of our continuing mission to provide you with exceptional heart care, we have created designated Provider Care Teams.  These Care Teams include your primary Cardiologist (physician) and Advanced Practice Providers (APPs -  Physician Assistants and Nurse Practitioners) who all work together to provide you with the care you need, when you need it.  Your next appointment:   6 month(s)  The format for your next appointment:   In Person  Provider:   Elouise Munroe, MD

## 2021-10-30 NOTE — Progress Notes (Deleted)
Ekg

## 2021-11-01 ENCOUNTER — Other Ambulatory Visit: Payer: Self-pay | Admitting: Emergency Medicine

## 2021-11-01 DIAGNOSIS — E785 Hyperlipidemia, unspecified: Secondary | ICD-10-CM

## 2021-11-01 DIAGNOSIS — E1159 Type 2 diabetes mellitus with other circulatory complications: Secondary | ICD-10-CM

## 2021-11-06 ENCOUNTER — Encounter: Payer: Self-pay | Admitting: Emergency Medicine

## 2021-11-06 ENCOUNTER — Ambulatory Visit (INDEPENDENT_AMBULATORY_CARE_PROVIDER_SITE_OTHER): Payer: Medicare Other | Admitting: Emergency Medicine

## 2021-11-06 VITALS — BP 138/78 | HR 86 | Temp 98.0°F | Ht 62.0 in | Wt 213.4 lb

## 2021-11-06 DIAGNOSIS — E1169 Type 2 diabetes mellitus with other specified complication: Secondary | ICD-10-CM

## 2021-11-06 DIAGNOSIS — Z23 Encounter for immunization: Secondary | ICD-10-CM

## 2021-11-06 DIAGNOSIS — E1159 Type 2 diabetes mellitus with other circulatory complications: Secondary | ICD-10-CM | POA: Diagnosis not present

## 2021-11-06 DIAGNOSIS — E785 Hyperlipidemia, unspecified: Secondary | ICD-10-CM | POA: Diagnosis not present

## 2021-11-06 DIAGNOSIS — I152 Hypertension secondary to endocrine disorders: Secondary | ICD-10-CM

## 2021-11-06 LAB — POCT GLYCOSYLATED HEMOGLOBIN (HGB A1C): Hemoglobin A1C: 6.7 % — AB (ref 4.0–5.6)

## 2021-11-06 NOTE — Assessment & Plan Note (Signed)
Well-controlled hypertension. Continue Hyzaar 100-12.5 mg and amlodipine 10 mg daily. Well-controlled diabetes with hemoglobin A1c of 6.7. Continue weekly Trulicity 1.5 mg and metformin 1000 mg twice a day Cardiovascular risks associated with diabetes and hypertension discussed. Diet and nutrition discussed. Follow-up in 3 months.

## 2021-11-06 NOTE — Patient Instructions (Signed)

## 2021-11-06 NOTE — Assessment & Plan Note (Signed)
Stable.  Diet and nutrition discussed. Continue atorvastatin 40 mg daily. 

## 2021-11-06 NOTE — Progress Notes (Signed)
Catherine Cline 66 y.o.   Chief Complaint  Patient presents with   Follow-up    3 mnth f/u appt , no concern    HISTORY OF PRESENT ILLNESS: This is a 66 y.o. female with history of hypertension, diabetes and dyslipidemia here for 42-monthfollow-up. Overall doing well.  Has no complaints or medical concerns today. Lab Results  Component Value Date   HGBA1C 6.5 (A) 08/12/2021     HPI   Prior to Admission medications   Medication Sig Start Date End Date Taking? Authorizing Provider  amLODipine (NORVASC) 10 MG tablet Take 1 tablet by mouth once daily 10/04/21  Yes Talicia Sui, MInes Bloomer MD  Ascorbic Acid (VITAMIN C) 1000 MG tablet Take 1,000 mg by mouth daily.   Yes [provider]  aspirin EC 81 MG tablet Take 81 mg by mouth in the morning and at bedtime.    Yes [provider]  atorvastatin (LIPITOR) 40 MG tablet Take 1 tablet by mouth once daily 10/30/21  Yes Story Conti, MInes Bloomer MD  Cholecalciferol (VITAMIN D3) 5000 units CAPS Take by mouth daily.   Yes [provider]  Dulaglutide (TRULICITY) 1.5 MAJ/2.8NOSOPN INJECT 1.5 MG SUBCUTANEOUSLY ONCE A WEEK 09/18/21  Yes Ramir Malerba, MInes Bloomer MD  fluticasone ([facility] 50 MCG/ACT nasal spray Place 2 sprays into both nostrils daily. 08/12/21  Yes Jassiel Flye, MInes Bloomer MD  Fluticasone-Umeclidin-Vilant (TRELEGY ELLIPTA) 100-62.5-25 MCG/ACT AEPB Inhale 1 puff into the lungs daily. 08/12/21  Yes Jane Broughton, MInes Bloomer MD  KRILL OIL PO Take by mouth daily.   Yes [provider]  levocetirizine (XYZAL ALLERGY 24HR) 5 MG tablet Take 1 tablet (5 mg total) by mouth every evening. 08/12/21 11/10/21 Yes Kamara Allan, MInes Bloomer MD  losartan-hydrochlorothiazide ([facility] 100-12.5 MG tablet Take 1 tablet by mouth once daily 11/02/21  Yes Stevana Dufner, MInes Bloomer MD  metFORMIN (GLUCOPHAGE) 1000 MG tablet Take 1 tablet (1,000 mg total) by mouth 2 (two) times daily with a meal. 08/12/21 08/07/22 Yes Taylin Mans, MInes Bloomer MD   montelukast (SINGULAIR) 10 MG tablet TAKE 1 TABLET BY MOUTH AT BEDTIME 10/29/21  Yes Kihanna Kamiya, MInes Bloomer MD  Multiple Vitamin (MULTIVITAMIN) tablet Take 1 tablet by mouth daily.   Yes [provider]  omeprazole (PRILOSEC) 40 MG capsule Take 1 capsule (40 mg total) by mouth 2 (two) times daily. For 8 weeks 08/01/21  Yes DSharyn Creamer MD  albuterol (ACCUNEB) 1.25 MG/3ML nebulizer solution Take 3 mLs (1.25 mg total) by nebulization every 6 (six) hours as needed for wheezing. 03/20/21 10/30/21  MLynden OxfordScales, PA-C  albuterol (VENTOLIN HFA) 108 (90 Base) MCG/ACT inhaler Inhale 2 puffs into the lungs every 6 (six) hours as needed for wheezing or shortness of breath (Cough). 03/20/21 10/30/21  MLynden OxfordScales, PA-C    No Known Allergies  Patient Active Problem List   Diagnosis Date Noted   Diverticulosis 08/12/2021   History of colonic polyps 08/12/2021   Iron deficiency anemia 08/12/2021   Multiple allergies 05/01/2021   Chronic anemia 05/01/2021   Incisional hernia, without obstruction or gangrene 07/26/2020   Heart murmur 07/26/2020   Body mass index (BMI) of 40.1-44.9 in adult ([place] 10/12/2019   Moderate persistent asthma with acute exacerbation 07/13/2019   Primary osteoarthritis involving multiple joints 07/14/2018   Chronic joint pain 07/14/2018   Dys[place] with type 2 diabetes mellitus ([place] 01/14/2018   Hypertension associated with diabetes ([place] 07/07/2017   Hyperlipidemia 07/07/2017    Past Medical History:  Diagnosis Date  Anemia    Asthma    Diabetes ([place])    Hyperlipidemia    Hypertension    Obesity    osteoarthritis     Past Surgical History:  Procedure Laterality Date   COLONOSCOPY     HERNIA REPAIR     OVARIAN CYST REMOVAL     PARTIAL KNEE ARTHROPLASTY Left 2021   UPPER GASTROINTESTINAL ENDOSCOPY      Social History   Socioeconomic History   Marital status: Single    Spouse name: Not on file   Number of children:  0   Years of education: Not on file   Highest education level: Not on file  Occupational History   Occupation: Retired  Tobacco Use   Smoking status: Never   Smokeless tobacco: Never  Vaping Use   Vaping Use: Never used  Substance and Sexual Activity   Alcohol use: Not Currently    Comment: socially   Drug use: Never   Sexual activity: Not on file  Other Topics Concern   Not on file  Social History Narrative   Not on file   Social Determinants of Health   Financial Resource Strain: Not on file  Food Insecurity: Not on file  Transportation Needs: Not on file  Physical Activity: Not on file  Stress: Not on file  Social Connections: Not on file  Intimate Partner Violence: Not on file    Family History  Problem Relation Age of Onset   Heart disease Mother        stent   Hypertension Mother    Stroke Mother    Diabetes Sister    Hypertension Sister    Ulcerative colitis Sister    Colon cancer Neg Hx    Stomach cancer Neg Hx    Esophageal cancer Neg Hx    Rectal cancer Neg Hx      Review of Systems  Constitutional: Negative.  Negative for chills and fever.  HENT: Negative.  Negative for congestion and sore throat.   Respiratory: Negative.  Negative for cough and shortness of breath.   Cardiovascular: Negative.  Negative for palpitations.  Gastrointestinal:  Negative for abdominal pain, nausea and vomiting.  Genitourinary: Negative.   Skin: Negative.  Negative for rash.  Neurological:  Negative for dizziness and headaches.  All other systems reviewed and are negative.   Today's Vitals   11/06/21 0822  BP: 138/78  Pulse: 86  Temp: 98 F (36.7 C)  TempSrc: Oral  SpO2: 92%  Weight: 213 lb 6 oz (96.8 kg)  Height: '5\' 2"'$  (1.575 m)   Body mass index is 39.03 kg/m. Wt Readings from Last 3 Encounters:  11/06/21 213 lb 6 oz (96.8 kg)  10/30/21 214 lb 9.6 oz (97.3 kg)  08/12/21 213 lb 2 oz (96.7 kg)    Physical Exam Vitals reviewed.  Constitutional:       Appearance: Normal appearance.  HENT:     Head: Normocephalic.     Mouth/Throat:     Mouth: Mucous membranes are moist.     Pharynx: Oropharynx is clear.  Eyes:     Extraocular Movements: Extraocular movements intact.     Pupils: Pupils are equal, round, and reactive to light.  Cardiovascular:     Rate and Rhythm: Normal rate and regular rhythm.     Pulses: Normal pulses.     Heart sounds: Normal heart sounds.  Pulmonary:     Effort: Pulmonary effort is normal.     Breath sounds: Normal breath sounds.  Musculoskeletal:     Cervical back: No tenderness.  Lymphadenopathy:     Cervical: No cervical adenopathy.  Skin:    General: Skin is warm and dry.  Neurological:     General: No focal deficit present.     Mental Status: She is alert and oriented to person, place, and time.  Psychiatric:        Mood and Affect: Mood normal.        Behavior: Behavior normal.    Results for orders placed or performed in visit on 11/06/21 (from the past 24 hour(s))  POCT HgB A1C     Status: Abnormal   Collection Time: 11/06/21  9:24 AM  Result Value Ref Range   Hemoglobin A1C 6.7 (A) 4.0 - 5.6 %   HbA1c POC (<> result, manual entry)     HbA1c, POC (prediabetic range)     HbA1c, POC (controlled diabetic range)       ASSESSMENT & PLAN: A total of 43 minutes was spent with the patient and counseling/coordination of care regarding preparing for this visit, review of most recent office visit notes, review of multiple chronic medical problems and their management, review of most recent blood work results including interpretation of today's hemoglobin A1c, review of all medications, cardiovascular risks associated with hypertension and diabetes, education on nutrition, prognosis, documentation, and need for follow-up.  Problem List Items Addressed This Visit       Cardiovascular and Mediastinum   Hypertension associated with diabetes ([place]) - Primary    Well-controlled hypertension. Continue  Hyzaar 100-12.5 mg and amlodipine 10 mg daily. Well-controlled diabetes with hemoglobin A1c of 6.7. Continue weekly Trulicity 1.5 mg and metformin 1000 mg twice a day Cardiovascular risks associated with diabetes and hypertension discussed. Diet and nutrition discussed. Follow-up in 3 months.      Relevant Orders   Ambulatory referral to Ophthalmology   POCT HgB A1C     Endocrine   Dyslipidemia associated with type 2 diabetes mellitus ([place])    Stable.  Diet and nutrition discussed. Continue atorvastatin 40 mg daily.       Other Visit Diagnoses     Need for vaccination       Relevant Orders   Flu Vaccine QUAD High Dose(Fluad)   Pneumococcal conjugate vaccine 20-valent (Prevnar 20)      Patient Instructions  Diabetes Mellitus and Nutrition, Adult When you have diabetes, or diabetes mellitus, it is very important to have healthy eating habits because your blood sugar (glucose) levels are greatly affected by what you eat and drink. Eating healthy foods in the right amounts, at about the same times every day, can help you: Manage your blood glucose. Lower your risk of heart disease. Improve your blood pressure. Reach or maintain a healthy weight. What can affect my meal plan? Every person with diabetes is different, and each person has different needs for a meal plan. Your health care provider may recommend that you work with a dietitian to make a meal plan that is best for you. Your meal plan may vary depending on factors such as: The calories you need. The medicines you take. Your weight. Your blood glucose, blood pressure, and cholesterol levels. Your activity level. Other health conditions you have, such as heart or kidney disease. How do carbohydrates affect me? Carbohydrates, also called carbs, affect your blood glucose level more than any other type of food. Eating carbs raises the amount of glucose in your blood. It is important to know how many  carbs you can safely  have in each meal. This is different for every person. Your dietitian can help you calculate how many carbs you should have at each meal and for each snack. How does alcohol affect me? Alcohol can cause a decrease in blood glucose (hypoglycemia), especially if you use insulin or take certain diabetes medicines by mouth. Hypoglycemia can be a life-threatening condition. Symptoms of hypoglycemia, such as sleepiness, dizziness, and confusion, are similar to symptoms of having too much alcohol. Do not drink alcohol if: Your health care provider tells you not to drink. You are pregnant, may be pregnant, or are planning to become pregnant. If you drink alcohol: Limit how much you have to: 0-1 drink a day for women. 0-2 drinks a day for men. Know how much alcohol is in your drink. In the [place] one drink equals one 12 oz bottle of beer (355 mL), one 5 oz glass of wine (148 mL), or one 1 oz glass of hard liquor (44 mL). Keep yourself hydrated with water, diet soda, or unsweetened iced tea. Keep in mind that regular soda, juice, and other mixers may contain a lot of sugar and must be counted as carbs. What are tips for following this plan?  Reading food labels Start by checking the serving size on the Nutrition Facts label of packaged foods and drinks. The number of calories and the amount of carbs, fats, and other nutrients listed on the label are based on one serving of the item. Many items contain more than one serving per package. Check the total grams (g) of carbs in one serving. Check the number of grams of saturated fats and trans fats in one serving. Choose foods that have a low amount or none of these fats. Check the number of milligrams (mg) of salt (sodium) in one serving. Most people should limit total sodium intake to less than 2,300 mg per day. Always check the nutrition information of foods labeled as "low-fat" or "nonfat." These foods may be higher in added sugar or refined carbs and should  be avoided. Talk to your dietitian to identify your daily goals for nutrients listed on the label. Shopping Avoid buying canned, pre-made, or processed foods. These foods tend to be high in fat, sodium, and added sugar. Shop around the outside edge of the grocery store. This is where you will most often find fresh fruits and vegetables, bulk grains, fresh meats, and fresh dairy products. Cooking Use low-heat cooking methods, such as baking, instead of high-heat cooking methods, such as deep frying. Cook using healthy oils, such as olive, canola, or sunflower oil. Avoid cooking with butter, cream, or high-fat meats. Meal planning Eat meals and snacks regularly, preferably at the same times every day. Avoid going long periods of time without eating. Eat foods that are high in fiber, such as fresh fruits, vegetables, beans, and whole grains. Eat 4-6 oz (112-168 g) of lean protein each day, such as lean meat, chicken, fish, eggs, or tofu. One ounce (oz) (28 g) of lean protein is equal to: 1 oz (28 g) of meat, chicken, or fish. 1 egg.  cup (62 g) of tofu. Eat some foods each day that contain healthy fats, such as avocado, nuts, seeds, and fish. What foods should I eat? Fruits Berries. Apples. Oranges. Peaches. Apricots. Plums. Grapes. Mangoes. Papayas. Pomegranates. Kiwi. Cherries. Vegetables Leafy greens, including lettuce, spinach, kale, chard, collard greens, mustard greens, and cabbage. Beets. Cauliflower. Broccoli. Carrots. Green beans. Tomatoes. Peppers. Onions. Cucumbers. Brussels  sprouts. Grains Whole grains, such as whole-wheat or whole-grain bread, crackers, tortillas, cereal, and pasta. Unsweetened oatmeal. Quinoa. Brown or wild rice. Meats and other proteins Seafood. Poultry without skin. Lean cuts of poultry and beef. Tofu. Nuts. Seeds. Dairy Low-fat or fat-free dairy products such as milk, yogurt, and cheese. The items listed above may not be a complete list of foods and  beverages you can eat and drink. Contact a dietitian for more information. What foods should I avoid? Fruits Fruits canned with syrup. Vegetables Canned vegetables. Frozen vegetables with butter or cream sauce. Grains Refined white flour and flour products such as bread, pasta, snack foods, and cereals. Avoid all processed foods. Meats and other proteins Fatty cuts of meat. Poultry with skin. Breaded or fried meats. Processed meat. Avoid saturated fats. Dairy Full-fat yogurt, cheese, or milk. Beverages Sweetened drinks, such as soda or iced tea. The items listed above may not be a complete list of foods and beverages you should avoid. Contact a dietitian for more information. Questions to ask a health care provider Do I need to meet with a certified diabetes care and education specialist? Do I need to meet with a dietitian? What number can I call if I have questions? When are the best times to check my blood glucose? Where to find more information: American Diabetes Association: diabetes.org Academy of Nutrition and Dietetics: eatright.[employer] of Diabetes and Digestive and Kidney Diseases: [employer]en[employer].is Association of Diabetes Care & Education Specialists: diabeteseducator.org Summary It is important to have healthy eating habits because your blood sugar (glucose) levels are greatly affected by what you eat and drink. It is important to use alcohol carefully. A healthy meal plan will help you manage your blood glucose and lower your risk of heart disease. Your health care provider may recommend that you work with a dietitian to make a meal plan that is best for you. This information is not intended to replace advice given to you by your health care provider. Make sure you discuss any questions you have with your health care provider. Document Revised: 08/24/2019 Document Reviewed: 08/24/2019 Elsevier Patient Education  [place],  MD [facility]

## 2021-11-16 ENCOUNTER — Other Ambulatory Visit: Payer: Self-pay | Admitting: Emergency Medicine

## 2021-11-22 ENCOUNTER — Other Ambulatory Visit: Payer: Self-pay | Admitting: Emergency Medicine

## 2021-11-29 NOTE — Pre-Procedure Instructions (Signed)
Surgical Instructions    Your procedure is scheduled on Tuesday, November 7th.  Report to [facility] Main Entrance "A" at 5:30 A.M., then check in with the Admitting office.  Call this number if you have problems the morning of surgery:  [phone]   If you have any questions prior to your surgery date call [phone]: Open Monday-Friday 8am-4pm    Remember:  Do not eat after midnight the night before your surgery  You may drink clear liquids until 4:30 a.m. the morning of your surgery.   Clear liquids allowed are: Water, Non-Citrus Juices (without pulp), Carbonated Beverages, Clear Tea, Black Coffee Only (NO MILK, CREAM OR POWDERED CREAMER of any kind), and Gatorade.  Patient Instructions   The day of surgery (if you have diabetes):  Drink TWO (2) small 12 oz bottles of Gatorade G2 the night before surgery. One (1) at dinner time and one (1) at bedtime.  Drink ONE (1) small 12 oz bottle of Gatorade G2 by 4:30 am the morning of surgery These bottles were given to you during your hospital  pre-op appointment visit.  Nothing else to drink after completing the  Small 12 oz bottle of Gatorade G2 the morning of surgery.         If you have questions, please contact your surgeon's office.      Take these medicines the morning of surgery with A SIP OF WATER  amLODipine (NORVASC) atorvastatin (LIPITOR) Fluticasone-Umeclidin-Vilant (TRELEGY ELLIPTA omeprazole (PRILOSEC)    Take these medications AS NEEDED the morning of surgery.  Albuterol nebulizer Albuterol inhaler fluticasone (FLONASE)   Follow your surgeon's instructions on when to stop Aspirin.  If no instructions were given by your surgeon then you will need to call the office to get those instructions.    As of today, STOP taking any Aleve, Naproxen, Ibuprofen, Motrin, Advil, Goody's, BC's, all herbal medications, fish oil, and all vitamins. This includes: diclofenac Sodium (VOLTAREN) 1 % GEL.  WHAT DO I DO ABOUT MY  DIABETES MEDICATION?   Do not take metFORMIN (GLUCOPHAGE) the morning of surgery.  HOLD Dulaglutide (TRULICITY) for 1 WEEK prior to surgery. DO NOT TAKE your Friday (11/3) dose.   HOW TO MANAGE YOUR DIABETES BEFORE AND AFTER SURGERY  Why is it important to control my blood sugar before and after surgery? Improving blood sugar levels before and after surgery helps healing and can limit problems. A way of improving blood sugar control is eating a healthy diet by:  Eating less sugar and carbohydrates  Increasing activity/exercise  Talking with your doctor about reaching your blood sugar goals High blood sugars (greater than 180 mg/dL) can raise your risk of infections and slow your recovery, so you will need to focus on controlling your diabetes during the weeks before surgery. Make sure that the doctor who takes care of your diabetes knows about your planned surgery including the date and location.  How do I manage my blood sugar before surgery? Check your blood sugar at least 4 times a day, starting 2 days before surgery, to make sure that the level is not too high or low.  Check your blood sugar the morning of your surgery when you wake up and every 2 hours until you get to the Short Stay unit.  If your blood sugar is less than 70 mg/dL, you will need to treat for low blood sugar: Do not take insulin. Treat a low blood sugar (less than 70 mg/dL) with  cup of clear juice (  cranberry or apple), 4 glucose tablets, OR glucose gel. Recheck blood sugar in 15 minutes after treatment (to make sure it is greater than 70 mg/dL). If your blood sugar is not greater than 70 mg/dL on recheck, call [phone] for further instructions. Report your blood sugar to the short stay nurse when you get to Short Stay.  If you are admitted to the hospital after surgery: Your blood sugar will be checked by the staff and you will probably be given insulin after surgery (instead of oral diabetes medicines) to  make sure you have good blood sugar levels. The goal for blood sugar control after surgery is 80-180 mg/dL.                      Do NOT Smoke (Tobacco/Vaping) for 24 hours prior to your procedure.  If you use a CPAP at night, you may bring your mask/headgear for your overnight stay.   Contacts, glasses, piercing's, hearing aid's, dentures or partials may not be worn into surgery, please bring cases for these belongings.    For patients admitted to the hospital, discharge time will be determined by your treatment team.   Patients discharged the day of surgery will not be allowed to drive home, and someone needs to stay with them for 24 hours.  SURGICAL WAITING ROOM VISITATION Patients having surgery or a procedure may have no more than 2 support people in the waiting area - these visitors may rotate.   Children under the age of 100 must have an adult with them who is not the patient. If the patient needs to stay at the hospital during part of their recovery, the visitor guidelines for inpatient rooms apply. Pre-op nurse will coordinate an appropriate time for 1 support person to accompany patient in pre-op.  This support person may not rotate.   Please refer to the [facility] website for the visitor guidelines for Inpatients (after your surgery is over and you are in a regular room).    Special instructions:   [place]- Preparing For Surgery  Before surgery, you can play an important role. Because skin is not sterile, your skin needs to be as free of germs as possible. You can reduce the number of germs on your skin by washing with CHG (chlorahexidine gluconate) Soap before surgery.  CHG is an antiseptic cleaner which kills germs and bonds with the skin to continue killing germs even after washing.    Oral Hygiene is also important to reduce your risk of infection.  Remember - BRUSH YOUR TEETH THE MORNING OF SURGERY WITH YOUR REGULAR TOOTHPASTE  Please do not use if you have an  allergy to CHG or antibacterial soaps. If your skin becomes reddened/irritated stop using the CHG.  Do not shave (including legs and underarms) for at least 48 hours prior to first CHG shower. It is OK to shave your face.  Please follow these instructions carefully.   Shower the NIGHT BEFORE SURGERY and the MORNING OF SURGERY  If you chose to wash your hair, wash your hair first as usual with your normal shampoo.  After you shampoo, rinse your hair and body thoroughly to remove the shampoo.  Use CHG Soap as you would any other liquid soap. You can apply CHG directly to the skin and wash gently with a scrungie or a clean washcloth.   Apply the CHG Soap to your body ONLY FROM THE NECK DOWN.  Do not use on open wounds or open  sores. Avoid contact with your eyes, ears, mouth and genitals (private parts). Wash Face and genitals (private parts)  with your normal soap.   Wash thoroughly, paying special attention to the area where your surgery will be performed.  Thoroughly rinse your body with warm water from the neck down.  DO NOT shower/wash with your normal soap after using and rinsing off the CHG Soap.  Pat yourself dry with a CLEAN TOWEL.  Wear CLEAN PAJAMAS to bed the night before surgery  Place CLEAN SHEETS on your bed the night before your surgery  DO NOT SLEEP WITH PETS.   Day of Surgery: Take a shower with CHG soap. Do not wear jewelry or makeup Do not wear lotions, powders, perfumes, or deodorant. Do not shave 48 hours prior to surgery.  Do not bring valuables to the hospital.  [facility] is not responsible for any belongings or valuables. Do not wear nail polish, gel polish, artificial nails, or any other type of covering on natural nails (fingers and toes) If you have artificial nails or gel coating that need to be removed by a nail salon, please have this removed prior to surgery. Artificial nails or gel coating may interfere with anesthesia's ability to adequately  monitor your vital signs. Wear Clean/Comfortable clothing the morning of surgery Remember to brush your teeth WITH YOUR REGULAR TOOTHPASTE.   Please read over the following fact sheets that you were given.    If you received a COVID test during your pre-op visit  it is requested that you wear a mask when out in public, stay away from anyone that may not be feeling well and notify your surgeon if you develop symptoms. If you have been in contact with anyone that has tested positive in the last 10 days please notify you surgeon.

## 2021-12-02 ENCOUNTER — Other Ambulatory Visit: Payer: Self-pay

## 2021-12-02 ENCOUNTER — Encounter (HOSPITAL_COMMUNITY)
Admission: RE | Admit: 2021-12-02 | Discharge: 2021-12-02 | Disposition: A | Discharge status: Home / Self Care | Payer: Medicare Other | Source: Ambulatory Visit | Attending: General Surgery | Admitting: General Surgery

## 2021-12-02 ENCOUNTER — Encounter (HOSPITAL_COMMUNITY): Payer: Self-pay

## 2021-12-02 VITALS — BP 150/79 | HR 94 | Temp 97.6°F | Resp 18 | Ht 62.0 in | Wt 213.2 lb

## 2021-12-02 DIAGNOSIS — E119 Type 2 diabetes mellitus without complications: Secondary | ICD-10-CM | POA: Diagnosis not present

## 2021-12-02 DIAGNOSIS — Z01812 Encounter for preprocedural laboratory examination: Secondary | ICD-10-CM | POA: Diagnosis not present

## 2021-12-02 DIAGNOSIS — Z01818 Encounter for other preprocedural examination: Secondary | ICD-10-CM

## 2021-12-02 LAB — CBC
HCT: 39.3 % (ref 36.0–46.0)
Hemoglobin: 11.7 g/dL — ABNORMAL LOW (ref 12.0–15.0)
MCH: 23.4 pg — ABNORMAL LOW (ref 26.0–34.0)
MCHC: 29.8 g/dL — ABNORMAL LOW (ref 30.0–36.0)
MCV: 78.6 fL — ABNORMAL LOW (ref 80.0–100.0)
Platelets: 360 10*3/uL (ref 150–400)
RBC: 5 MIL/uL (ref 3.87–5.11)
RDW: 17.1 % — ABNORMAL HIGH (ref 11.5–15.5)
WBC: 7.5 10*3/uL (ref 4.0–10.5)
nRBC: 0 % (ref 0.0–0.2)

## 2021-12-02 LAB — BASIC METABOLIC PANEL
Anion gap: 15 (ref 5–15)
BUN: 28 mg/dL — ABNORMAL HIGH (ref 8–23)
CO2: 23 mmol/L (ref 22–32)
Calcium: 9.6 mg/dL (ref 8.9–10.3)
Chloride: 102 mmol/L (ref 98–111)
Creatinine, Ser: 0.9 mg/dL (ref 0.44–1.00)
GFR, Estimated: >60 mL/min (ref >60)
Glucose, Bld: 144 mg/dL — ABNORMAL HIGH (ref 70–99)
Potassium: 4.5 mmol/L (ref 3.5–5.1)
Sodium: 140 mmol/L (ref 135–145)

## 2021-12-02 LAB — GLUCOSE, CAPILLARY: Glucose-Capillary: 213 mg/dL — ABNORMAL HIGH (ref 70–99)

## 2021-12-02 NOTE — Progress Notes (Signed)
PCP - Agustina Caroli, MD Cardiologist - Dr. Kelvin Cellar  PPM/ICD - n/a  Chest x-ray - n/a EKG - 10/30/21 Stress Test - 08/21/20 ECHO - 08/23/20 Cardiac Cath - denies  Sleep Study - denies, stop bang score 4 CPAP - denies  Fasting Blood Sugar - 125-134 Checks Blood Sugar 1-2 times a week  Last dose of GLP1 agonist- LD Trulicity 59/56/38  GLP1 instructions: Hold Trulicity for 1 week prior to surgery, Skip 11/3 dose.  Blood Thinner Instructions: n/a Aspirin Instructions: Pt instructed to contact Dr. Rosendo Gros and cardiology's office for instructions on when to hold ASA.   ERAS Protcol -Clear liquids until 0430 DOS. Three (3) Ensure drinks ordered. PRE-SURGERY Ensure or G2- ?Drink TWO (2) small 12 oz bottles of Gatorade G2 the night before surgery. One (1) at dinner time and one (1) at bedtime.  ? Drink ONE (1) small 12 oz bottle of Gatorade G2 by 4:30 am the morning of surgery.  COVID TEST- n/a  Anesthesia review: No  Patient denies shortness of breath, fever, cough and chest pain at PAT appointment   All instructions explained to the patient, with a verbal understanding of the material. Patient agrees to go over the instructions while at home for a better understanding. Patient also instructed to self quarantine after being tested for COVID-19. The opportunity to ask questions was provided.

## 2021-12-03 ENCOUNTER — Encounter: Payer: Self-pay | Admitting: Internal Medicine

## 2021-12-03 ENCOUNTER — Encounter: Payer: Self-pay | Admitting: Emergency Medicine

## 2021-12-09 NOTE — Anesthesia Preprocedure Evaluation (Signed)
Anesthesia Evaluation  Patient identified by MRN, date of birth, ID band Patient awake    Reviewed: Allergy & Precautions, NPO status , Patient's Chart, lab work & pertinent test results  Airway Mallampati: II  TM Distance: >3 FB Neck ROM: Full    Dental no notable dental hx.    Pulmonary asthma    Pulmonary exam normal breath sounds clear to auscultation       Cardiovascular hypertension, Pt. on medications Normal cardiovascular exam+ Valvular Problems/Murmurs  Rhythm:Regular Rate:Normal  Echo 08/2020  1. Left ventricular ejection fraction, by estimation, is 55 to 60%. The left ventricle has normal function. The left ventricle has no regional wall motion abnormalities. Left ventricular diastolic parameters are consistent with Grade I diastolic dysfunction (impaired relaxation). The average left ventricular global longitudinal strain is -18.6%.   2. Right ventricular systolic function is normal. The right ventricular size is normal.   3. The mitral valve is normal in structure. No evidence of mitral valve regurgitation. No evidence of mitral stenosis.   4. The aortic valve is tricuspid. Aortic valve regurgitation is trivial. No aortic stenosis is present.   5. The inferior vena cava is normal in size with greater than 50% respiratory variability, suggesting right atrial pressure of 3 mmHg.     NM Stress  The left ventricular ejection fraction is mildly decreased (45-54%).  Nuclear stress EF: 52%.  Defect 1: There is a small defect of moderate severity present in the apical anterior, apical septal and apex location.  This is a low risk study.   Low risk, probably normal stress nuclear study with small fixed distal septal/apical defect possibly secondary to patient's left bundle branch block; no ischemia.  Gated ejection fraction 52% with normal wall motion.      Neuro/Psych negative neurological ROS     GI/Hepatic negative  GI ROS, Neg liver ROS,,,  Endo/Other  diabetes  Morbid obesity  Renal/GU negative Renal ROS     Musculoskeletal  (+) Arthritis ,    Abdominal  (+) + obese  Peds  Hematology  (+) Blood dyscrasia, anemia   Anesthesia Other Findings   Reproductive/Obstetrics                              Anesthesia Physical Anesthesia Plan  ASA: 3  Anesthesia Plan: General   Post-op Pain Management: Tylenol PO (pre-op)*, Lidocaine infusion* and Gabapentin PO (pre-op)*   Induction: Intravenous  PONV Risk Score and Plan: 4 or greater and Ondansetron, Dexamethasone, Treatment may vary due to age or medical condition, Scopolamine patch - Pre-op and Midazolam  Airway Management Planned: Oral ETT  Additional Equipment:   Intra-op Plan:   Post-operative Plan: Extubation in OR  Informed Consent: I have reviewed the patients History and Physical, chart, labs and discussed the procedure including the risks, benefits and alternatives for the proposed anesthesia with the patient or authorized representative who has indicated his/her understanding and acceptance.     Dental advisory given  Plan Discussed with: CRNA  Anesthesia Plan Comments: (+/- clearsight)         Anesthesia Quick Evaluation

## 2021-12-10 ENCOUNTER — Observation Stay (HOSPITAL_COMMUNITY)
Admission: RE | Admit: 2021-12-10 | Discharge: 2021-12-11 | Disposition: A | Discharge status: Home / Self Care | Payer: Medicare Other | Attending: General Surgery | Admitting: General Surgery

## 2021-12-10 ENCOUNTER — Ambulatory Visit (HOSPITAL_COMMUNITY): Payer: Medicare Other | Admitting: Anesthesiology

## 2021-12-10 ENCOUNTER — Ambulatory Visit (HOSPITAL_BASED_OUTPATIENT_CLINIC_OR_DEPARTMENT_OTHER): Payer: Medicare Other | Admitting: Anesthesiology

## 2021-12-10 ENCOUNTER — Other Ambulatory Visit: Payer: Self-pay

## 2021-12-10 ENCOUNTER — Encounter (HOSPITAL_COMMUNITY)
Admission: RE | Disposition: A | Discharge status: Home / Self Care | Payer: Self-pay | Source: Home / Self Care | Attending: General Surgery

## 2021-12-10 ENCOUNTER — Encounter (HOSPITAL_COMMUNITY): Payer: Self-pay | Admitting: General Surgery

## 2021-12-10 DIAGNOSIS — E119 Type 2 diabetes mellitus without complications: Secondary | ICD-10-CM

## 2021-12-10 DIAGNOSIS — Z7982 Long term (current) use of aspirin: Secondary | ICD-10-CM | POA: Diagnosis not present

## 2021-12-10 DIAGNOSIS — K432 Incisional hernia without obstruction or gangrene: Secondary | ICD-10-CM | POA: Diagnosis present

## 2021-12-10 DIAGNOSIS — Z8719 Personal history of other diseases of the digestive system: Secondary | ICD-10-CM

## 2021-12-10 DIAGNOSIS — K430 Incisional hernia with obstruction, without gangrene: Secondary | ICD-10-CM | POA: Diagnosis not present

## 2021-12-10 DIAGNOSIS — Z6838 Body mass index (BMI) 38.0-38.9, adult: Secondary | ICD-10-CM

## 2021-12-10 DIAGNOSIS — Z96652 Presence of left artificial knee joint: Secondary | ICD-10-CM | POA: Diagnosis not present

## 2021-12-10 DIAGNOSIS — I10 Essential (primary) hypertension: Secondary | ICD-10-CM | POA: Diagnosis not present

## 2021-12-10 DIAGNOSIS — R7309 Other abnormal glucose: Secondary | ICD-10-CM | POA: Diagnosis not present

## 2021-12-10 DIAGNOSIS — J45909 Unspecified asthma, uncomplicated: Secondary | ICD-10-CM

## 2021-12-10 HISTORY — PX: XI ROBOTIC ASSISTED VENTRAL HERNIA: SHX6789

## 2021-12-10 HISTORY — PX: INSERTION OF MESH: SHX5868

## 2021-12-10 LAB — GLUCOSE, CAPILLARY
Glucose-Capillary: 140 mg/dL — ABNORMAL HIGH (ref 70–99)
Glucose-Capillary: 165 mg/dL — ABNORMAL HIGH (ref 70–99)
Glucose-Capillary: 257 mg/dL — ABNORMAL HIGH (ref 70–99)
Glucose-Capillary: 283 mg/dL — ABNORMAL HIGH (ref 70–99)

## 2021-12-10 SURGERY — REPAIR, HERNIA, VENTRAL, ROBOT-ASSISTED
Anesthesia: General | Site: Abdomen

## 2021-12-10 MED ORDER — LOSARTAN POTASSIUM 50 MG PO TABS
100.0000 mg | ORAL_TABLET | Freq: Every day | ORAL | Status: DC
  Filled 2021-12-10: qty 2

## 2021-12-10 MED ORDER — SCOPOLAMINE 1 MG/3DAYS TD PT72
MEDICATED_PATCH | TRANSDERMAL | Status: DC | PRN
  Administered 2021-12-10: 1 via TRANSDERMAL

## 2021-12-10 MED ORDER — 0.9 % SODIUM CHLORIDE (POUR BTL) OPTIME
TOPICAL | Status: DC | PRN
  Administered 2021-12-10: 1000 mL

## 2021-12-10 MED ORDER — FENTANYL CITRATE (PF) 250 MCG/5ML IJ SOLN
INTRAMUSCULAR | Status: AC
  Filled 2021-12-10: qty 5

## 2021-12-10 MED ORDER — CHLORHEXIDINE GLUCONATE CLOTH 2 % EX PADS: 6.0000 | MEDICATED_PAD | Freq: Once | CUTANEOUS | Status: DC

## 2021-12-10 MED ORDER — BUPIVACAINE HCL (PF) 0.25 % IJ SOLN
INTRAMUSCULAR | Status: AC
  Filled 2021-12-10: qty 30

## 2021-12-10 MED ORDER — DEXTROSE-NACL 5-0.9 % IV SOLN: INTRAVENOUS | Status: DC

## 2021-12-10 MED ORDER — AMLODIPINE BESYLATE 10 MG PO TABS: 10.0000 mg | ORAL_TABLET | Freq: Every day | ORAL | Status: DC

## 2021-12-10 MED ORDER — PROPOFOL 10 MG/ML IV BOLUS
INTRAVENOUS | Status: DC | PRN
  Administered 2021-12-10: 150 mg via INTRAVENOUS

## 2021-12-10 MED ORDER — AMISULPRIDE (ANTIEMETIC) 5 MG/2ML IV SOLN: 10.0000 mg | Freq: Once | INTRAVENOUS | Status: DC | PRN

## 2021-12-10 MED ORDER — ATORVASTATIN CALCIUM 40 MG PO TABS: 40.0000 mg | ORAL_TABLET | Freq: Every day | ORAL | Status: DC

## 2021-12-10 MED ORDER — ONDANSETRON HCL 4 MG/2ML IJ SOLN
INTRAMUSCULAR | Status: AC
  Filled 2021-12-10: qty 2

## 2021-12-10 MED ORDER — INSULIN ASPART 100 UNIT/ML IJ SOLN
0.0000 [IU] | INTRAMUSCULAR | Status: DC | PRN
  Administered 2021-12-10: 2 [IU] via SUBCUTANEOUS
  Filled 2021-12-10: qty 1

## 2021-12-10 MED ORDER — MEPERIDINE HCL 25 MG/ML IJ SOLN: 6.2500 mg | INTRAMUSCULAR | Status: DC | PRN

## 2021-12-10 MED ORDER — DEXAMETHASONE SODIUM PHOSPHATE 10 MG/ML IJ SOLN
INTRAMUSCULAR | Status: AC
  Filled 2021-12-10: qty 1

## 2021-12-10 MED ORDER — ONDANSETRON HCL 4 MG/2ML IJ SOLN: 4.0000 mg | Freq: Four times a day (QID) | INTRAMUSCULAR | Status: DC | PRN

## 2021-12-10 MED ORDER — CHLORHEXIDINE GLUCONATE 0.12 % MT SOLN
15.0000 mL | Freq: Once | OROMUCOSAL | Status: AC
  Administered 2021-12-10: 15 mL via OROMUCOSAL
  Filled 2021-12-10: qty 15

## 2021-12-10 MED ORDER — WHITE PETROLATUM EX OINT
TOPICAL_OINTMENT | CUTANEOUS | Status: DC | PRN
  Filled 2021-12-10: qty 28.35

## 2021-12-10 MED ORDER — ALBUTEROL SULFATE HFA 108 (90 BASE) MCG/ACT IN AERS: 2.0000 | INHALATION_SPRAY | Freq: Four times a day (QID) | RESPIRATORY_TRACT | Status: DC | PRN

## 2021-12-10 MED ORDER — PROMETHAZINE HCL 25 MG/ML IJ SOLN: 6.2500 mg | INTRAMUSCULAR | Status: DC | PRN

## 2021-12-10 MED ORDER — HYDROMORPHONE HCL 1 MG/ML IJ SOLN: 0.2500 mg | INTRAMUSCULAR | Status: DC | PRN

## 2021-12-10 MED ORDER — FLUTICASONE PROPIONATE 50 MCG/ACT NA SUSP: 2.0000 | Freq: Every day | NASAL | Status: DC | PRN

## 2021-12-10 MED ORDER — BUPIVACAINE LIPOSOME 1.3 % IJ SUSP
INTRAMUSCULAR | Status: AC
  Filled 2021-12-10: qty 20

## 2021-12-10 MED ORDER — MIDAZOLAM HCL 2 MG/2ML IJ SOLN
INTRAMUSCULAR | Status: DC | PRN
  Administered 2021-12-10: 2 mg via INTRAVENOUS

## 2021-12-10 MED ORDER — ALBUTEROL SULFATE HFA 108 (90 BASE) MCG/ACT IN AERS
INHALATION_SPRAY | RESPIRATORY_TRACT | Status: AC
  Filled 2021-12-10: qty 6.7

## 2021-12-10 MED ORDER — LIDOCAINE 2% (20 MG/ML) 5 ML SYRINGE
INTRAMUSCULAR | Status: DC | PRN
  Administered 2021-12-10: 100 mg via INTRAVENOUS

## 2021-12-10 MED ORDER — ONDANSETRON 4 MG PO TBDP: 4.0000 mg | ORAL_TABLET | Freq: Four times a day (QID) | ORAL | Status: DC | PRN

## 2021-12-10 MED ORDER — ORAL CARE MOUTH RINSE: 15.0000 mL | Freq: Once | OROMUCOSAL | Status: AC

## 2021-12-10 MED ORDER — SODIUM CHLORIDE 0.9 % IV SOLN
INTRAVENOUS | Status: DC | PRN
  Administered 2021-12-10: 40 mL

## 2021-12-10 MED ORDER — DEXAMETHASONE SODIUM PHOSPHATE 10 MG/ML IJ SOLN
INTRAMUSCULAR | Status: DC | PRN
  Administered 2021-12-10: 5 mg via INTRAVENOUS

## 2021-12-10 MED ORDER — GABAPENTIN 300 MG PO CAPS
300.0000 mg | ORAL_CAPSULE | Freq: Once | ORAL | Status: AC
  Administered 2021-12-10: 300 mg via ORAL
  Filled 2021-12-10: qty 1

## 2021-12-10 MED ORDER — KETOROLAC TROMETHAMINE 15 MG/ML IJ SOLN: 15.0000 mg | Freq: Four times a day (QID) | INTRAMUSCULAR | Status: DC | PRN

## 2021-12-10 MED ORDER — SODIUM CHLORIDE 0.9% FLUSH: INTRAVENOUS | Status: DC | PRN

## 2021-12-10 MED ORDER — VISTASEAL 10 ML SINGLE DOSE KIT
10.0000 mL | PACK | Freq: Once | CUTANEOUS | Status: DC
  Filled 2021-12-10: qty 10

## 2021-12-10 MED ORDER — PROPOFOL 10 MG/ML IV BOLUS
INTRAVENOUS | Status: AC
  Filled 2021-12-10: qty 20

## 2021-12-10 MED ORDER — FENTANYL CITRATE (PF) 100 MCG/2ML IJ SOLN
INTRAMUSCULAR | Status: DC | PRN
  Administered 2021-12-10 (×3): 50 ug via INTRAVENOUS

## 2021-12-10 MED ORDER — ACETAMINOPHEN 500 MG PO TABS
1000.0000 mg | ORAL_TABLET | ORAL | Status: AC
  Administered 2021-12-10: 1000 mg via ORAL
  Filled 2021-12-10: qty 2

## 2021-12-10 MED ORDER — LOSARTAN POTASSIUM-HCTZ 100-12.5 MG PO TABS: 1.0000 | ORAL_TABLET | Freq: Every day | ORAL | Status: DC

## 2021-12-10 MED ORDER — ONDANSETRON HCL 4 MG/2ML IJ SOLN
INTRAMUSCULAR | Status: DC | PRN
  Administered 2021-12-10: 4 mg via INTRAVENOUS

## 2021-12-10 MED ORDER — LACTATED RINGERS IV SOLN: INTRAVENOUS | Status: DC

## 2021-12-10 MED ORDER — BUPIVACAINE LIPOSOME 1.3 % IJ SUSP: INTRAMUSCULAR | Status: DC | PRN

## 2021-12-10 MED ORDER — MIDAZOLAM HCL 2 MG/2ML IJ SOLN
INTRAMUSCULAR | Status: AC
  Filled 2021-12-10: qty 2

## 2021-12-10 MED ORDER — PANTOPRAZOLE SODIUM 40 MG PO TBEC
40.0000 mg | DELAYED_RELEASE_TABLET | Freq: Every day | ORAL | Status: DC
  Administered 2021-12-10: 40 mg via ORAL
  Filled 2021-12-10: qty 1

## 2021-12-10 MED ORDER — HYDROCHLOROTHIAZIDE 12.5 MG PO TABS
12.5000 mg | ORAL_TABLET | Freq: Every day | ORAL | Status: DC
  Filled 2021-12-10: qty 1

## 2021-12-10 MED ORDER — ENSURE PRE-SURGERY PO LIQD: 296.0000 mL | Freq: Once | ORAL | Status: DC

## 2021-12-10 MED ORDER — SUGAMMADEX SODIUM 200 MG/2ML IV SOLN
INTRAVENOUS | Status: DC | PRN
  Administered 2021-12-10: 200 mg via INTRAVENOUS

## 2021-12-10 MED ORDER — OXYCODONE HCL 5 MG PO TABS: 5.0000 mg | ORAL_TABLET | ORAL | Status: DC | PRN

## 2021-12-10 MED ORDER — STERILE WATER FOR IRRIGATION IR SOLN
Status: DC | PRN
  Administered 2021-12-10: 1000 mL

## 2021-12-10 MED ORDER — ACETAMINOPHEN 500 MG PO TABS: 1000.0000 mg | ORAL_TABLET | Freq: Once | ORAL | Status: AC

## 2021-12-10 MED ORDER — ALBUTEROL SULFATE (2.5 MG/3ML) 0.083% IN NEBU: 2.5000 mg | INHALATION_SOLUTION | Freq: Four times a day (QID) | RESPIRATORY_TRACT | Status: DC | PRN

## 2021-12-10 MED ORDER — ROCURONIUM BROMIDE 10 MG/ML (PF) SYRINGE
PREFILLED_SYRINGE | INTRAVENOUS | Status: AC
  Filled 2021-12-10: qty 10

## 2021-12-10 MED ORDER — BUPIVACAINE HCL 0.25 % IJ SOLN
INTRAMUSCULAR | Status: DC | PRN
  Administered 2021-12-10: 20 mL

## 2021-12-10 MED ORDER — METFORMIN HCL 500 MG PO TABS
1000.0000 mg | ORAL_TABLET | Freq: Two times a day (BID) | ORAL | Status: DC
  Administered 2021-12-10: 1000 mg via ORAL
  Filled 2021-12-10: qty 2

## 2021-12-10 MED ORDER — METHOCARBAMOL 500 MG PO TABS: 500.0000 mg | ORAL_TABLET | Freq: Four times a day (QID) | ORAL | Status: DC | PRN

## 2021-12-10 MED ORDER — VISTASEAL 10 ML SINGLE DOSE KIT
PACK | CUTANEOUS | Status: DC | PRN
  Administered 2021-12-10: 10 mL via TOPICAL

## 2021-12-10 MED ORDER — ENSURE PRE-SURGERY PO LIQD: 592.0000 mL | Freq: Once | ORAL | Status: DC

## 2021-12-10 MED ORDER — ALBUTEROL SULFATE HFA 108 (90 BASE) MCG/ACT IN AERS
INHALATION_SPRAY | RESPIRATORY_TRACT | Status: DC | PRN
  Administered 2021-12-10: 2 via RESPIRATORY_TRACT

## 2021-12-10 MED ORDER — LIDOCAINE 2% (20 MG/ML) 5 ML SYRINGE
INTRAMUSCULAR | Status: AC
  Filled 2021-12-10: qty 5

## 2021-12-10 MED ORDER — CEFAZOLIN SODIUM-DEXTROSE 2-4 GM/100ML-% IV SOLN
2.0000 g | INTRAVENOUS | Status: AC
  Administered 2021-12-10: 2 g via INTRAVENOUS
  Filled 2021-12-10: qty 100

## 2021-12-10 MED ORDER — ROCURONIUM BROMIDE 10 MG/ML (PF) SYRINGE
PREFILLED_SYRINGE | INTRAVENOUS | Status: DC | PRN
  Administered 2021-12-10: 20 mg via INTRAVENOUS
  Administered 2021-12-10: 60 mg via INTRAVENOUS
  Administered 2021-12-10: 20 mg via INTRAVENOUS

## 2021-12-10 SURGICAL SUPPLY — 54 items
APPLICATOR VISTASEAL 35 (MISCELLANEOUS) IMPLANT
BAG COUNTER SPONGE SURGICOUNT (BAG) IMPLANT
BINDER ABDOMINAL 10 UNV 27-48 (MISCELLANEOUS) IMPLANT
BINDER ABDOMINAL 12 ML 46-62 (SOFTGOODS) IMPLANT
CHLORAPREP W/TINT 26 (MISCELLANEOUS) ×2 IMPLANT
COVER MAYO STAND STRL (DRAPES) ×2 IMPLANT
COVER SURGICAL LIGHT HANDLE (MISCELLANEOUS) ×2 IMPLANT
COVER TIP SHEARS 8 DVNC (MISCELLANEOUS) ×2 IMPLANT
COVER TIP SHEARS 8MM DA VINCI (MISCELLANEOUS) ×2
DEFOGGER SCOPE WARMER CLEARIFY (MISCELLANEOUS) ×2 IMPLANT
DERMABOND ADVANCED .7 DNX12 (GAUZE/BANDAGES/DRESSINGS) ×2 IMPLANT
DEVICE SECURE STRAP 25 ABSORB (INSTRUMENTS) IMPLANT
DEVICE TROCAR PUNCTURE CLOSURE (ENDOMECHANICALS) ×2 IMPLANT
DRAPE ARM DVNC X/XI (DISPOSABLE) ×8 IMPLANT
DRAPE COLUMN DVNC XI (DISPOSABLE) ×2 IMPLANT
DRAPE CV SPLIT W-CLR ANES SCRN (DRAPES) ×2 IMPLANT
DRAPE DA VINCI XI ARM (DISPOSABLE) ×8
DRAPE DA VINCI XI COLUMN (DISPOSABLE) ×2
DRAPE ORTHO SPLIT 77X108 STRL (DRAPES) ×2
DRAPE SURG ORHT 6 SPLT 77X108 (DRAPES) ×2 IMPLANT
GLOVE BIO SURGEON STRL SZ7.5 (GLOVE) ×4 IMPLANT
GLOVE PROTEXIS LATEX SZ 7.5 (GLOVE) ×4 IMPLANT
GLOVE SURG LATEX 7.5 PF (GLOVE) ×4 IMPLANT
GOWN STRL REUS W/ TWL LRG LVL3 (GOWN DISPOSABLE) ×4 IMPLANT
GOWN STRL REUS W/ TWL XL LVL3 (GOWN DISPOSABLE) ×4 IMPLANT
GOWN STRL REUS W/TWL 2XL LVL3 (GOWN DISPOSABLE) ×2 IMPLANT
GOWN STRL REUS W/TWL LRG LVL3 (GOWN DISPOSABLE) ×4
GOWN STRL REUS W/TWL XL LVL3 (GOWN DISPOSABLE) ×4
IRRIG SUCT STRYKERFLOW 2 WTIP (MISCELLANEOUS)
IRRIGATION SUCT STRKRFLW 2 WTP (MISCELLANEOUS) IMPLANT
KIT BASIN OR (CUSTOM PROCEDURE TRAY) ×2 IMPLANT
KIT TURNOVER KIT B (KITS) ×2 IMPLANT
MARKER SKIN DUAL TIP RULER LAB (MISCELLANEOUS) ×2 IMPLANT
MESH SOFT 12X12IN BARD (Mesh General) IMPLANT
NEEDLE HYPO 22GX1.5 SAFETY (NEEDLE) ×2 IMPLANT
PAD ARMBOARD 7.5X6 YLW CONV (MISCELLANEOUS) ×4 IMPLANT
PENCIL SMOKE EVACUATOR (MISCELLANEOUS) IMPLANT
SCISSORS LAP 5X35 DISP (ENDOMECHANICALS) IMPLANT
SEAL CANN UNIV 5-8 DVNC XI (MISCELLANEOUS) ×6 IMPLANT
SEAL XI 5MM-8MM UNIVERSAL (MISCELLANEOUS) ×6
SET TUBE SMOKE EVAC HIGH FLOW (TUBING) ×2 IMPLANT
SPIKE FLUID TRANSFER (MISCELLANEOUS) ×2 IMPLANT
STOPCOCK 4 WAY LG BORE MALE ST (IV SETS) ×2 IMPLANT
SUT DVC VLOC 180 0 12IN GS21 (SUTURE) ×6
SUT MNCRL AB 4-0 PS2 18 (SUTURE) ×2 IMPLANT
SUT VIC AB 2-0 CTX 36 (SUTURE) IMPLANT
SUT VLOC 180 0 9IN  GS21 (SUTURE)
SUT VLOC 180 0 9IN GS21 (SUTURE) IMPLANT
SUT VLOC 180 2-0 6IN GS21 (SUTURE) ×2 IMPLANT
SUT VLOC 180 2-0 9IN GS21 (SUTURE) IMPLANT
SUTURE DVC VLC 180 0 12IN GS21 (SUTURE) IMPLANT
TOWEL GREEN STERILE FF (TOWEL DISPOSABLE) ×2 IMPLANT
TRAY LAPAROSCOPIC MC (CUSTOM PROCEDURE TRAY) ×2 IMPLANT
TROCAR XCEL NON-BLD 5MMX100MML (ENDOMECHANICALS) IMPLANT

## 2021-12-10 NOTE — Anesthesia Postprocedure Evaluation (Signed)
Anesthesia Post Note  Patient: Catherine Cline  Procedure(s) Performed: ROBOTIC INCISIONAL HERNIA REPAIR WITH MESH INSERTION OF MESH (Abdomen)     Patient location during evaluation: PACU Anesthesia Type: General Level of consciousness: sedated and patient cooperative Pain management: pain level controlled Vital Signs Assessment: post-procedure vital signs reviewed and stable Respiratory status: spontaneous breathing Cardiovascular status: stable Anesthetic complications: no   No notable events documented.  Last Vitals:  Vitals:   12/10/21 1330 12/10/21 1400  BP: (!) 108/90 129/74  Pulse: 84   Resp: 14 16  Temp: 36.7 C 36.9 C  SpO2: 100% 96%    Last Pain:  Vitals:   12/10/21 1400  TempSrc: Oral  PainSc:                  Nolon Nations

## 2021-12-10 NOTE — Op Note (Addendum)
12/10/2021  9:58 AM  PATIENT:  Catherine Cline  66 y.o. female  PRE-OPERATIVE DIAGNOSIS:  recurrent incisional hernia, 6cm  POST-OPERATIVE DIAGNOSIS:  recurrent incisional hernia 8x4cm  PROCEDURE:  Procedure(s): ROBOTIC INCISIONAL HERNIA REPAIR WITH MESH POSSIBLE OPEN (N/A) Unilateral Transversus abdominus release with musculocutaneous advancement   SURGEON:  Surgeon(s) and Role:    Ralene Ok, MD - Primary  ASSISTANTS: Pryor Curia, RNFA   ANESTHESIA:   local, regional, and general  EBL:  20 mL   BLOOD ADMINISTERED:none  DRAINS: none   LOCAL MEDICATIONS USED:  BUPIVICAINE  and OTHER exparel  SPECIMEN:  No Specimen  DISPOSITION OF SPECIMEN:  N/A  COUNTS:  YES  TOURNIQUET:  * No tourniquets in log *  DICTATION: .Dragon Dictation  After the patient was consented patient was taken back to the OR and placed in supine position with bilateral SCDs in place.  She underwent endotracheal intubation.  Patient was then prepped draped sterile fashion.  A timeout was called all facts verified.  At this time and Optiview technique was used 10 to the left subcostal retrorectus space.  Blunt dissection was undertaken to the left side of the retrorectus muscle.  Insufflation was begun.  This time with the scope I was able to create a space inferiorly for the robotic trocars.  Two 8 mm trocars placed in the left lower quadrant.  Subsequent to this I was able to take down some of the posterior rectus adhesions.  I proceeded with crossover in the apex of the retrorectus space.  The right retrorectus fascia was visualized.  This was then incised.  This was confirmed in the rectus muscle was seen.  At this time the robot was docked.  At this time I was able to dissect inferiorly the posterior rectus fascia contributions to the linea alba.  The hernia was encountered.  There was a large amount of omentum that was in the subcutaneous tissue that was incarcerated.  This was taken down.   There was no bowel that could be seen.  I circumferentially dissected away the hernia from the surrounding tissue.  The omentum was fully reduced.  At this time I proceeded to take down the right retrorectus fascia contributions to the linea alba.  I did this from a superior to inferior direction.  I proceeded to enter the preperitoneal space inferiorly to the arcuate line.  At this time and try to bring together the posterior fascia there was some tension.  So I proceeded with a transversus abdominis muscle release.  I did so from the superior to inferior direction.  The transversus abdominis fascia was incised approximately 1 cm medial to the neurovascular bundles.  The transversus abdominis muscle was easily dissected away from the peritoneum.  This was done inferiorly and laterally.  This allowed the posterior midline to be reapproximated without undue tension and I was able to advance the muscular fascia to the midline.  At this time 0 V-Loc x1 was used to reapproximate the posterior rectus fascia.  The linea alba and the hernia sac was imbricated using 2 0 V-Loc's.  The gas was turned down to approximately 7 mmHg at this time.  At this time the robot was undocked.  Laparoscopically I was able to measure the space.  This measured approximately 23 x 23 cm.  The Bard soft mesh was chosen and cut to shape and size.  This was introduced to the previously peritoneal space.  This widely cover the midline hernia defect.  At this time Tisseel glue was then used to help anchor the mesh in the midline.  The mesh lay flat.  I was able to move the insufflation under direct visualization.  At this time all trocars removed.  The trocar sites were then reapproximate using 4 Monocryl subcuticular fashion.  Skin was dressed Dermabond.  Patient tolerated the procedure well was taken to the recovery in stable condition.    PLAN OF CARE: Admit for overnight observation  PATIENT DISPOSITION: PACU and admit for OBS    Delay start of Pharmacological VTE agent (>24hrs) due to surgical blood loss or risk of bleeding: not applicable

## 2021-12-10 NOTE — Transfer of Care (Signed)
Immediate Anesthesia Transfer of Care Note  Patient: Catherine Cline  Procedure(s) Performed: ROBOTIC INCISIONAL HERNIA REPAIR WITH MESH INSERTION OF MESH (Abdomen)  Patient Location: PACU  Anesthesia Type:General  Level of Consciousness: awake and oriented  Airway & Oxygen Therapy: Patient Spontanous Breathing and Patient connected to nasal cannula oxygen  Post-op Assessment: Report given to RN  Post vital signs: Reviewed and stable  Last Vitals:  Vitals Value Taken Time  BP 136/70 12/10/21 1016  Temp    Pulse 91 12/10/21 1018  Resp 13 12/10/21 1018  SpO2 95 % 12/10/21 1018  Vitals shown include unvalidated device data.  Last Pain:  Vitals:   12/10/21 0702  TempSrc:   PainSc: 10-Worst pain ever      Patients Stated Pain Goal: 2 (64/33/29 5188)  Complications: No notable events documented.

## 2021-12-10 NOTE — Anesthesia Procedure Notes (Signed)
Procedure Name: Intubation Date/Time: 12/10/2021 7:39 AM  Performed by: Barrington Ellison, CRNAPre-anesthesia Checklist: Patient identified, Emergency Drugs available, Suction available and Patient being monitored Patient Re-evaluated:Patient Re-evaluated prior to induction Oxygen Delivery Method: Circle System Utilized Preoxygenation: Pre-oxygenation with 100% oxygen Induction Type: IV induction Ventilation: Mask ventilation without difficulty Laryngoscope Size: Mac and 3 Grade View: Grade I Tube type: Oral Tube size: 7.0 mm Number of attempts: 1 Airway Equipment and Method: Stylet and Oral airway Placement Confirmation: ETT inserted through vocal cords under direct vision, positive ETCO2 and breath sounds checked- equal and bilateral Secured at: 21 cm Tube secured with: Tape Dental Injury: Teeth and Oropharynx as per pre-operative assessment

## 2021-12-10 NOTE — H&P (Signed)
Chief Complaint: Follow-up       History of Present Illness: Catherine Cline is a 66 y.o. female who is seen today as an office consultation at the request of Dr. Pasty Arch for evaluation of Follow-up .   Patient is a 66 year old female who follows back up today secondary to a recurrent incisional hernia. Previously patient did undergo CT scan.  This is approximately 6 to 7 cm incisional hernia.  There appears to be small bowel and colon within the hernia.   Patient states that she was not able to schedule surgery last year secondary to insurance issues.   She states that she feels that the hernia is gotten larger.  She is conscious about the bulge.  She had no signs or symptoms of incarceration or strangulation.       Review of Systems: A complete review of systems was obtained from the patient.  I have reviewed this information and discussed as appropriate with the patient.  See HPI as well for other ROS.   Review of Systems  Constitutional: Negative.   HENT: Negative.    Eyes: Negative.   Respiratory: Negative.    Cardiovascular: Negative.   Gastrointestinal: Negative.   Genitourinary: Negative.   Musculoskeletal: Negative.   Skin: Negative.   Neurological: Negative.   Endo/Heme/Allergies: Negative.   Psychiatric/Behavioral: Negative.         Medical History: Past Medical History History reviewed. No pertinent past medical history.     There is no problem list on file for this patient.     Past Surgical History Past Surgical History: Procedure       Laterality         Date            HERNIA REPAIR                    08/04/2019            JOINT REPLACEMENT         Left                    knee            OOPHORECTOMY                              age 75       Allergies No Known Allergies     Current Outpatient Medications on File Prior to Visit Medication       Sig       Dispense         Refill            albuterol 90 mcg/actuation inhaler     Inhale into the  lungs                             amLODIPine (NORVASC) 10 MG tablet        Take 1 tablet by mouth once daily                               aspirin 81 MG EC tablet         Take by mouth  atorvastatin (LIPITOR) 40 MG tablet  Take 40 mg by mouth once daily                                budesonide-formoteroL (SYMBICORT) 80-4.5 mcg/actuation inhaler          Inhale into the lungs                           dulaglutide (TRULICITY) 1.5 JJ/8.8 mL subcutaneous injection     Inject subcutaneously                                   losartan-hydrochlorothiazide (HYZAAR) 100-12.5 mg tablet Take 1 tablet by mouth once daily                                 metFORMIN (GLUCOPHAGE) 1000 MG tablet        Take by mouth                            No current facility-administered medications on file prior to visit.     Family History History reviewed. No pertinent family history.     Social History   Tobacco Use Smoking Status           Never Smokeless Tobacco   Never     Social History Social History     Socioeconomic History            Marital status:  Unknown Tobacco Use            Smoking status:          Never            Smokeless tobacco:    Never Vaping Use            Vaping Use:    Never used Substance and Sexual Activity            Alcohol use:    Never            Drug use:        Never       Objective:     There were no vitals filed for this visit.  There is no height or weight on file to calculate BMI.   Physical Exam Abdominal:          Hernia Size: 6 cm Incarcerated: Yes Recurrent Hernia   Assessment and Plan: Diagnoses and all orders for this visit:   Recurrent incisional hernia with incarceration     Catherine Cline is a 66 y.o. female    1.          We will proceed to the OR for a robotic versus open incisional hernia repair with mesh. 2.         All risks and benefits were discussed with the patient, to generally include  infection, bleeding, damage to surrounding structures, acute and chronic nerve pain, and recurrence. Alternatives were offered and described.  All questions were answered and the patient voiced understanding of the procedure and wishes to proceed at this point.

## 2021-12-11 ENCOUNTER — Encounter (HOSPITAL_COMMUNITY): Payer: Self-pay | Admitting: General Surgery

## 2021-12-11 DIAGNOSIS — K430 Incisional hernia with obstruction, without gangrene: Secondary | ICD-10-CM | POA: Diagnosis not present

## 2021-12-11 LAB — GLUCOSE, CAPILLARY: Glucose-Capillary: 169 mg/dL — ABNORMAL HIGH (ref 70–99)

## 2021-12-11 MED ORDER — TRAMADOL HCL 50 MG PO TABS: 50.0000 mg | ORAL_TABLET | Freq: Four times a day (QID) | ORAL | 0 refills | Status: DC | PRN

## 2021-12-11 NOTE — Discharge Instructions (Signed)
[facility] _______[facility]  HERNIA REPAIR: POST OP INSTRUCTIONS  Always review your discharge instruction sheet given to you by the facility where your surgery was performed. IF YOU HAVE DISABILITY OR FAMILY LEAVE FORMS, YOU MUST BRING THEM TO THE OFFICE FOR PROCESSING.   DO NOT GIVE THEM TO YOUR DOCTOR.  1. A  prescription for pain medication may be given to you upon discharge.  Take your pain medication as prescribed, if needed.  If narcotic pain medicine is not needed, then you may take acetaminophen (Tylenol) or ibuprofen (Advil) as needed. 2. Take your usually prescribed medications unless otherwise directed. If you need a refill on your pain medication, please contact your pharmacy.  They will contact our office to request authorization. Prescriptions will not be filled after 5 pm or on week-ends. 3. You should follow a light diet the first 24 hours after arrival home, such as soup and crackers, etc.  Be sure to include lots of fluids daily.  Resume your normal diet the day after surgery. 4.Most patients will experience some swelling and bruising around the umbilicus or in the groin and scrotum.  Ice packs and reclining will help.  Swelling and bruising can take several days to resolve.  6. It is common to experience some constipation if taking pain medication after surgery.  Increasing fluid intake and taking a stool softener (such as Colace) will usually help or prevent this problem from occurring.  A mild laxative (Milk of Magnesia or Miralax) should be taken according to package directions if there are no bowel movements after 48 hours. 7. Unless discharge instructions indicate otherwise, you may remove your bandages 24-48 hours after surgery, and you may shower at that time.  You may have steri-strips (small skin tapes) in place directly over the incision.  These strips should be left on the skin for 7-10 days.  If your surgeon used skin glue on the incision, you may shower in  24 hours.  The glue will flake off over the next 2-3 weeks.  Any sutures or staples will be removed at the office during your follow-up visit. 8. ACTIVITIES:  You may resume regular (light) daily activities beginning the next day--such as daily self-care, walking, climbing stairs--gradually increasing activities as tolerated.  You may have sexual intercourse when it is comfortable.  Refrain from any heavy lifting or straining until approved by your doctor.  a.You may drive when you are no longer taking prescription pain medication, you can comfortably wear a seatbelt, and you can safely maneuver your car and apply brakes. b.RETURN TO WORK:   _____________________________________________  9.You should see your doctor in the office for a follow-up appointment approximately 2-3 weeks after your surgery.  Make sure that you call for this appointment within a day or two after you arrive home to insure a convenient appointment time. 10.OTHER INSTRUCTIONS: _________________________    _____________________________________  WHEN TO CALL YOUR DOCTOR: Fever over 101.0 Inability to urinate Nausea and/or vomiting Extreme swelling or bruising Continued bleeding from incision. Increased pain, redness, or drainage from the incision  The clinic staff is available to answer your questions during regular business hours.  Please don't hesitate to call and ask to speak to one of the nurses for clinical concerns.  If you have a medical emergency, go to the nearest emergency room or call 911.  A surgeon from [facility] is always on call at the hospital   [address], [address], [place], [place]    [address] ?  [address] [address], [place], [place]   [address] [phone] ? [phone] ? FAX [phone] Web site: [email or web address]  

## 2021-12-11 NOTE — Discharge Summary (Signed)
Physician Discharge Summary  Patient ID: Catherine Cline MRN: [id number] DOB/AGE: [birth year] 66 y.o.  Admit date: 12/10/2021 Discharge date: 12/11/2021  Admission Diagnoses: Incisional hernia  Discharge Diagnoses:  Principal Problem:   Incisional hernia Active Problems:   S/P hernia repair   Discharged Condition: good  Hospital Course: Patient was admitted postoperatively.  Please see operative note for details. Postoperative patient did well.  She had minimal pain.  She was otherwise ambulating well on her own.  She did have and was tolerating good p.o. Patient otherwise afebrile, deemed stable for discharge and discharged home.  Consults: None  Significant Diagnostic Studies: None  Treatments: surgery: As above  Discharge Exam: Blood pressure (!) 145/80, pulse 88, temperature 98.3 F (36.8 C), temperature source Oral, resp. rate 15, height '5\' 2"'$  (1.575 m), weight 96.6 kg, SpO2 92 %. General appearance: alert and cooperative GI: soft, non-tender; bowel sounds normal; no masses,  no organomegaly and incision clean dry and intact.  Disposition: Discharge disposition: 01-Home or Self Care       Discharge Instructions     Diet - low sodium heart healthy   Complete by: As directed    Increase activity slowly   Complete by: As directed       Allergies as of 12/11/2021   No Known Allergies      Medication List     TAKE these medications    albuterol 108 (90 Base) MCG/ACT inhaler Commonly known as: VENTOLIN HFA Inhale 2 puffs into the lungs every 6 (six) hours as needed for wheezing or shortness of breath.   albuterol (2.5 MG/3ML) 0.083% nebulizer solution Commonly known as: PROVENTIL Take 2.5 mg by nebulization every 6 (six) hours as needed for wheezing or shortness of breath.   amLODipine 10 MG tablet Commonly known as: NORVASC Take 1 tablet by mouth once daily   aspirin EC 81 MG tablet Take 81 mg by mouth every evening.   atorvastatin 40 MG  tablet Commonly known as: LIPITOR Take 1 tablet by mouth once daily   diclofenac Sodium 1 % Gel Commonly known as: VOLTAREN Apply 2 g topically 4 (four) times daily as needed (Knee/Ankle Pain).   Ferrous Sulfate 143 (45 Fe) MG Tbcr Take 143 mg by mouth daily.   fluticasone 50 MCG/ACT nasal spray Commonly known as: FLONASE Place 2 sprays into both nostrils daily. What changed:  when to take this reasons to take this   Krill Oil 1000 MG Caps Take 1,000 mg by mouth daily.   levocetirizine 5 MG tablet Commonly known as: XYZAL TAKE 1 TABLET BY MOUTH ONCE DAILY IN THE EVENING   losartan-hydrochlorothiazide 100-12.5 MG tablet Commonly known as: HYZAAR Take 1 tablet by mouth once daily   metFORMIN 1000 MG tablet Commonly known as: GLUCOPHAGE Take 1 tablet (1,000 mg total) by mouth 2 (two) times daily with a meal.   montelukast 10 MG tablet Commonly known as: SINGULAIR TAKE 1 TABLET BY MOUTH AT BEDTIME   multivitamin tablet Take 1 tablet by mouth daily.   omeprazole 40 MG capsule Commonly known as: PRILOSEC Take 1 capsule (40 mg total) by mouth 2 (two) times daily. For 8 weeks What changed:  when to take this additional instructions   traMADol 50 MG tablet Commonly known as: Ultram Take 1 tablet (50 mg total) by mouth every 6 (six) hours as needed.   Trelegy Ellipta 100-62.5-25 MCG/ACT Aepb Generic drug: Fluticasone-Umeclidin-Vilant Inhale 1 puff into the lungs daily.   Trulicity 1.5 UM/3.5TI Sopn Generic  drug: Dulaglutide INJECT 1.5 MG SUBCUTANEOUSLY ONCE A WEEK What changed: See the new instructions.   vitamin C 1000 MG tablet Take 1,000 mg by mouth daily.   Vitamin D3 125 MCG (5000 UT) Caps Take 5,000 Units by mouth daily.         Signed: Ralene Ok 12/11/2021, 6:43 AM

## 2021-12-11 NOTE — Plan of Care (Signed)
Pt understanding discharge instructions

## 2021-12-13 ENCOUNTER — Other Ambulatory Visit: Payer: Self-pay | Admitting: Emergency Medicine

## 2021-12-17 ENCOUNTER — Other Ambulatory Visit: Payer: Self-pay | Admitting: Emergency Medicine

## 2022-01-06 DIAGNOSIS — K430 Incisional hernia with obstruction, without gangrene: Secondary | ICD-10-CM | POA: Diagnosis not present

## 2022-01-06 DIAGNOSIS — Z9889 Other specified postprocedural states: Secondary | ICD-10-CM | POA: Diagnosis not present

## 2022-01-06 DIAGNOSIS — Z8719 Personal history of other diseases of the digestive system: Secondary | ICD-10-CM | POA: Diagnosis not present

## 2022-01-12 ENCOUNTER — Other Ambulatory Visit: Payer: Self-pay | Admitting: Emergency Medicine

## 2022-01-15 ENCOUNTER — Other Ambulatory Visit: Payer: Self-pay

## 2022-01-20 ENCOUNTER — Telehealth: Payer: Self-pay | Admitting: Emergency Medicine

## 2022-01-20 NOTE — Telephone Encounter (Signed)
Form received placed in provider box awaiting completion and signature

## 2022-01-20 NOTE — Telephone Encounter (Signed)
For our records:  We have received Sugrical Clearance PW for the pt and it has been placed in Dr. Barry Brunner boxes.  Upon completion please fax to:  [phone]

## 2022-01-21 ENCOUNTER — Other Ambulatory Visit: Payer: Self-pay | Admitting: Internal Medicine

## 2022-01-26 ENCOUNTER — Other Ambulatory Visit: Payer: Self-pay | Admitting: Emergency Medicine

## 2022-01-26 DIAGNOSIS — E785 Hyperlipidemia, unspecified: Secondary | ICD-10-CM

## 2022-01-31 ENCOUNTER — Telehealth: Payer: Medicare Other | Admitting: Family Medicine

## 2022-01-31 DIAGNOSIS — U071 COVID-19: Secondary | ICD-10-CM

## 2022-01-31 MED ORDER — ALBUTEROL SULFATE (2.5 MG/3ML) 0.083% IN NEBU: 2.5000 mg | INHALATION_SOLUTION | Freq: Four times a day (QID) | RESPIRATORY_TRACT | 0 refills | Status: AC | PRN

## 2022-01-31 MED ORDER — BENZONATATE 100 MG PO CAPS: 200.0000 mg | ORAL_CAPSULE | Freq: Two times a day (BID) | ORAL | 0 refills | Status: DC | PRN

## 2022-01-31 MED ORDER — PREDNISONE 20 MG PO TABS: 40.0000 mg | ORAL_TABLET | Freq: Every day | ORAL | 0 refills | Status: AC

## 2022-01-31 MED ORDER — NIRMATRELVIR/RITONAVIR (PAXLOVID)TABLET: 3.0000 | ORAL_TABLET | Freq: Two times a day (BID) | ORAL | 0 refills | Status: AC

## 2022-01-31 NOTE — Progress Notes (Signed)
Virtual Visit Consent   Catherine Cline, you are scheduled for a virtual visit with a [facility] provider today. Just as with appointments in the office, your consent must be obtained to participate. Your consent will be active for this visit and any virtual visit you may have with one of our providers in the next 365 days. If you have a MyChart account, a copy of this consent can be sent to you electronically.  As this is a virtual visit, video technology does not allow for your provider to perform a traditional examination. This may limit your provider's ability to fully assess your condition. If your provider identifies any concerns that need to be evaluated in person or the need to arrange testing (such as labs, EKG, etc.), we will make arrangements to do so. Although advances in technology are sophisticated, we cannot ensure that it will always work on either your end or our end. If the connection with a video visit is poor, the visit may have to be switched to a telephone visit. With either a video or telephone visit, we are not always able to ensure that we have a secure connection.  By engaging in this virtual visit, you consent to the provision of healthcare and authorize for your insurance to be billed (if applicable) for the services provided during this visit. Depending on your insurance coverage, you may receive a charge related to this service.  I need to obtain your verbal consent now. Are you willing to proceed with your visit today? Catherine Cline has provided verbal consent on 01/31/2022 for a virtual visit (video or telephone). Perlie Mayo, NP  Date: 01/31/2022 12:32 PM  Virtual Visit via Video Note   I, Perlie Mayo, connected with  Catherine Cline  ([id number], April 30, 1962) on 01/31/22 at 12:30 PM EST by a video-enabled telemedicine application and verified that I am speaking with the correct person using two identifiers.  Location: Patient: Virtual Visit Location Patient:  Home Provider: Virtual Visit Location Provider: Home Office   I discussed the limitations of evaluation and management by telemedicine and the availability of in person appointments. The patient expressed understanding and agreed to proceed.    History of Present Illness: Catherine Cline is a 66 y.o. who identifies as a female who was assigned female at birth, and is being seen today for covid + on a home test. Symptoms started with feverish (has not taken temp) chills, aches and pains, stuffy nose, coughing with some mild wheezing, and mucus. Did not get recent COVID booster. Denies chest pain, shortness of breath at rest.  Problems:  Patient Active Problem List   Diagnosis Date Noted   Incisional hernia 12/10/2021   S/P hernia repair 12/10/2021   Diverticulosis 08/12/2021   History of colonic polyps 08/12/2021   Iron deficiency anemia 08/12/2021   Multiple allergies 05/01/2021   Chronic anemia 05/01/2021   Incisional hernia, without obstruction or gangrene 07/26/2020   Heart murmur 07/26/2020   Body mass index (BMI) of 40.1-44.9 in adult [facility]) 10/12/2019   Moderate persistent asthma with acute exacerbation 07/13/2019   Primary osteoarthritis involving multiple joints 07/14/2018   Chronic joint pain 07/14/2018   Dyslipidemia associated with type 2 diabetes mellitus ([place]) 01/14/2018   Hypertension associated with diabetes ([place]) 07/07/2017   Hyperlipidemia 07/07/2017    Allergies: No Known Allergies Medications:  Current Outpatient Medications:    albuterol (PROVENTIL) (2.5 MG/3ML) 0.083% nebulizer solution, Take 2.5 mg by nebulization every 6 (six)  hours as needed for wheezing or shortness of breath., Disp: , Rfl:    albuterol (VENTOLIN HFA) 108 (90 Base) MCG/ACT inhaler, Inhale 2 puffs into the lungs every 6 (six) hours as needed for wheezing or shortness of breath., Disp: , Rfl:    amLODipine (NORVASC) 10 MG tablet, Take 1 tablet by mouth once daily, Disp: 90 tablet, Rfl: 1    Ascorbic Acid (VITAMIN C) 1000 MG tablet, Take 1,000 mg by mouth daily., Disp: , Rfl:    aspirin EC 81 MG tablet, Take 81 mg by mouth every evening., Disp: , Rfl:    atorvastatin (LIPITOR) 40 MG tablet, Take 1 tablet by mouth once daily, Disp: 90 tablet, Rfl: 0   Cholecalciferol (VITAMIN D3) 5000 units CAPS, Take 5,000 Units by mouth daily., Disp: , Rfl:    diclofenac Sodium (VOLTAREN) 1 % GEL, Apply 2 g topically 4 (four) times daily as needed (Knee/Ankle Pain)., Disp: , Rfl:    Dulaglutide (TRULICITY) 1.5 LP/5.3YY SOPN, INJECT 1.5 MG SUBCUTANEOUSLY ONCE A WEEK (Patient taking differently: Inject 1.5 mg into the skin every Friday.), Disp: 6 mL, Rfl: 3   Ferrous Sulfate 143 (45 Fe) MG TBCR, Take 143 mg by mouth daily., Disp: , Rfl:    fluticasone (FLONASE) 50 MCG/ACT nasal spray, Place 2 sprays into both nostrils daily. (Patient taking differently: Place 2 sprays into both nostrils daily as needed for allergies.), Disp: 18 mL, Rfl: 2   Fluticasone-Umeclidin-Vilant (TRELEGY ELLIPTA) 100-62.5-25 MCG/ACT AEPB, Inhale 1 puff into the lungs daily., Disp: 1 each, Rfl: 11   Krill Oil 1000 MG CAPS, Take 1,000 mg by mouth daily., Disp: , Rfl:    levocetirizine (XYZAL) 5 MG tablet, TAKE 1 TABLET BY MOUTH ONCE DAILY IN THE EVENING, Disp: 30 tablet, Rfl: 0   losartan-hydrochlorothiazide (HYZAAR) 100-12.5 MG tablet, Take 1 tablet by mouth once daily, Disp: 90 tablet, Rfl: 0   metFORMIN (GLUCOPHAGE) 1000 MG tablet, Take 1 tablet (1,000 mg total) by mouth 2 (two) times daily with a meal., Disp: 180 tablet, Rfl: 3   montelukast (SINGULAIR) 10 MG tablet, TAKE 1 TABLET BY MOUTH AT BEDTIME, Disp: 30 tablet, Rfl: 0   Multiple Vitamin (MULTIVITAMIN) tablet, Take 1 tablet by mouth daily., Disp: , Rfl:    omeprazole (PRILOSEC) 40 MG capsule, Take 1 capsule (40 mg total) by mouth 2 (two) times daily. For 8 weeks (Patient taking differently: Take 40 mg by mouth daily.), Disp: 90 capsule, Rfl: 3   traMADol (ULTRAM) 50 MG  tablet, Take 1 tablet (50 mg total) by mouth every 6 (six) hours as needed., Disp: 20 tablet, Rfl: 0  Observations/Objective: Patient is well-developed, well-nourished in no acute distress.  Resting comfortably  at home.  Head is normocephalic, atraumatic.  No labored breathing.  Speech is clear and coherent with logical content.  Patient is alert and oriented at baseline.  Cough present  Assessment and Plan:  1. COVID-19  - nirmatrelvir/ritonavir (PAXLOVID) 20 x 150 MG & 10 x '100MG'$  TABS; Take 3 tablets by mouth 2 (two) times daily for 5 days. (Take nirmatrelvir 150 mg two tablets twice daily for 5 days and ritonavir 100 mg one tablet twice daily for 5 days) Patient GFR is >60  Dispense: 30 tablet; Refill: 0 - predniSONE (DELTASONE) 20 MG tablet; Take 2 tablets (40 mg total) by mouth daily with breakfast for 5 days.  Dispense: 10 tablet; Refill: 0 - benzonatate (TESSALON) 100 MG capsule; Take 2 capsules (200 mg total) by mouth 2 (two)  times daily as needed for cough.  Dispense: 20 capsule; Refill: 0 - albuterol (PROVENTIL) (2.5 MG/3ML) 0.083% nebulizer solution; Take 3 mLs (2.5 mg total) by nebulization every 6 (six) hours as needed for wheezing or shortness of breath.  Dispense: 75 mL; Refill: 0  - Continue OTC symptomatic management of choice - Will send OTC vitamins and supplement information through AVS - Take as prescribed - Patient enrolled in MyChart symptom monitoring - Push fluids - Rest as needed - Discussed return precautions and when to seek in-person evaluation, sent via AVS as well    Follow Up Instructions: I discussed the assessment and treatment plan with the patient. The patient was provided an opportunity to ask questions and all were answered. The patient agreed with the plan and demonstrated an understanding of the instructions.  A copy of instructions were sent to the patient via MyChart unless otherwise noted below.     The patient was advised to call back or  seek an in-person evaluation if the symptoms worsen or if the condition fails to improve as anticipated.  Time:  I spent 10 minutes with the patient via telehealth technology discussing the above problems/concerns.    Perlie Mayo, NP

## 2022-01-31 NOTE — Patient Instructions (Addendum)
Catherine Cline, thank you for joining Perlie Mayo, NP for today's virtual visit.  While this provider is not your primary care provider (PCP), if your PCP is located in our provider database this encounter information will be shared with them immediately following your visit.   [facility] account gives you access to today's visit and all your visits, tests, and labs performed at [facility] " click here if you don't have a [facility] account or go to [email or web address]  Consent: (Patient) Catherine Cline provided verbal consent for this virtual visit at the beginning of the encounter.  Current Medications:  Current Outpatient Medications:    albuterol (PROVENTIL) (2.5 MG/3ML) 0.083% nebulizer solution, Take 2.5 mg by nebulization every 6 (six) hours as needed for wheezing or shortness of breath., Disp: , Rfl:    albuterol (VENTOLIN HFA) 108 (90 Base) MCG/ACT inhaler, Inhale 2 puffs into the lungs every 6 (six) hours as needed for wheezing or shortness of breath., Disp: , Rfl:    amLODipine (NORVASC) 10 MG tablet, Take 1 tablet by mouth once daily, Disp: 90 tablet, Rfl: 1   Ascorbic Acid (VITAMIN C) 1000 MG tablet, Take 1,000 mg by mouth daily., Disp: , Rfl:    aspirin EC 81 MG tablet, Take 81 mg by mouth every evening., Disp: , Rfl:    atorvastatin (LIPITOR) 40 MG tablet, Take 1 tablet by mouth once daily, Disp: 90 tablet, Rfl: 0   Cholecalciferol (VITAMIN D3) 5000 units CAPS, Take 5,000 Units by mouth daily., Disp: , Rfl:    diclofenac Sodium (VOLTAREN) 1 % GEL, Apply 2 g topically 4 (four) times daily as needed (Knee/Ankle Pain)., Disp: , Rfl:    Dulaglutide (TRULICITY) 1.5 TK/1.6WF SOPN, INJECT 1.5 MG SUBCUTANEOUSLY ONCE A WEEK (Patient taking differently: Inject 1.5 mg into the skin every Friday.), Disp: 6 mL, Rfl: 3   Ferrous Sulfate 143 (45 Fe) MG TBCR, Take 143 mg by mouth daily., Disp: , Rfl:    fluticasone (FLONASE) 50 MCG/ACT nasal spray, Place  2 sprays into both nostrils daily. (Patient taking differently: Place 2 sprays into both nostrils daily as needed for allergies.), Disp: 18 mL, Rfl: 2   Fluticasone-Umeclidin-Vilant (TRELEGY ELLIPTA) 100-62.5-25 MCG/ACT AEPB, Inhale 1 puff into the lungs daily., Disp: 1 each, Rfl: 11   Krill Oil 1000 MG CAPS, Take 1,000 mg by mouth daily., Disp: , Rfl:    levocetirizine (XYZAL) 5 MG tablet, TAKE 1 TABLET BY MOUTH ONCE DAILY IN THE EVENING, Disp: 30 tablet, Rfl: 0   losartan-hydrochlorothiazide (HYZAAR) 100-12.5 MG tablet, Take 1 tablet by mouth once daily, Disp: 90 tablet, Rfl: 0   metFORMIN (GLUCOPHAGE) 1000 MG tablet, Take 1 tablet (1,000 mg total) by mouth 2 (two) times daily with a meal., Disp: 180 tablet, Rfl: 3   montelukast (SINGULAIR) 10 MG tablet, TAKE 1 TABLET BY MOUTH AT BEDTIME, Disp: 30 tablet, Rfl: 0   Multiple Vitamin (MULTIVITAMIN) tablet, Take 1 tablet by mouth daily., Disp: , Rfl:    omeprazole (PRILOSEC) 40 MG capsule, Take 1 capsule (40 mg total) by mouth 2 (two) times daily. For 8 weeks (Patient taking differently: Take 40 mg by mouth daily.), Disp: 90 capsule, Rfl: 3   traMADol (ULTRAM) 50 MG tablet, Take 1 tablet (50 mg total) by mouth every 6 (six) hours as needed., Disp: 20 tablet, Rfl: 0   Medications ordered in this encounter:  No orders of the defined types were placed in this encounter.    *  If you need refills on other medications prior to your next appointment, please contact your pharmacy*  Follow-Up: Call back or seek an in-person evaluation if the symptoms worsen or if the condition fails to improve as anticipated.  [place] [phone]  Other Instructions   Please keep well-hydrated and get plenty of rest. Start a saline nasal rinse to flush out your nasal passages. You can use plain Mucinex to help thin congestion. If you have a humidifier, running in the bedroom at night. I want you to start OTC vitamin D3 1000 units daily, vitamin C  1000 mg daily, and a zinc supplement. Please take prescribed medications as directed.  You have been enrolled in a MyChart symptom monitoring program. Please answer these questions daily so we can keep track of how you are doing.  You were to quarantine for 5 days from onset of your symptoms.  After day 5, if you have had no fever and you are feeling better, you can end quarantine but need to mask for an additional 5 days. After day 5 if you have a fever or are having significant symptoms, please quarantine for full 10 days.  If you note any worsening of symptoms, any significant shortness of breath or any chest pain, please seek ER evaluation ASAP.  Please do not delay care!  COVID-19: What to Do if You Are Sick If you test positive and are an older adult or someone who is at high risk of getting very sick from COVID-19, treatment may be available. Contact a healthcare provider right away after a positive test to determine if you are eligible, even if your symptoms are mild right now. You can also visit a Test to Treat location and, if eligible, receive a prescription from a provider. Don't delay: Treatment must be started within the first few days to be effective. If you have a fever, cough, or other symptoms, you might have COVID-19. Most people have mild illness and are able to recover at home. If you are sick: Keep track of your symptoms. If you have an emergency warning sign (including trouble breathing), call 911. Steps to help prevent the spread of COVID-19 if you are sick If you are sick with COVID-19 or think you might have COVID-19, follow the steps below to care for yourself and to help protect other people in your home and community. Stay home except to get medical care Stay home. Most people with COVID-19 have mild illness and can recover at home without medical care. Do not leave your home, except to get medical care. Do not visit public areas and do not go to places where you are  unable to wear a mask. Take care of yourself. Get rest and stay hydrated. Take over-the-counter medicines, such as acetaminophen, to help you feel better. Stay in touch with your doctor. Call before you get medical care. Be sure to get care if you have trouble breathing, or have any other emergency warning signs, or if you think it is an emergency. Avoid public transportation, ride-sharing, or taxis if possible. Get tested If you have symptoms of COVID-19, get tested. While waiting for test results, stay away from others, including staying apart from those living in your household. Get tested as soon as possible after your symptoms start. Treatments may be available for people with COVID-19 who are at risk for becoming very sick. Don't delay: Treatment must be started early to be effective--some treatments must begin within 5 days of  your first symptoms. Contact your healthcare provider right away if your test result is positive to determine if you are eligible. Self-tests are one of several options for testing for the virus that causes COVID-19 and may be more convenient than laboratory-based tests and point-of-care tests. Ask your healthcare provider or your local health department if you need help interpreting your test results. You can visit your state, tribal, local, and territorial health department's website to look for the latest local information on testing sites. Separate yourself from other people As much as possible, stay in a specific room and away from other people and pets in your home. If possible, you should use a separate bathroom. If you need to be around other people or animals in or outside of the home, wear a well-fitting mask. Tell your close contacts that they may have been exposed to COVID-19. An infected person can spread COVID-19 starting 48 hours (or 2 days) before the person has any symptoms or tests positive. By letting your close contacts know they may have been exposed to  COVID-19, you are helping to protect everyone. See COVID-19 and Animals if you have questions about pets. If you are diagnosed with COVID-19, someone from the health department may call you. Answer the call to slow the spread. Monitor your symptoms Symptoms of COVID-19 include fever, cough, or other symptoms. Follow care instructions from your healthcare provider and local health department. Your local health authorities may give instructions on checking your symptoms and reporting information. When to seek emergency medical attention Look for emergency warning signs* for COVID-19. If someone is showing any of these signs, seek emergency medical care immediately: Trouble breathing Persistent pain or pressure in the chest New confusion Inability to wake or stay awake Pale, gray, or blue-colored skin, lips, or nail beds, depending on skin tone *This list is not all possible symptoms. Please call your medical provider for any other symptoms that are severe or concerning to you. Call 911 or call ahead to your local emergency facility: Notify the operator that you are seeking care for someone who has or may have COVID-19. Call ahead before visiting your doctor Call ahead. Many medical visits for routine care are being postponed or done by phone or telemedicine. If you have a medical appointment that cannot be postponed, call your doctor's office, and tell them you have or may have COVID-19. This will help the office protect themselves and other patients. If you are sick, wear a well-fitting mask You should wear a mask if you must be around other people or animals, including pets (even at home). Wear a mask with the best fit, protection, and comfort for you. You don't need to wear the mask if you are alone. If you can't put on a mask (because of trouble breathing, for example), cover your coughs and sneezes in some other way. Try to stay at least 6 feet away from other people. This will help protect  the people around you. Masks should not be placed on young children under age 79 years, anyone who has trouble breathing, or anyone who is not able to remove the mask without help. Cover your coughs and sneezes Cover your mouth and nose with a tissue when you cough or sneeze. Throw away used tissues in a lined trash can. Immediately wash your hands with soap and water for at least 20 seconds. If soap and water are not available, clean your hands with an alcohol-based hand sanitizer that contains at least 60%  alcohol. Clean your hands often Wash your hands often with soap and water for at least 20 seconds. This is especially important after blowing your nose, coughing, or sneezing; going to the bathroom; and before eating or preparing food. Use hand sanitizer if soap and water are not available. Use an alcohol-based hand sanitizer with at least 60% alcohol, covering all surfaces of your hands and rubbing them together until they feel dry. Soap and water are the best option, especially if hands are visibly dirty. Avoid touching your eyes, nose, and mouth with unwashed hands. Handwashing Tips Avoid sharing personal household items Do not share dishes, drinking glasses, cups, eating utensils, towels, or bedding with other people in your home. Wash these items thoroughly after using them with soap and water or put in the dishwasher. Clean surfaces in your home regularly Clean and disinfect high-touch surfaces (for example, doorknobs, tables, handles, light switches, and countertops) in your "sick room" and bathroom. In shared spaces, you should clean and disinfect surfaces and items after each use by the person who is ill. If you are sick and cannot clean, a caregiver or other person should only clean and disinfect the area around you (such as your bedroom and bathroom) on an as needed basis. Your caregiver/other person should wait as long as possible (at least several hours) and wear a mask before  entering, cleaning, and disinfecting shared spaces that you use. Clean and disinfect areas that may have blood, stool, or body fluids on them. Use household cleaners and disinfectants. Clean visible dirty surfaces with household cleaners containing soap or detergent. Then, use a household disinfectant. Use a product from [employer] List N: Disinfectants for Coronavirus (ZHGDJ-24). Be sure to follow the instructions on the label to ensure safe and effective use of the product. Many products recommend keeping the surface wet with a disinfectant for a certain period of time (look at "contact time" on the product label). You may also need to wear personal protective equipment, such as gloves, depending on the directions on the product label. Immediately after disinfecting, wash your hands with soap and water for 20 seconds. For completed guidance on cleaning and disinfecting your home, visit Complete Disinfection Guidance. Take steps to improve ventilation at home Improve ventilation (air flow) at home to help prevent from spreading COVID-19 to other people in your household. Clear out COVID-19 virus particles in the air by opening windows, using air filters, and turning on fans in your home. Use this interactive tool to learn how to improve air flow in your home. When you can be around others after being sick with COVID-19 Deciding when you can be around others is different for different situations. Find out when you can safely end home isolation. For any additional questions about your care, contact your healthcare provider or state or local health department. 04/24/2020 Content source: [facility] for Immunization and Respiratory Diseases ([facility]), Division of Viral Diseases This information is not intended to replace advice given to you by your health care provider. Make sure you discuss any questions you have with your health care provider. Document Revised: 06/07/2020 Document Reviewed:  06/07/2020 Elsevier Patient Education  2022 [employer].      If you have been instructed to have an in-person evaluation today at a local Urgent Care facility, please use the link below. It will take you to a list of all of our available [facility], including address, phone number and hours of operation. Please do not delay care.  [facility]  If you or a family member do not have a primary care provider, use the link below to schedule a visit and establish care. When you choose a [facility] primary care physician or advanced practice provider, you gain a long-term partner in health. Find a Primary Care Provider  Learn more about [facility]'s in-office and virtual care options: [facility]

## 2022-02-08 ENCOUNTER — Other Ambulatory Visit: Payer: Self-pay | Admitting: Emergency Medicine

## 2022-02-10 ENCOUNTER — Ambulatory Visit: Payer: Medicaid Other | Admitting: Emergency Medicine

## 2022-02-10 DIAGNOSIS — H5213 Myopia, bilateral: Secondary | ICD-10-CM | POA: Diagnosis not present

## 2022-02-10 DIAGNOSIS — E119 Type 2 diabetes mellitus without complications: Secondary | ICD-10-CM | POA: Diagnosis not present

## 2022-02-10 LAB — HM DIABETES EYE EXAM

## 2022-02-12 ENCOUNTER — Encounter: Payer: Self-pay | Admitting: Emergency Medicine

## 2022-02-12 ENCOUNTER — Ambulatory Visit (INDEPENDENT_AMBULATORY_CARE_PROVIDER_SITE_OTHER): Payer: Medicare Other | Admitting: Emergency Medicine

## 2022-02-12 VITALS — BP 128/84 | HR 91 | Temp 97.9°F | Ht 62.0 in | Wt 204.4 lb

## 2022-02-12 DIAGNOSIS — E785 Hyperlipidemia, unspecified: Secondary | ICD-10-CM | POA: Diagnosis not present

## 2022-02-12 DIAGNOSIS — E1169 Type 2 diabetes mellitus with other specified complication: Secondary | ICD-10-CM

## 2022-02-12 DIAGNOSIS — I152 Hypertension secondary to endocrine disorders: Secondary | ICD-10-CM | POA: Diagnosis not present

## 2022-02-12 DIAGNOSIS — Z1231 Encounter for screening mammogram for malignant neoplasm of breast: Secondary | ICD-10-CM

## 2022-02-12 DIAGNOSIS — E1159 Type 2 diabetes mellitus with other circulatory complications: Secondary | ICD-10-CM

## 2022-02-12 LAB — POCT GLYCOSYLATED HEMOGLOBIN (HGB A1C): Hemoglobin A1C: 6.8 % — AB (ref 4.0–5.6)

## 2022-02-12 MED ORDER — TRULICITY 1.5 MG/0.5ML ~~LOC~~ SOAJ: 1.5000 mg | SUBCUTANEOUS | 3 refills | Status: DC

## 2022-02-12 MED ORDER — LOSARTAN POTASSIUM-HCTZ 100-12.5 MG PO TABS: 1.0000 | ORAL_TABLET | Freq: Every day | ORAL | 3 refills | Status: DC

## 2022-02-12 MED ORDER — FLUTICASONE PROPIONATE 50 MCG/ACT NA SUSP: 2.0000 | Freq: Every day | NASAL | 2 refills | Status: AC

## 2022-02-12 NOTE — Progress Notes (Signed)
Catherine Cline 67 y.o.   Chief Complaint  Patient presents with   Follow-up    65mth f/u appt, no concerns     HISTORY OF PRESENT ILLNESS: This is a 67y.o. female here for 361-monthollow-up of diabetes and hypertension. Overall doing well.  Has no complaints or medical concerns today.  HPI   Prior to Admission medications   Medication Sig Start Date End Date Taking? Authorizing Provider  albuterol (PROVENTIL) (2.5 MG/3ML) 0.083% nebulizer solution Take 3 mLs (2.5 mg total) by nebulization every 6 (six) hours as needed for wheezing or shortness of breath. 01/31/22  Yes MiPerlie MayoNP  amLODipine (NORVASC) 10 MG tablet Take 1 tablet by mouth once daily 10/04/21  Yes Jennings Stirling, MiInes BloomerMD  Ascorbic Acid (VITAMIN C) 1000 MG tablet Take 1,000 mg by mouth daily.   Yes [provider]  aspirin EC 81 MG tablet Take 81 mg by mouth every evening.   Yes [provider]  atorvastatin (LIPITOR) 40 MG tablet Take 1 tablet by mouth once daily 01/26/22  Yes Ethelda Deangelo, MiInes BloomerMD  benzonatate (TESSALON) 100 MG capsule Take 2 capsules (200 mg total) by mouth 2 (two) times daily as needed for cough. 01/31/22  Yes MiPerlie MayoNP  Cholecalciferol (VITAMIN D3) 5000 units CAPS Take 5,000 Units by mouth daily.   Yes [provider]  diclofenac Sodium (VOLTAREN) 1 % GEL Apply 2 g topically 4 (four) times daily as needed (Knee/Ankle Pain).   Yes [provider]  Dulaglutide (TRULICITY) 1.5 MGVO/3.5KKOPN INJECT 1.5 MG SUBCUTANEOUSLY ONCE A WEEK Patient taking differently: Inject 1.5 mg into the skin every Friday. 09/18/21  Yes Jacynda Brunke, MiInes BloomerMD  Ferrous Sulfate 143 (45 Fe) MG TBCR Take 143 mg by mouth daily.   Yes [provider]  fluticasone (FLONASE) 50 MCG/ACT nasal spray Place 2 sprays into both nostrils daily. Patient taking differently: Place 2 sprays into both nostrils daily as needed for allergies. 08/12/21  Yes Juliya Magill, MiInes BloomerMD   Fluticasone-Umeclidin-Vilant (TRELEGY ELLIPTA) 100-62.5-25 MCG/ACT AEPB Inhale 1 puff into the lungs daily. 08/12/21  Yes Jerrianne Hartin, MiInes BloomerMD  Krill Oil 1000 MG CAPS Take 1,000 mg by mouth daily.   Yes [provider]  levocetirizine (XYZAL) 5 MG tablet TAKE 1 TABLET BY MOUTH ONCE DAILY IN THE EVENING 02/08/22  Yes Temple Sporer, MiInes BloomerMD  losartan-hydrochlorothiazide ([facility]100-12.5 MG tablet Take 1 tablet by mouth once daily 11/02/21  Yes Modena Bellemare, MiInes BloomerMD  metFORMIN (GLUCOPHAGE) 1000 MG tablet Take 1 tablet (1,000 mg total) by mouth 2 (two) times daily with a meal. 08/12/21 08/07/22 Yes Dekari Bures, MiInes BloomerMD  montelukast (SINGULAIR) 10 MG tablet TAKE 1 TABLET BY MOUTH AT BEDTIME 02/08/22  Yes Mahlani Berninger, MiInes BloomerMD  Multiple Vitamin (MULTIVITAMIN) tablet Take 1 tablet by mouth daily.   Yes [provider]  omeprazole (PRILOSEC) 40 MG capsule Take 1 capsule (40 mg total) by mouth 2 (two) times daily. For 8 weeks Patient taking differently: Take 40 mg by mouth daily. 08/01/21  Yes DoSharyn CreamerMD    No Known Allergies  Patient Active Problem List   Diagnosis Date Noted   Incisional hernia 12/10/2021   S/P hernia repair 12/10/2021   Diverticulosis 08/12/2021   History of colonic polyps 08/12/2021   Iron deficiency anemia 08/12/2021   Multiple allergies 05/01/2021   Chronic anemia 05/01/2021   Incisional hernia, without obstruction or gangrene 07/26/2020   Heart  murmur 07/26/2020   Body mass index (BMI) of 40.1-44.9 in adult ([place]) 10/12/2019   Moderate persistent asthma with acute exacerbation 07/13/2019   Primary osteoarthritis involving multiple joints 07/14/2018   Chronic joint pain 07/14/2018   Dyslipidemia associated with type 2 diabetes mellitus ([place]) 01/14/2018   Hypertension associated with diabetes ([place]) 07/07/2017   Hyperlipidemia 07/07/2017    Past Medical History:  Diagnosis Date   Anemia    Asthma    Diabetes ([place])     Hyperlipidemia    Hypertension    Obesity    osteoarthritis     Past Surgical History:  Procedure Laterality Date   COLONOSCOPY     HERNIA REPAIR     INSERTION OF MESH N/A 12/10/2021   Procedure: INSERTION OF MESH;  Surgeon: Ralene Ok, MD;  Location: [place];  Service: General;  Laterality: N/A;   OVARIAN CYST REMOVAL     PARTIAL KNEE ARTHROPLASTY Left 2021   UPPER GASTROINTESTINAL ENDOSCOPY     XI ROBOTIC ASSISTED VENTRAL HERNIA N/A 12/10/2021   Procedure: ROBOTIC [place];  Surgeon: Ralene Ok, MD;  Location: [place];  Service: General;  Laterality: N/A;    Social History   Socioeconomic History   Marital status: Single    Spouse name: Not on file   Number of children: 0   Years of education: Not on file   Highest education level: Not on file  Occupational History   Occupation: Retired  Tobacco Use   Smoking status: Never   Smokeless tobacco: Never  Vaping Use   Vaping Use: Never used  Substance and Sexual Activity   Alcohol use: Not Currently    Comment: socially   Drug use: Never   Sexual activity: Not on file  Other Topics Concern   Not on file  Social History Narrative   Not on file   Social Determinants of Health   Financial Resource Strain: Not on file  Food Insecurity: Not on file  Transportation Needs: Not on file  Physical Activity: Not on file  Stress: Not on file  Social Connections: Not on file  Intimate Partner Violence: Not on file    Family History  Problem Relation Age of Onset   Heart disease Mother        stent   Hypertension Mother    Stroke Mother    Diabetes Sister    Hypertension Sister    Ulcerative colitis Sister    Colon cancer Neg Hx    Stomach cancer Neg Hx    Esophageal cancer Neg Hx    Rectal cancer Neg Hx      Review of Systems  Constitutional: Negative.  Negative for chills and fever.  HENT: Negative.  Negative for congestion and sore throat.   Respiratory: Negative.  Negative  for cough and shortness of breath.   Cardiovascular: Negative.  Negative for chest pain and palpitations.  Gastrointestinal:  Negative for nausea and vomiting.  Genitourinary: Negative.   Skin: Negative.  Negative for rash.  Neurological:  Negative for dizziness and headaches.  All other systems reviewed and are negative.   Wt Readings from Last 3 Encounters:  02/12/22 204 lb 6 oz (92.7 kg)  12/10/21 213 lb (96.6 kg)  12/02/21 213 lb 3.2 oz (96.7 kg)   Today's Vitals   02/12/22 0817  Weight: 204 lb 6 oz (92.7 kg)  Height: '5\' 2"'$  (1.575 m)   Body mass index is 37.38 kg/m.  Physical Exam Vitals reviewed.  Constitutional:      Appearance: Normal appearance.  HENT:     Head: Normocephalic.     Mouth/Throat:     Mouth: Mucous membranes are moist.     Pharynx: Oropharynx is clear.  Eyes:     Extraocular Movements: Extraocular movements intact.     Pupils: Pupils are equal, round, and reactive to light.  Cardiovascular:     Rate and Rhythm: Normal rate and regular rhythm.     Pulses: Normal pulses.     Heart sounds: Normal heart sounds.  Pulmonary:     Effort: Pulmonary effort is normal.     Breath sounds: Normal breath sounds.  Skin:    General: Skin is warm and dry.  Neurological:     Mental Status: She is alert and oriented to person, place, and time.  Psychiatric:        Mood and Affect: Mood normal.        Behavior: Behavior normal.     Results for orders placed or performed in visit on 02/12/22 (from the past 24 hour(s))  POCT HgB A1C     Status: Abnormal   Collection Time: 02/12/22  8:50 AM  Result Value Ref Range   Hemoglobin A1C 6.8 (A) 4.0 - 5.6 %   HbA1c POC (<> result, manual entry)     HbA1c, POC (prediabetic range)     HbA1c, POC (controlled diabetic range)      ASSESSMENT & PLAN: A total of 44 minutes was spent with the patient and counseling/coordination of care regarding preparing for this visit, review of most recent office visit notes, review  of most recent blood work results including interpretation of today's hemoglobin A1c, review of chronic medical conditions under management, review of all medications, education on nutrition, cardiovascular risks associated with hypertension and diabetes, prognosis, documentation, need for follow-up in 3 months.  Problem List Items Addressed This Visit       Cardiovascular and Mediastinum   Hypertension associated with diabetes ([place])    Well-controlled hypertension. Continue Hyzaar 100-12.5 mg daily and amlodipine 10 mg daily Well-controlled diabetes with hemoglobin A1c of 6.8. Continue Trulicity 1.5 mg weekly and metformin 1000 mg twice a day. Cardiovascular risks associated with hypertension and diabetes discussed. Diet and nutrition discussed. Follow-up in 3 months as per patient request.      Relevant Medications   Dulaglutide (TRULICITY) 1.5 PP/2.9JJ SOPN   losartan-hydrochlorothiazide (HYZAAR) 100-12.5 MG tablet   Other Relevant Orders   POCT HgB A1C     Endocrine   Dyslipidemia associated with type 2 diabetes mellitus ([place]) - Primary    Stable.  Diet and nutrition discussed. Continue atorvastatin 40 mg daily.       Relevant Medications   Dulaglutide (TRULICITY) 1.5 OA/4.1YS SOPN   losartan-hydrochlorothiazide (HYZAAR) 100-12.5 MG tablet   Other Visit Diagnoses     Encounter for screening mammogram for malignant neoplasm of breast       Relevant Orders   MM Digital Screening      Patient Instructions  Diabetes Mellitus and Nutrition, Adult When you have diabetes, or diabetes mellitus, it is very important to have healthy eating habits because your blood sugar (glucose) levels are greatly affected by what you eat and drink. Eating healthy foods in the right amounts, at about the same times every day, can help you: Manage your blood glucose. Lower your risk of heart disease. Improve your blood pressure. Reach or maintain a healthy weight. What can affect my meal  plan?  Every person with diabetes is different, and each person has different needs for a meal plan. Your health care provider may recommend that you work with a dietitian to make a meal plan that is best for you. Your meal plan may vary depending on factors such as: The calories you need. The medicines you take. Your weight. Your blood glucose, blood pressure, and cholesterol levels. Your activity level. Other health conditions you have, such as heart or kidney disease. How do carbohydrates affect me? Carbohydrates, also called carbs, affect your blood glucose level more than any other type of food. Eating carbs raises the amount of glucose in your blood. It is important to know how many carbs you can safely have in each meal. This is different for every person. Your dietitian can help you calculate how many carbs you should have at each meal and for each snack. How does alcohol affect me? Alcohol can cause a decrease in blood glucose (hypoglycemia), especially if you use insulin or take certain diabetes medicines by mouth. Hypoglycemia can be a life-threatening condition. Symptoms of hypoglycemia, such as sleepiness, dizziness, and confusion, are similar to symptoms of having too much alcohol. Do not drink alcohol if: Your health care provider tells you not to drink. You are pregnant, may be pregnant, or are planning to become pregnant. If you drink alcohol: Limit how much you have to: 0-1 drink a day for women. 0-2 drinks a day for men. Know how much alcohol is in your drink. In the [place] one drink equals one 12 oz bottle of beer (355 mL), one 5 oz glass of wine (148 mL), or one 1 oz glass of hard liquor (44 mL). Keep yourself hydrated with water, diet soda, or unsweetened iced tea. Keep in mind that regular soda, juice, and other mixers may contain a lot of sugar and must be counted as carbs. What are tips for following this plan?  Reading food labels Start by checking the serving size  on the Nutrition Facts label of packaged foods and drinks. The number of calories and the amount of carbs, fats, and other nutrients listed on the label are based on one serving of the item. Many items contain more than one serving per package. Check the total grams (g) of carbs in one serving. Check the number of grams of saturated fats and trans fats in one serving. Choose foods that have a low amount or none of these fats. Check the number of milligrams (mg) of salt (sodium) in one serving. Most people should limit total sodium intake to less than 2,300 mg per day. Always check the nutrition information of foods labeled as "low-fat" or "nonfat." These foods may be higher in added sugar or refined carbs and should be avoided. Talk to your dietitian to identify your daily goals for nutrients listed on the label. Shopping Avoid buying canned, pre-made, or processed foods. These foods tend to be high in fat, sodium, and added sugar. Shop around the outside edge of the grocery store. This is where you will most often find fresh fruits and vegetables, bulk grains, fresh meats, and fresh dairy products. Cooking Use low-heat cooking methods, such as baking, instead of high-heat cooking methods, such as deep frying. Cook using healthy oils, such as olive, canola, or sunflower oil. Avoid cooking with butter, cream, or high-fat meats. Meal planning Eat meals and snacks regularly, preferably at the same times every day. Avoid going long periods of time without eating. Eat foods that are high  in fiber, such as fresh fruits, vegetables, beans, and whole grains. Eat 4-6 oz (112-168 g) of lean protein each day, such as lean meat, chicken, fish, eggs, or tofu. One ounce (oz) (28 g) of lean protein is equal to: 1 oz (28 g) of meat, chicken, or fish. 1 egg.  cup (62 g) of tofu. Eat some foods each day that contain healthy fats, such as avocado, nuts, seeds, and fish. What foods should I eat? Fruits Berries.  Apples. Oranges. Peaches. Apricots. Plums. Grapes. Mangoes. Papayas. Pomegranates. Kiwi. Cherries. Vegetables Leafy greens, including lettuce, spinach, kale, chard, collard greens, mustard greens, and cabbage. Beets. Cauliflower. Broccoli. Carrots. Green beans. Tomatoes. Peppers. Onions. Cucumbers. Brussels sprouts. Grains Whole grains, such as whole-wheat or whole-grain bread, crackers, tortillas, cereal, and pasta. Unsweetened oatmeal. Quinoa. Brown or wild rice. Meats and other proteins Seafood. Poultry without skin. Lean cuts of poultry and beef. Tofu. Nuts. Seeds. Dairy Low-fat or fat-free dairy products such as milk, yogurt, and cheese. The items listed above may not be a complete list of foods and beverages you can eat and drink. Contact a dietitian for more information. What foods should I avoid? Fruits Fruits canned with syrup. Vegetables Canned vegetables. Frozen vegetables with butter or cream sauce. Grains Refined white flour and flour products such as bread, pasta, snack foods, and cereals. Avoid all processed foods. Meats and other proteins Fatty cuts of meat. Poultry with skin. Breaded or fried meats. Processed meat. Avoid saturated fats. Dairy Full-fat yogurt, cheese, or milk. Beverages Sweetened drinks, such as soda or iced tea. The items listed above may not be a complete list of foods and beverages you should avoid. Contact a dietitian for more information. Questions to ask a health care provider Do I need to meet with a certified diabetes care and education specialist? Do I need to meet with a dietitian? What number can I call if I have questions? When are the best times to check my blood glucose? Where to find more information: American Diabetes Association: diabetes.org Academy of Nutrition and Dietetics: eatright.[employer] of Diabetes and Digestive and Kidney Diseases: [employer].is Association of Diabetes Care & Education Specialists:  diabeteseducator.org Summary It is important to have healthy eating habits because your blood sugar (glucose) levels are greatly affected by what you eat and drink. It is important to use alcohol carefully. A healthy meal plan will help you manage your blood glucose and lower your risk of heart disease. Your health care provider may recommend that you work with a dietitian to make a meal plan that is best for you. This information is not intended to replace advice given to you by your health care provider. Make sure you discuss any questions you have with your health care provider. Document Revised: 08/24/2019 Document Reviewed: 08/24/2019 Elsevier Patient Education  [place], MD [facility]

## 2022-02-12 NOTE — Patient Instructions (Signed)

## 2022-02-12 NOTE — Assessment & Plan Note (Signed)
Stable.  Diet and nutrition discussed. Continue atorvastatin 40 mg daily. 

## 2022-02-12 NOTE — Assessment & Plan Note (Signed)
Well-controlled hypertension. Continue Hyzaar 100-12.5 mg daily and amlodipine 10 mg daily Well-controlled diabetes with hemoglobin A1c of 6.8. Continue Trulicity 1.5 mg weekly and metformin 1000 mg twice a day. Cardiovascular risks associated with hypertension and diabetes discussed. Diet and nutrition discussed. Follow-up in 3 months as per patient request.

## 2022-02-21 ENCOUNTER — Ambulatory Visit
Admission: RE | Admit: 2022-02-21 | Discharge: 2022-02-21 | Disposition: A | Discharge status: Home / Self Care | Payer: 59 | Source: Ambulatory Visit | Attending: Emergency Medicine | Admitting: Emergency Medicine

## 2022-02-21 ENCOUNTER — Other Ambulatory Visit: Payer: Self-pay | Admitting: Emergency Medicine

## 2022-02-21 DIAGNOSIS — Z1231 Encounter for screening mammogram for malignant neoplasm of breast: Secondary | ICD-10-CM

## 2022-02-21 DIAGNOSIS — E1159 Type 2 diabetes mellitus with other circulatory complications: Secondary | ICD-10-CM

## 2022-02-21 DIAGNOSIS — E1169 Type 2 diabetes mellitus with other specified complication: Secondary | ICD-10-CM

## 2022-02-21 DIAGNOSIS — I152 Hypertension secondary to endocrine disorders: Secondary | ICD-10-CM

## 2022-03-07 ENCOUNTER — Other Ambulatory Visit: Payer: Self-pay | Admitting: Emergency Medicine

## 2022-03-28 ENCOUNTER — Other Ambulatory Visit: Payer: Self-pay | Admitting: Emergency Medicine

## 2022-03-28 DIAGNOSIS — I152 Hypertension secondary to endocrine disorders: Secondary | ICD-10-CM

## 2022-04-03 ENCOUNTER — Other Ambulatory Visit: Payer: Self-pay | Admitting: Emergency Medicine

## 2022-04-03 ENCOUNTER — Telehealth: Payer: Self-pay | Admitting: Emergency Medicine

## 2022-04-03 DIAGNOSIS — I152 Hypertension secondary to endocrine disorders: Secondary | ICD-10-CM

## 2022-04-03 DIAGNOSIS — E1159 Type 2 diabetes mellitus with other circulatory complications: Secondary | ICD-10-CM

## 2022-04-03 MED ORDER — TIRZEPATIDE 10 MG/0.5ML ~~LOC~~ SOAJ: 10.0000 mg | SUBCUTANEOUS | 5 refills | Status: DC

## 2022-04-03 NOTE — Telephone Encounter (Signed)
Patient said her pharmacy is out of Dulaglutide (TRULICITY) 1.5 0000000 SOPN and she's checked with other pharmacies to no avail. She would like to know if a different medication can be sent in to replace it temporarily. It would need to be sent to [place] [place]. She is due for her next dose tomorrow 04/04/22.  Best callback number for patient is [phone].

## 2022-04-03 NOTE — Telephone Encounter (Signed)
Patient called back and states that on the Trulicity - her pharmacy has 0.75 and the 3.0  Mounjaro - they have 12.5  Please advise  Please call patient back - [phone]

## 2022-04-03 NOTE — Telephone Encounter (Signed)
New prescription for Mounjaro 10 mg sent to pharmacy of record.  Hope it's covered by insurance.  Thanks.

## 2022-04-04 MED ORDER — TIRZEPATIDE 12.5 MG/0.5ML ~~LOC~~ SOAJ: 12.5000 mg | SUBCUTANEOUS | 2 refills | Status: DC

## 2022-04-04 NOTE — Telephone Encounter (Signed)
New medication sent to patient pharmacy

## 2022-04-18 ENCOUNTER — Ambulatory Visit: Payer: 59 | Attending: Internal Medicine | Admitting: Internal Medicine

## 2022-04-18 ENCOUNTER — Encounter: Payer: Self-pay | Admitting: Internal Medicine

## 2022-04-18 VITALS — BP 135/83 | HR 79 | Ht 62.0 in | Wt 201.0 lb

## 2022-04-18 DIAGNOSIS — R011 Cardiac murmur, unspecified: Secondary | ICD-10-CM | POA: Diagnosis not present

## 2022-04-18 DIAGNOSIS — E1159 Type 2 diabetes mellitus with other circulatory complications: Secondary | ICD-10-CM

## 2022-04-18 DIAGNOSIS — M159 Polyosteoarthritis, unspecified: Secondary | ICD-10-CM | POA: Diagnosis not present

## 2022-04-18 DIAGNOSIS — R9431 Abnormal electrocardiogram [ECG] [EKG]: Secondary | ICD-10-CM | POA: Diagnosis not present

## 2022-04-18 DIAGNOSIS — E785 Hyperlipidemia, unspecified: Secondary | ICD-10-CM

## 2022-04-18 DIAGNOSIS — I152 Hypertension secondary to endocrine disorders: Secondary | ICD-10-CM

## 2022-04-18 DIAGNOSIS — M150 Primary generalized (osteo)arthritis: Secondary | ICD-10-CM

## 2022-04-18 DIAGNOSIS — I447 Left bundle-branch block, unspecified: Secondary | ICD-10-CM | POA: Diagnosis not present

## 2022-04-18 DIAGNOSIS — R0609 Other forms of dyspnea: Secondary | ICD-10-CM | POA: Diagnosis not present

## 2022-04-18 DIAGNOSIS — K432 Incisional hernia without obstruction or gangrene: Secondary | ICD-10-CM

## 2022-04-18 NOTE — Progress Notes (Signed)
Cardiology Office Note:    Date:  04/18/2022   ID:  ZAYDIE IRONS, DOB [birth year], MRN [id number]  PCP:  Horald Pollen, MD  Cardiologist:  Elouise Munroe, MD  Electrophysiologist:  None   Referring MD: Horald Pollen, *   Chief Complaint/Reason for Referral: Heart Murmur, dyspnea on exertion  History of Present Illness:    Catherine Cline is a 67 y.o. female with a history of arthritis, asthma, diabetes, and hypertension here for f/u.  Doing really well. Has had weight loss. Taking care of her mother.  Mostly struggling with right knee and right shoulder arthritis.  Has had a prior left knee replacement but had a lot of pain with the postsurgical rehab.  Hesitant to go for right knee replacement.  Continues to lose weight, she is on Mounjaro.  No chest pain or shortness of breath at rest.  Mild shortness of breath when climbing hills but has not done so during the winter, she will resume her activity and call with any concerns.  The patient denies chest pain, chest pressure, dyspnea at rest or with exertion, palpitations, PND, orthopnea, or leg swelling. Denies cough, fever, chills. Denies nausea, vomiting. Denies syncope or presyncope. Denies dizziness or lightheadedness. Possible snoring.   Past Medical History:  Diagnosis Date   Anemia    Asthma    Diabetes ([place])    Hyperlipidemia    Hypertension    Obesity    osteoarthritis     Past Surgical History:  Procedure Laterality Date   COLONOSCOPY     HERNIA REPAIR     INSERTION OF MESH N/A 12/10/2021   Procedure: INSERTION OF MESH;  Surgeon: Ralene Ok, MD;  Location: [facility];  Service: General;  Laterality: N/A;   OVARIAN CYST REMOVAL     PARTIAL KNEE ARTHROPLASTY Left 2021   UPPER GASTROINTESTINAL ENDOSCOPY     XI ROBOTIC ASSISTED VENTRAL HERNIA N/A 12/10/2021   Procedure: ROBOTIC INCISIONAL HERNIA REPAIR WITH MESH;  Surgeon: Ralene Ok, MD;  Location: [facility];  Service: General;  Laterality: N/A;     Current Medications: Current Meds  Medication Sig   albuterol (PROVENTIL) (2.5 MG/3ML) 0.083% nebulizer solution Take 3 mLs (2.5 mg total) by nebulization every 6 (six) hours as needed for wheezing or shortness of breath.   amLODipine (NORVASC) 10 MG tablet Take 1 tablet by mouth once daily   Ascorbic Acid (VITAMIN C) 1000 MG tablet Take 1,000 mg by mouth daily.   aspirin EC 81 MG tablet Take 81 mg by mouth every evening.   atorvastatin (LIPITOR) 40 MG tablet Take 1 tablet by mouth once daily   benzonatate (TESSALON) 100 MG capsule Take 2 capsules (200 mg total) by mouth 2 (two) times daily as needed for cough.   Cholecalciferol (VITAMIN D3) 5000 units CAPS Take 5,000 Units by mouth daily.   diclofenac Sodium (VOLTAREN) 1 % GEL Apply 2 g topically 4 (four) times daily as needed (Knee/Ankle Pain).   Ferrous Sulfate 143 (45 Fe) MG TBCR Take 143 mg by mouth daily.   fluticasone (FLONASE) 50 MCG/ACT nasal spray Place 2 sprays into both nostrils daily.   Fluticasone-Umeclidin-Vilant (TRELEGY ELLIPTA) 100-62.5-25 MCG/ACT AEPB Inhale 1 puff into the lungs daily.   Krill Oil 1000 MG CAPS Take 1,000 mg by mouth daily.   levocetirizine (XYZAL) 5 MG tablet TAKE 1 TABLET BY MOUTH ONCE DAILY IN THE EVENING   losartan-hydrochlorothiazide (HYZAAR) 100-12.5 MG tablet Take 1 tablet by mouth daily.  metFORMIN (GLUCOPHAGE) 1000 MG tablet Take 1 tablet (1,000 mg total) by mouth 2 (two) times daily with a meal.   montelukast (SINGULAIR) 10 MG tablet TAKE 1 TABLET BY MOUTH AT BEDTIME   Multiple Vitamin (MULTIVITAMIN) tablet Take 1 tablet by mouth daily.   omeprazole (PRILOSEC) 40 MG capsule Take 1 capsule (40 mg total) by mouth 2 (two) times daily. For 8 weeks   tirzepatide (MOUNJARO) 12.5 MG/0.5ML Pen Inject 12.5 mg into the skin once a week.     Allergies:   Patient has no known allergies.   Social History   Tobacco Use   Smoking status: Never   Smokeless tobacco: Never  Vaping Use   Vaping Use:  Never used  Substance Use Topics   Alcohol use: Not Currently    Comment: socially   Drug use: Never     Family History: The patient's family history includes Diabetes in her sister; Heart disease in her mother; Hypertension in her mother and sister; Stroke in her mother; Ulcerative colitis in her sister. There is no history of Colon cancer, Stomach cancer, Esophageal cancer, Rectal cancer, or Breast cancer.  ROS:   Please see the history of present illness.     All other systems reviewed and are negative.  EKGs/Labs/Other Studies Reviewed:    The following studies were reviewed today: No prior CV studies available.  EKG:  04/18/22: NSR, inferior infarct pattern, T wave abnormality anterior. 10/30/21: NSR, nonspecific ST abnl. QRS duration 82 ms, no LBBB 07/31/2020: Sinus rhythm, LBBB, rate 90 bpm, QRS duration of 140 ms  Recent Labs: 05/01/2021: ALT 59 12/02/2021: BUN 28; Creatinine, Ser 0.90; Hemoglobin 11.7; Platelets 360; Potassium 4.5; Sodium 140  Recent Lipid Panel    Component Value Date/Time   CHOL 129 05/01/2021 0933   CHOL 149 10/12/2019 0941   TRIG 133.0 05/01/2021 0933   HDL 41.30 05/01/2021 0933   HDL 46 10/12/2019 0941   CHOLHDL 3 05/01/2021 0933   VLDL 26.6 05/01/2021 0933   LDLCALC 61 05/01/2021 0933   LDLCALC 80 10/12/2019 0941    Physical Exam:    VS:  BP 135/83 (BP Location: Left Arm, Patient Position: Sitting, Cuff Size: Normal)   Pulse 79   Ht 5\' 2"  (1.575 m)   Wt 201 lb (91.2 kg)   BMI 36.76 kg/m     Wt Readings from Last 5 Encounters:  04/18/22 201 lb (91.2 kg)  02/12/22 204 lb 6 oz (92.7 kg)  12/10/21 213 lb (96.6 kg)  12/02/21 213 lb 3.2 oz (96.7 kg)  11/06/21 213 lb 6 oz (96.8 kg)    Constitutional: No acute distress Eyes: sclera non-icteric, normal conjunctiva and lids ENMT: normal dentition, moist mucous membranes Cardiovascular: regular rhythm, normal rate, no murmur. S1 and S2 normal. No jugular venous distention.  Respiratory:  clear to auscultation bilaterally GI : normal bowel sounds, soft and nontender. No distention.   MSK: extremities warm, well perfused. No edema.  NEURO: grossly nonfocal exam, moves all extremities. PSYCH: alert and oriented x 3, normal mood and affect.   ASSESSMENT:    1. Nonspecific abnormal electrocardiogram (ECG) (EKG)   2. Dyspnea on exertion   3. Left bundle branch block   4. Abnormal EKG   5. Hyperlipidemia, unspecified hyperlipidemia type   6. Heart murmur   7. Hypertension associated with diabetes ([place])   8. Incisional hernia, without obstruction or gangrene   9. Primary osteoarthritis involving multiple joints       PLAN:  Dyspnea on exertion -  Left bundle branch block - Abnormal EKG - LBBB resolved .  -More prominent anterior T wave inversions today, we need to confirm with echo that this is no significant change. - tolerating physical activity well and losing weight. - no significant DOE, improving with activity.  -If abnormal echo or concerning symptoms, consider repeat ischemic testing as there was possible artifact on her last stress test.  Hypertension associated with diabetes ([facility])-blood pressure with reasonable control today, continue amlodipine, losartan HCTZ.  Continue efforts at exercise and weight loss.  Hyperlipidemia, unspecified hyperlipidemia type-she has diabetes and is on Lipitor 40 mg daily, would continue.  Last LDL is 61, at goal.  Heart murmur-faint systolic murmur heard previously, no significant murmur today.  Moderate persistent asthma with acute exacerbation-overall stable on albuterol and Symbicort.  Hernia -Now status post repair.  Feels she is healing well.  Total time of encounter: 25 minutes total time of encounter, including 15 minutes spent in face-to-face patient care on the date of this encounter. This time includes coordination of care and counseling regarding above mentioned problem list. Remainder of non-face-to-face time  involved reviewing chart documents/testing relevant to the patient encounter and documentation in the medical record. I have independently reviewed documentation from referring provider.   Cherlynn Kaiser, MD, [place]     Shared Decision Making/Informed Consent:     Medication Adjustments/Labs and Tests Ordered: Current medicines are reviewed at length with the patient today.  Concerns regarding medicines are outlined above.   Orders Placed This Encounter  Procedures   EKG 12-Lead   ECHOCARDIOGRAM COMPLETE     No orders of the defined types were placed in this encounter.    Patient Instructions  Medication Instructions:  No Changes In Medications at this time.  *If you need a refill on your cardiac medications before your next appointment, please call your pharmacy*  Testing/Procedures: Your physician has requested that you have an echocardiogram. Echocardiography is a painless test that uses sound waves to create images of your heart. It provides your doctor with information about the size and shape of your heart and how well your heart's chambers and valves are working. You may receive an ultrasound enhancing agent through an IV if needed to better visualize your heart during the echo.This procedure takes approximately one hour. There are no restrictions for this procedure. This will take place at the [address], [address].   Follow-Up: At [facility], you and your health needs are our priority.  As part of our continuing mission to provide you with exceptional heart care, we have created designated Provider Care Teams.  These Care Teams include your primary Cardiologist (physician) and Advanced Practice Providers (APPs -  Physician Assistants and Nurse Practitioners) who all work together to provide you with the care you need, when you need it.  Your next appointment:   6 month(s)  Provider:   Elouise Munroe, MD

## 2022-04-18 NOTE — Patient Instructions (Signed)
Medication Instructions:  No Changes In Medications at this time.  *If you need a refill on your cardiac medications before your next appointment, please call your pharmacy*  Testing/Procedures: Your physician has requested that you have an echocardiogram. Echocardiography is a painless test that uses sound waves to create images of your heart. It provides your doctor with information about the size and shape of your heart and how well your heart's chambers and valves are working. You may receive an ultrasound enhancing agent through an IV if needed to better visualize your heart during the echo.This procedure takes approximately one hour. There are no restrictions for this procedure. This will take place at the [address], [address].   Follow-Up: At [facility], you and your health needs are our priority.  As part of our continuing mission to provide you with exceptional heart care, we have created designated Provider Care Teams.  These Care Teams include your primary Cardiologist (physician) and Advanced Practice Providers (APPs -  Physician Assistants and Nurse Practitioners) who all work together to provide you with the care you need, when you need it.  Your next appointment:   6 month(s)  Provider:   Gayatri A Acharya, MD    

## 2022-04-27 ENCOUNTER — Other Ambulatory Visit: Payer: Self-pay | Admitting: Emergency Medicine

## 2022-04-27 DIAGNOSIS — E785 Hyperlipidemia, unspecified: Secondary | ICD-10-CM

## 2022-05-14 ENCOUNTER — Encounter: Payer: Self-pay | Admitting: Emergency Medicine

## 2022-05-14 ENCOUNTER — Ambulatory Visit (INDEPENDENT_AMBULATORY_CARE_PROVIDER_SITE_OTHER): Payer: 59 | Admitting: Emergency Medicine

## 2022-05-14 VITALS — BP 134/84 | HR 80 | Temp 98.3°F | Ht 62.0 in | Wt 198.5 lb

## 2022-05-14 DIAGNOSIS — I152 Hypertension secondary to endocrine disorders: Secondary | ICD-10-CM

## 2022-05-14 DIAGNOSIS — J4541 Moderate persistent asthma with (acute) exacerbation: Secondary | ICD-10-CM | POA: Diagnosis not present

## 2022-05-14 DIAGNOSIS — E1169 Type 2 diabetes mellitus with other specified complication: Secondary | ICD-10-CM

## 2022-05-14 DIAGNOSIS — E1159 Type 2 diabetes mellitus with other circulatory complications: Secondary | ICD-10-CM

## 2022-05-14 DIAGNOSIS — E785 Hyperlipidemia, unspecified: Secondary | ICD-10-CM | POA: Diagnosis not present

## 2022-05-14 DIAGNOSIS — M159 Polyosteoarthritis, unspecified: Secondary | ICD-10-CM

## 2022-05-14 LAB — POCT GLYCOSYLATED HEMOGLOBIN (HGB A1C): Hemoglobin A1C: 6 % — AB (ref 4.0–5.6)

## 2022-05-14 NOTE — Assessment & Plan Note (Signed)
Well-controlled hypertension Continue amlodipine 10 mg and Hyzaar 100-12.5 mg daily Well-controlled diabetes with hemoglobin A1c of 6.0 Continue weekly Mounjaro 12.5 mg and metformin 1000 mg twice a day Cardiovascular risks associated with diabetes and hypertension discussed Diet and nutrition discussed Follow-up in 6 months.

## 2022-05-14 NOTE — Assessment & Plan Note (Signed)
Much improved.  No recent use of albuterol inhaler Takes Trelegy 1 puff daily Well-controlled asthma.

## 2022-05-14 NOTE — Patient Instructions (Signed)

## 2022-05-14 NOTE — Assessment & Plan Note (Signed)
Chronic stable condition Eating better and staying active Continue atorvastatin 40 mg daily.

## 2022-05-14 NOTE — Progress Notes (Signed)
Catherine Cline 67 y.o.   Chief Complaint  Patient presents with   Medical Management of Chronic Issues    3 mnth f/u appt, patient has questions about her medications     HISTORY OF PRESENT ILLNESS: This is a 67 y.o. female here for 8074-month follow-up of diabetes, hypertension, and dyslipidemia Overall doing well.  Has no complaints or medical concerns today. Wt Readings from Last 3 Encounters:  05/14/22 198 lb 8 oz (90 kg)  04/18/22 201 lb (91.2 kg)  02/12/22 204 lb 6 oz (92.7 kg)   BP Readings from Last 3 Encounters:  05/14/22 134/84  04/18/22 135/83  02/12/22 128/84   Lab Results  Component Value Date   HGBA1C 6.0 (A) 05/14/2022     HPI   Prior to Admission medications   Medication Sig Start Date End Date Taking? Authorizing Provider  albuterol (PROVENTIL) (2.5 MG/3ML) 0.083% nebulizer solution Take 3 mLs (2.5 mg total) by nebulization every 6 (six) hours as needed for wheezing or shortness of breath. 01/31/22  Yes Freddy FinnerMills, Hannah M, NP  amLODipine (NORVASC) 10 MG tablet Take 1 tablet by mouth once daily 03/28/22  Yes Keithon Mccoin, Eilleen KempfMiguel Jose, MD  Ascorbic Acid (VITAMIN C) 1000 MG tablet Take 1,000 mg by mouth daily.   Yes [provider]  aspirin EC 81 MG tablet Take 81 mg by mouth every evening.   Yes [provider]  atorvastatin (LIPITOR) 40 MG tablet Take 1 tablet by mouth once daily 04/27/22  Yes Pansy Ostrovsky, Eilleen KempfMiguel Jose, MD  benzonatate (TESSALON) 100 MG capsule Take 2 capsules (200 mg total) by mouth 2 (two) times daily as needed for cough. 01/31/22  Yes Freddy FinnerMills, Hannah M, NP  Cholecalciferol (VITAMIN D3) 5000 units CAPS Take 5,000 Units by mouth daily.   Yes [provider]  diclofenac Sodium (VOLTAREN) 1 % GEL Apply 2 g topically 4 (four) times daily as needed (Knee/Ankle Pain).   Yes [provider]  Ferrous Sulfate 143 (45 Fe) MG TBCR Take 143 mg by mouth daily.   Yes [provider]  fluticasone (FLONASE) 50 MCG/ACT nasal  spray Place 2 sprays into both nostrils daily. 02/12/22  Yes Naziah Weckerly, Eilleen KempfMiguel Jose, MD  Fluticasone-Umeclidin-Vilant (TRELEGY ELLIPTA) 100-62.5-25 MCG/ACT AEPB Inhale 1 puff into the lungs daily. 08/12/21  Yes Anil Havard, Eilleen KempfMiguel Jose, MD  Krill Oil 1000 MG CAPS Take 1,000 mg by mouth daily.   Yes [provider]  levocetirizine (XYZAL) 5 MG tablet TAKE 1 TABLET BY MOUTH ONCE DAILY IN THE EVENING 03/07/22  Yes Keevin Panebianco, Eilleen KempfMiguel Jose, MD  losartan-hydrochlorothiazide (HYZAAR) 100-12.5 MG tablet Take 1 tablet by mouth daily. 02/12/22  Yes Georgina QuintSagardia, Miquel Stacks Jose, MD  metFORMIN (GLUCOPHAGE) 1000 MG tablet Take 1 tablet (1,000 mg total) by mouth 2 (two) times daily with a meal. 08/12/21 08/07/22 Yes Winston Misner, Eilleen KempfMiguel Jose, MD  montelukast (SINGULAIR) 10 MG tablet TAKE 1 TABLET BY MOUTH AT BEDTIME 03/07/22  Yes Keyshla Tunison, Eilleen KempfMiguel Jose, MD  Multiple Vitamin (MULTIVITAMIN) tablet Take 1 tablet by mouth daily.   Yes [provider]  omeprazole (PRILOSEC) 40 MG capsule Take 1 capsule (40 mg total) by mouth 2 (two) times daily. For 8 weeks 08/01/21  Yes Imogene Burnorsey, Ying C, MD  tirzepatide [facility](MOUNJARO) 12.5 MG/0.5ML Pen Inject 12.5 mg into the skin once a week. 04/04/22  Yes Georgina QuintSagardia, Ileen Kahre Jose, MD    No Known Allergies  Patient Active Problem List   Diagnosis Date Noted   Incisional hernia 12/10/2021   S/P hernia repair 12/10/2021  Diverticulosis 08/12/2021   History of colonic polyps 08/12/2021   Iron deficiency anemia 08/12/2021   Multiple allergies 05/01/2021   Chronic anemia 05/01/2021   Incisional hernia, without obstruction or gangrene 07/26/2020   Heart murmur 07/26/2020   Body mass index (BMI) of 40.1-44.9 in adult 10/12/2019   Moderate persistent asthma with acute exacerbation 07/13/2019   Primary osteoarthritis involving multiple joints 07/14/2018   Chronic joint pain 07/14/2018   Dyslipidemia associated with type 2 diabetes mellitus 01/14/2018   Hypertension associated with diabetes  07/07/2017   Hyperlipidemia 07/07/2017    Past Medical History:  Diagnosis Date   Anemia    Asthma    Diabetes    Hyperlipidemia    Hypertension    Obesity    osteoarthritis     Past Surgical History:  Procedure Laterality Date   COLONOSCOPY     HERNIA REPAIR     INSERTION OF MESH N/A 12/10/2021   Procedure: INSERTION OF MESH;  Surgeon: Axel Filler, MD;  Location: [facility] OR;  Service: General;  Laterality: N/A;   OVARIAN CYST REMOVAL     PARTIAL KNEE ARTHROPLASTY Left 2021   UPPER GASTROINTESTINAL ENDOSCOPY     XI ROBOTIC ASSISTED VENTRAL HERNIA N/A 12/10/2021   Procedure: ROBOTIC INCISIONAL HERNIA REPAIR WITH MESH;  Surgeon: Axel Filler, MD;  Location: [facility] OR;  Service: General;  Laterality: N/A;    Social History   Socioeconomic History   Marital status: Single    Spouse name: Not on file   Number of children: 0   Years of education: Not on file   Highest education level: 12th grade  Occupational History   Occupation: Retired  Tobacco Use   Smoking status: Never   Smokeless tobacco: Never  Vaping Use   Vaping Use: Never used  Substance and Sexual Activity   Alcohol use: Not Currently    Comment: socially   Drug use: Never   Sexual activity: Not on file  Other Topics Concern   Not on file  Social History Narrative   Not on file   Social Determinants of Health   Financial Resource Strain: Medium Risk (05/10/2022)   Overall Financial Resource Strain (CARDIA)    Difficulty of Paying Living Expenses: Somewhat hard  Food Insecurity: Food Insecurity Present (05/10/2022)   Hunger Vital Sign    Worried About Running Out of Food in the Last Year: Sometimes true    Ran Out of Food in the Last Year: Patient declined  Transportation Needs: No Transportation Needs (05/10/2022)   PRAPARE - Administrator, Civil Service (Medical): No    Lack of Transportation (Non-Medical): No  Physical Activity: Insufficiently Active (05/10/2022)   Exercise Vital Sign     Days of Exercise per Week: 2 days    Minutes of Exercise per Session: 40 min  Stress: No Stress Concern Present (05/10/2022)   [employer] of Occupational Health - Occupational Stress Questionnaire    Feeling of Stress : Only a little  Social Connections: Unknown (05/10/2022)   Social Connection and Isolation Panel [NHANES]    Frequency of Communication with Friends and Family: Three times a week    Frequency of Social Gatherings with Friends and Family: Twice a week    Attends Religious Services: Never    Database administrator or Organizations: No    Attends Engineer, structural: Not on file    Marital Status: Patient declined  Intimate Partner Violence: Not on file    Family History  Problem Relation Age of Onset   Heart disease Mother        stent   Hypertension Mother    Stroke Mother    Diabetes Sister    Hypertension Sister    Ulcerative colitis Sister    Colon cancer Neg Hx    Stomach cancer Neg Hx    Esophageal cancer Neg Hx    Rectal cancer Neg Hx    Breast cancer Neg Hx      Review of Systems  Constitutional: Negative.  Negative for chills and fever.  HENT: Negative.  Negative for congestion and sore throat.   Respiratory: Negative.  Negative for cough and shortness of breath.   Cardiovascular: Negative.  Negative for chest pain and palpitations.  Gastrointestinal:  Negative for abdominal pain, diarrhea, nausea and vomiting.  Genitourinary: Negative.  Negative for dysuria and hematuria.  Skin: Negative.  Negative for rash.  Neurological: Negative.  Negative for dizziness and headaches.  All other systems reviewed and are negative.   Vitals:   05/14/22 0827  BP: 134/84  Pulse: 80  Temp: 98.3 F (36.8 C)  SpO2: 94%    Physical Exam Constitutional:      Appearance: Normal appearance.  HENT:     Head: Normocephalic.  Eyes:     Extraocular Movements: Extraocular movements intact.  Cardiovascular:     Rate and Rhythm: Normal rate and  regular rhythm.     Pulses: Normal pulses.     Heart sounds: Normal heart sounds.  Pulmonary:     Effort: Pulmonary effort is normal.     Breath sounds: Normal breath sounds.  Musculoskeletal:     Cervical back: No tenderness.  Lymphadenopathy:     Cervical: No cervical adenopathy.  Skin:    General: Skin is warm and dry.     Capillary Refill: Capillary refill takes less than 2 seconds.  Neurological:     Mental Status: She is alert and oriented to person, place, and time.  Psychiatric:        Mood and Affect: Mood normal.        Behavior: Behavior normal.     Results for orders placed or performed in visit on 05/14/22 (from the past 24 hour(s))  POCT HgB A1C     Status: Abnormal   Collection Time: 05/14/22  8:40 AM  Result Value Ref Range   Hemoglobin A1C 6.0 (A) 4.0 - 5.6 %   HbA1c POC (<> result, manual entry)     HbA1c, POC (prediabetic range)     HbA1c, POC (controlled diabetic range)      ASSESSMENT & PLAN: A total of 44 minutes was spent with the patient and counseling/coordination of care regarding preparing for this visit, review of most recent office visit notes, review of most recent blood work results including interpretation of today's hemoglobin A1c, review of multiple chronic medical conditions and their management, review of all medications, cardiovascular risks associated with hypertension and diabetes, education on nutrition, prognosis, documentation, and need for follow-up.  Problem List Items Addressed This Visit       Cardiovascular and Mediastinum   Hypertension associated with diabetes - Primary    Well-controlled hypertension Continue amlodipine 10 mg and Hyzaar 100-12.5 mg daily Well-controlled diabetes with hemoglobin A1c of 6.0 Continue weekly Mounjaro 12.5 mg and metformin 1000 mg twice a day Cardiovascular risks associated with diabetes and hypertension discussed Diet and nutrition discussed Follow-up in 6 months.        Respiratory  Moderate persistent asthma with acute exacerbation    Much improved.  No recent use of albuterol inhaler Takes Trelegy 1 puff daily Well-controlled asthma.        Endocrine   Dyslipidemia associated with type 2 diabetes mellitus    Chronic stable condition Eating better and staying active Continue atorvastatin 40 mg daily.       Relevant Orders   POCT HgB A1C (Completed)     Musculoskeletal and Integument   Primary osteoarthritis involving multiple joints    Stable and well-controlled Pain management discussed      Patient Instructions  Diabetes Mellitus and Nutrition, Adult When you have diabetes, or diabetes mellitus, it is very important to have healthy eating habits because your blood sugar (glucose) levels are greatly affected by what you eat and drink. Eating healthy foods in the right amounts, at about the same times every day, can help you: Manage your blood glucose. Lower your risk of heart disease. Improve your blood pressure. Reach or maintain a healthy weight. What can affect my meal plan? Every person with diabetes is different, and each person has different needs for a meal plan. Your health care provider may recommend that you work with a dietitian to make a meal plan that is best for you. Your meal plan may vary depending on factors such as: The calories you need. The medicines you take. Your weight. Your blood glucose, blood pressure, and cholesterol levels. Your activity level. Other health conditions you have, such as heart or kidney disease. How do carbohydrates affect me? Carbohydrates, also called carbs, affect your blood glucose level more than any other type of food. Eating carbs raises the amount of glucose in your blood. It is important to know how many carbs you can safely have in each meal. This is different for every person. Your dietitian can help you calculate how many carbs you should have at each meal and for each snack. How does alcohol  affect me? Alcohol can cause a decrease in blood glucose (hypoglycemia), especially if you use insulin or take certain diabetes medicines by mouth. Hypoglycemia can be a life-threatening condition. Symptoms of hypoglycemia, such as sleepiness, dizziness, and confusion, are similar to symptoms of having too much alcohol. Do not drink alcohol if: Your health care provider tells you not to drink. You are pregnant, may be pregnant, or are planning to become pregnant. If you drink alcohol: Limit how much you have to: 0-1 drink a day for women. 0-2 drinks a day for men. Know how much alcohol is in your drink. In the [place] one drink equals one 12 oz bottle of beer (355 mL), one 5 oz glass of wine (148 mL), or one 1 oz glass of hard liquor (44 mL). Keep yourself hydrated with water, diet soda, or unsweetened iced tea. Keep in mind that regular soda, juice, and other mixers may contain a lot of sugar and must be counted as carbs. What are tips for following this plan?  Reading food labels Start by checking the serving size on the Nutrition Facts label of packaged foods and drinks. The number of calories and the amount of carbs, fats, and other nutrients listed on the label are based on one serving of the item. Many items contain more than one serving per package. Check the total grams (g) of carbs in one serving. Check the number of grams of saturated fats and trans fats in one serving. Choose foods that have a low amount or none of  these fats. Check the number of milligrams (mg) of salt (sodium) in one serving. Most people should limit total sodium intake to less than 2,300 mg per day. Always check the nutrition information of foods labeled as "low-fat" or "nonfat." These foods may be higher in added sugar or refined carbs and should be avoided. Talk to your dietitian to identify your daily goals for nutrients listed on the label. Shopping Avoid buying canned, pre-made, or processed foods. These foods  tend to be high in fat, sodium, and added sugar. Shop around the outside edge of the grocery store. This is where you will most often find fresh fruits and vegetables, bulk grains, fresh meats, and fresh dairy products. Cooking Use low-heat cooking methods, such as baking, instead of high-heat cooking methods, such as deep frying. Cook using healthy oils, such as olive, canola, or sunflower oil. Avoid cooking with butter, cream, or high-fat meats. Meal planning Eat meals and snacks regularly, preferably at the same times every day. Avoid going long periods of time without eating. Eat foods that are high in fiber, such as fresh fruits, vegetables, beans, and whole grains. Eat 4-6 oz (112-168 g) of lean protein each day, such as lean meat, chicken, fish, eggs, or tofu. One ounce (oz) (28 g) of lean protein is equal to: 1 oz (28 g) of meat, chicken, or fish. 1 egg.  cup (62 g) of tofu. Eat some foods each day that contain healthy fats, such as avocado, nuts, seeds, and fish. What foods should I eat? Fruits Berries. Apples. Oranges. Peaches. Apricots. Plums. Grapes. Mangoes. Papayas. Pomegranates. Kiwi. Cherries. Vegetables Leafy greens, including lettuce, spinach, kale, chard, collard greens, mustard greens, and cabbage. Beets. Cauliflower. Broccoli. Carrots. Green beans. Tomatoes. Peppers. Onions. Cucumbers. Brussels sprouts. Grains Whole grains, such as whole-wheat or whole-grain bread, crackers, tortillas, cereal, and pasta. Unsweetened oatmeal. Quinoa. Brown or wild rice. Meats and other proteins Seafood. Poultry without skin. Lean cuts of poultry and beef. Tofu. Nuts. Seeds. Dairy Low-fat or fat-free dairy products such as milk, yogurt, and cheese. The items listed above may not be a complete list of foods and beverages you can eat and drink. Contact a dietitian for more information. What foods should I avoid? Fruits Fruits canned with syrup. Vegetables Canned vegetables. Frozen  vegetables with butter or cream sauce. Grains Refined white flour and flour products such as bread, pasta, snack foods, and cereals. Avoid all processed foods. Meats and other proteins Fatty cuts of meat. Poultry with skin. Breaded or fried meats. Processed meat. Avoid saturated fats. Dairy Full-fat yogurt, cheese, or milk. Beverages Sweetened drinks, such as soda or iced tea. The items listed above may not be a complete list of foods and beverages you should avoid. Contact a dietitian for more information. Questions to ask a health care provider Do I need to meet with a certified diabetes care and education specialist? Do I need to meet with a dietitian? What number can I call if I have questions? When are the best times to check my blood glucose? Where to find more information: American Diabetes Association: diabetes.org Academy of Nutrition and Dietetics: eatright.[employer] of Diabetes and Digestive and Kidney Diseases: StageSync.si Association of Diabetes Care & Education Specialists: diabeteseducator.org Summary It is important to have healthy eating habits because your blood sugar (glucose) levels are greatly affected by what you eat and drink. It is important to use alcohol carefully. A healthy meal plan will help you manage your blood glucose and lower your risk  of heart disease. Your health care provider may recommend that you work with a dietitian to make a meal plan that is best for you. This information is not intended to replace advice given to you by your health care provider. Make sure you discuss any questions you have with your health care provider. Document Revised: 08/24/2019 Document Reviewed: 08/24/2019 Elsevier Patient Education  2023 Elsevier Inc.      Edwina Barth, MD [facility]

## 2022-05-14 NOTE — Assessment & Plan Note (Signed)
Stable and well-controlled. Pain management discussed. 

## 2022-05-16 ENCOUNTER — Ambulatory Visit (HOSPITAL_COMMUNITY): Payer: 59 | Attending: Cardiovascular Disease

## 2022-05-16 DIAGNOSIS — R9431 Abnormal electrocardiogram [ECG] [EKG]: Secondary | ICD-10-CM | POA: Insufficient documentation

## 2022-05-16 LAB — ECHOCARDIOGRAM COMPLETE
Area-P 1/2: 3.27 cm2
S' Lateral: 3 cm

## 2022-06-13 ENCOUNTER — Other Ambulatory Visit: Payer: Self-pay | Admitting: Emergency Medicine

## 2022-06-24 ENCOUNTER — Other Ambulatory Visit: Payer: Self-pay | Admitting: Emergency Medicine

## 2022-06-24 DIAGNOSIS — I152 Hypertension secondary to endocrine disorders: Secondary | ICD-10-CM

## 2022-06-27 ENCOUNTER — Other Ambulatory Visit: Payer: Self-pay | Admitting: Internal Medicine

## 2022-07-06 ENCOUNTER — Other Ambulatory Visit: Payer: Self-pay | Admitting: Emergency Medicine

## 2022-07-20 IMAGING — US US BREAST*L* LIMITED INC AXILLA
1 series · 5 of 5 positions shown · non-contrast
Comparison: Previous exam(s).
COMPARISON: Previous exam(s).

Addendum:
CLINICAL DATA: Patient recalled from screening for left breast
asymmetry.

EXAM:
DIGITAL DIAGNOSTIC LEFT MAMMOGRAM WITH CAD AND TOMO
ULTRASOUND LEFT BREAST

[Series 1: us breast*left* limited inc axilla · 0.07mm/px · 5 of 5 slices shown]
[im 1/5]
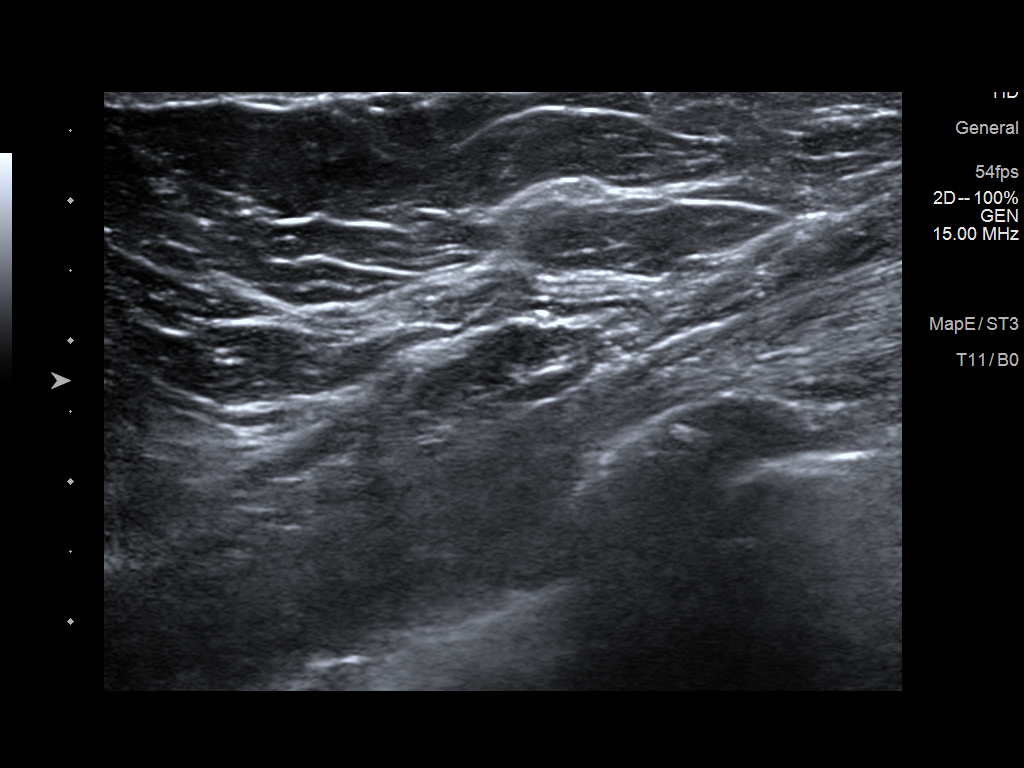
[im 2/5]
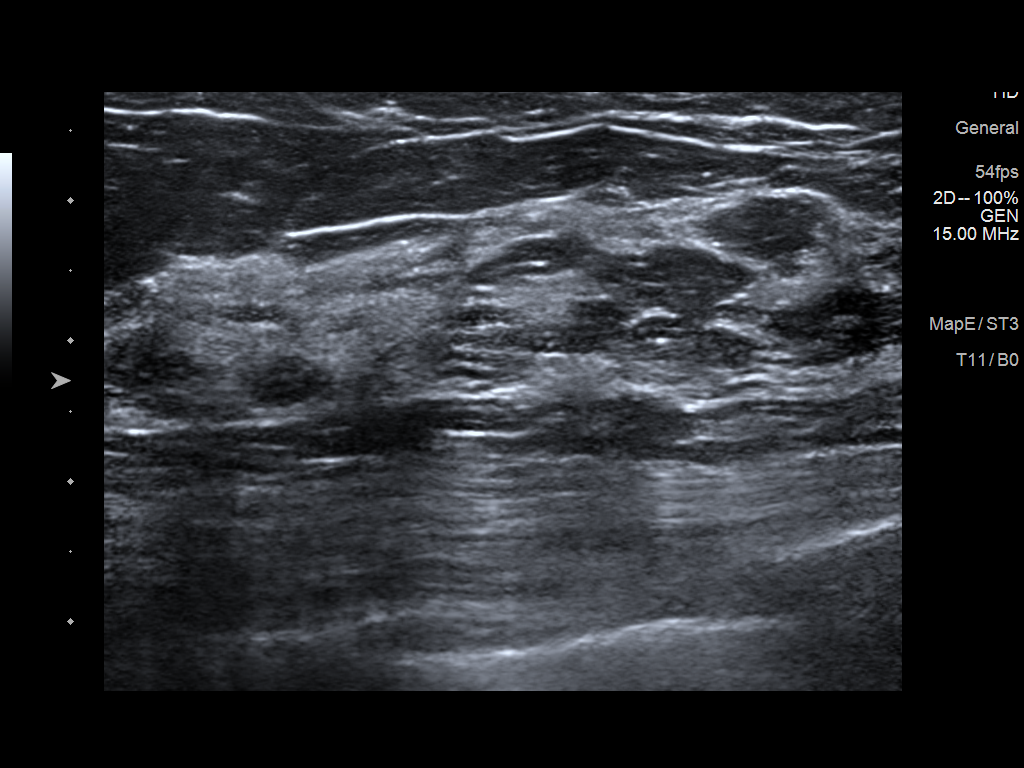
[im 3/5]
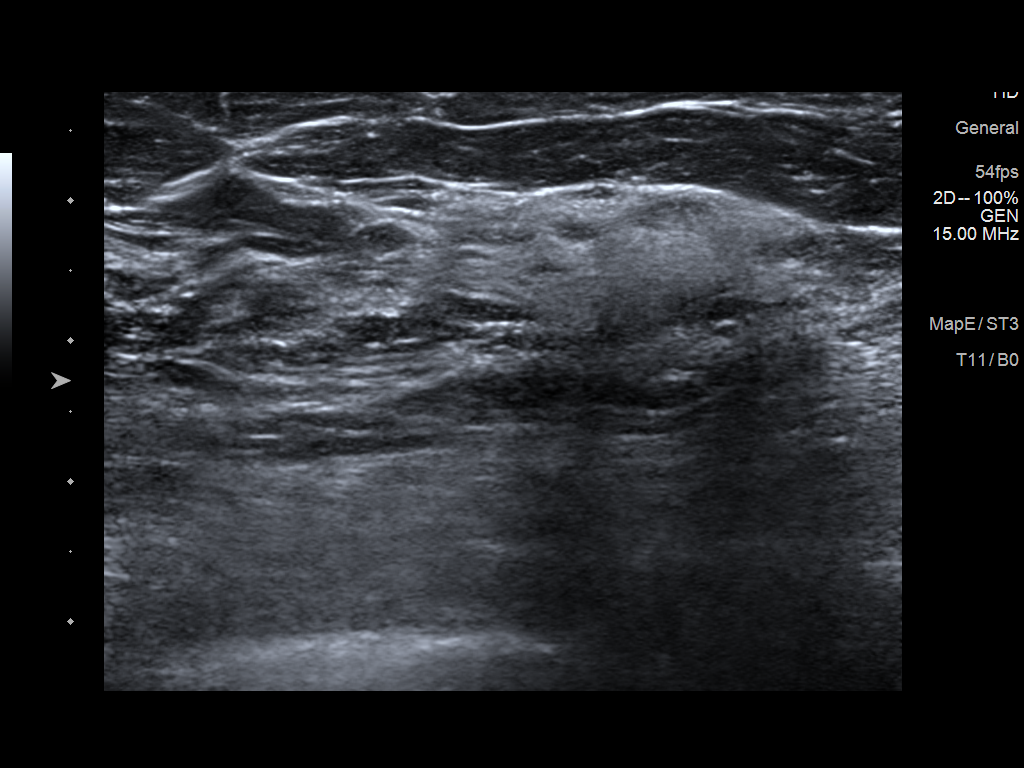
[im 4/5]
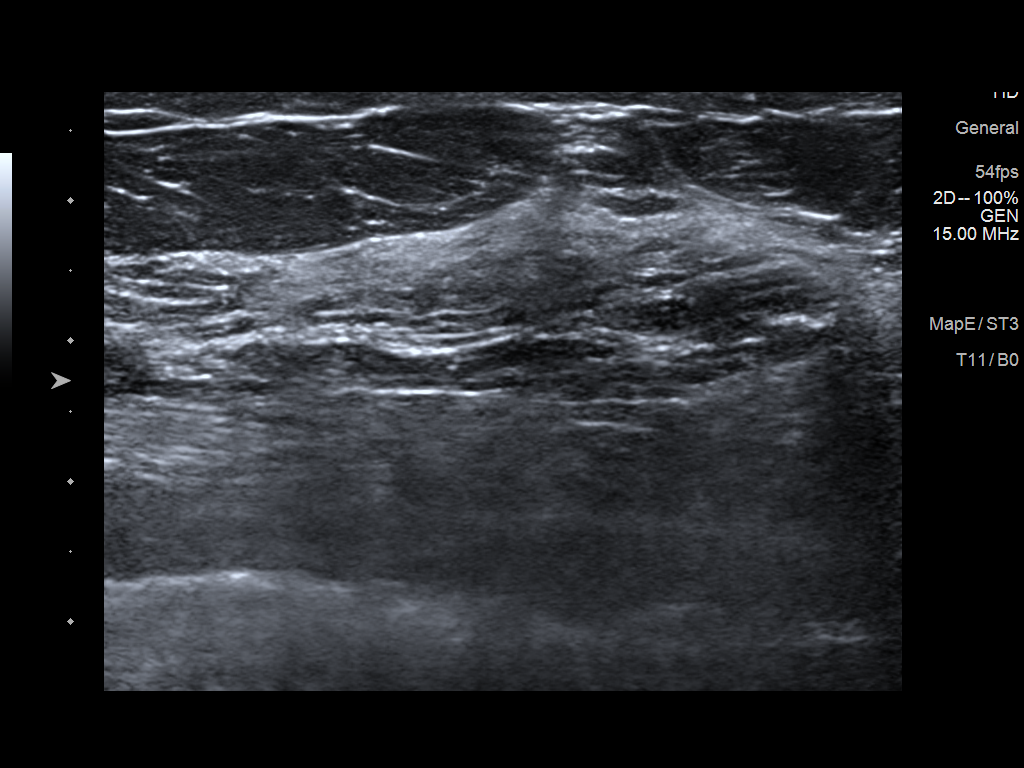
[im 5/5]
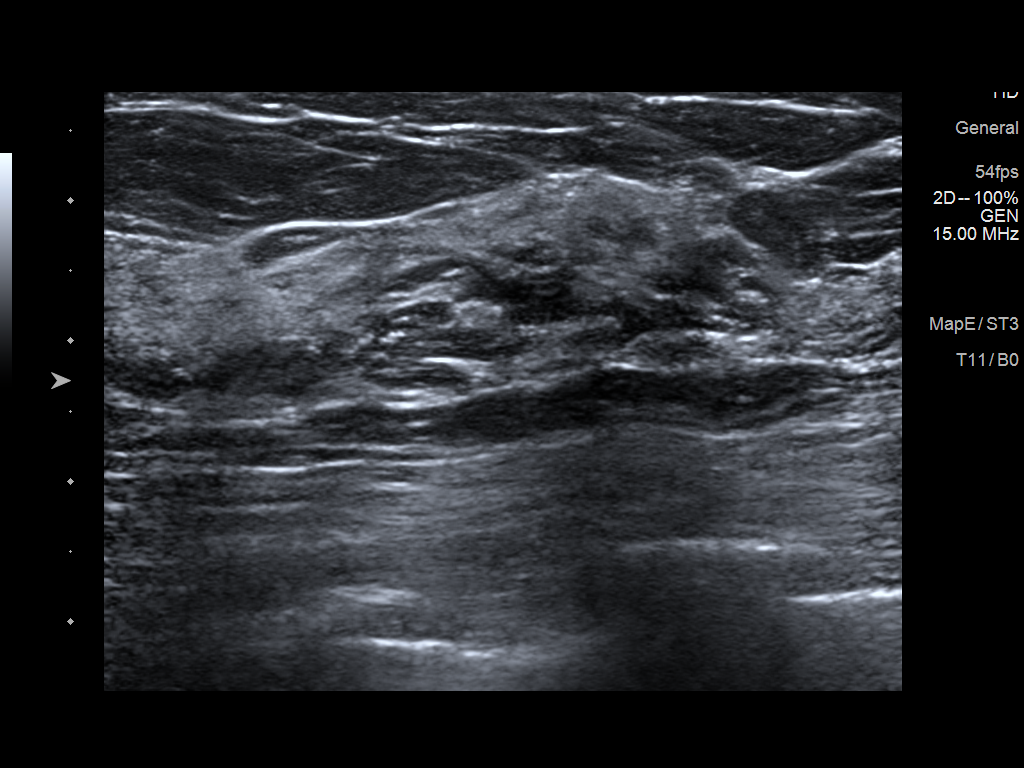

[5 of 5 positions shown; findings below may reference images not displayed]

ACR Breast Density Category c: The breast tissue is heterogeneously
dense, which may obscure small masses.
FINDINGS: Questioned asymmetry within the posterior aspect of the left breast
partially effaced with additional imaging suggestive of dense
fibroglandular tissue. Additionally, there is an asymmetry within
the outer posterior left breast, further evaluated with additional
imaging.

Mammographic images were processed with CAD.

Targeted ultrasound is performed, showing normal dense tissue
without suspicious mass within the left breast lateral and inferior.
IMPRESSION: Asymmetry within the left breast favored to represent dense
fibroglandular tissue.

RECOMMENDATION:
Patient reports having outside prior mammograms in [place]. An
attempt will be made to obtain these images. If they are obtained,
they will be compared with the current exam and an addendum will be
issued.

If the outside prior exams are not able to be obtained, recommend
six-month follow-up left breast diagnostic mammogram to reassess the
asymmetry within the left breast.

I have discussed the findings and recommendations with the patient.
If applicable, a reminder letter will be sent to the patient
regarding the next appointment.

BI-RADS CATEGORY  3: Probably benign.

ADDENDUM:
Outside prior mammograms dated 09/28/2012 were obtained and compared
with the current evaluation. The asymmetry within the left breast is
stable when compared to prior exams compatible with benign process.
IMPRESSION: No mammographic evidence for malignancy.

Recommendation:

Return to annual screening mammography [DATE].

BI-RADS category:

2: Benign.

*** End of Addendum ***
ACR Breast Density Category c: The breast tissue is heterogeneously
dense, which may obscure small masses.
FINDINGS: Questioned asymmetry within the posterior aspect of the left breast
partially effaced with additional imaging suggestive of dense
fibroglandular tissue. Additionally, there is an asymmetry within
the outer posterior left breast, further evaluated with additional
imaging.

Mammographic images were processed with CAD.

Targeted ultrasound is performed, showing normal dense tissue
without suspicious mass within the left breast lateral and inferior.
IMPRESSION: Asymmetry within the left breast favored to represent dense
fibroglandular tissue.

RECOMMENDATION:
Patient reports having outside prior mammograms in [place]. An
attempt will be made to obtain these images. If they are obtained,
they will be compared with the current exam and an addendum will be
issued.

If the outside prior exams are not able to be obtained, recommend
six-month follow-up left breast diagnostic mammogram to reassess the
asymmetry within the left breast.

I have discussed the findings and recommendations with the patient.
If applicable, a reminder letter will be sent to the patient
regarding the next appointment.

BI-RADS CATEGORY  3: Probably benign.

## 2022-07-20 IMAGING — MG MM DIGITAL DIAGNOSTIC UNILAT*L* W/ TOMO W/ CAD
8 of 14 series · 8 of 40 positions shown · non-contrast
Comparison: Previous exam(s).
COMPARISON: Previous exam(s).

Addendum:
CLINICAL DATA: Patient recalled from screening for left breast
asymmetry.

EXAM:
DIGITAL DIAGNOSTIC LEFT MAMMOGRAM WITH CAD AND TOMO
ULTRASOUND LEFT BREAST

[L CC synth-2D (1 of 4)]
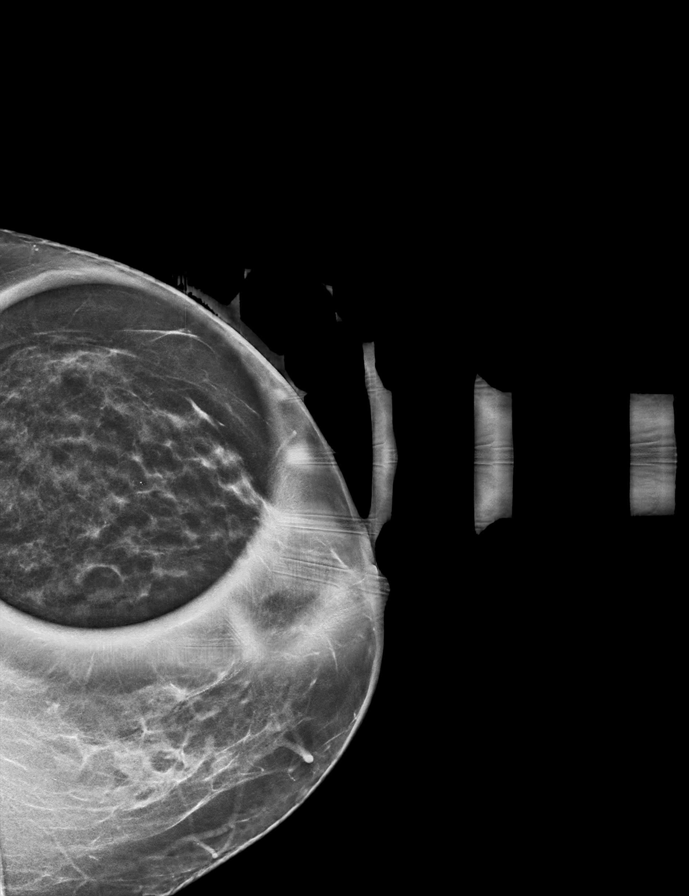

[L ML synth-2D]
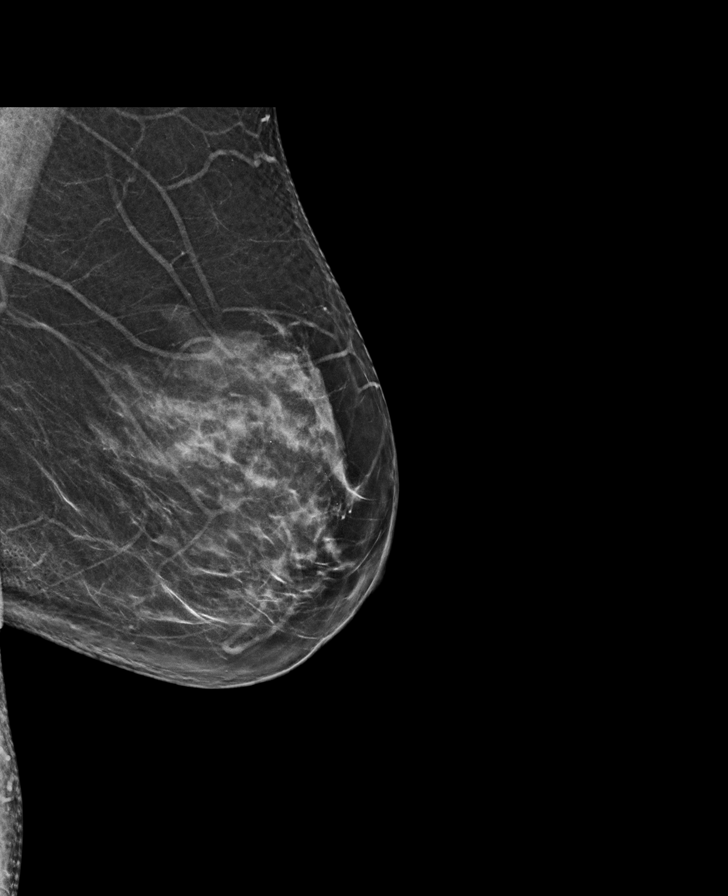

[L MLO synth-2D (1 of 2)]
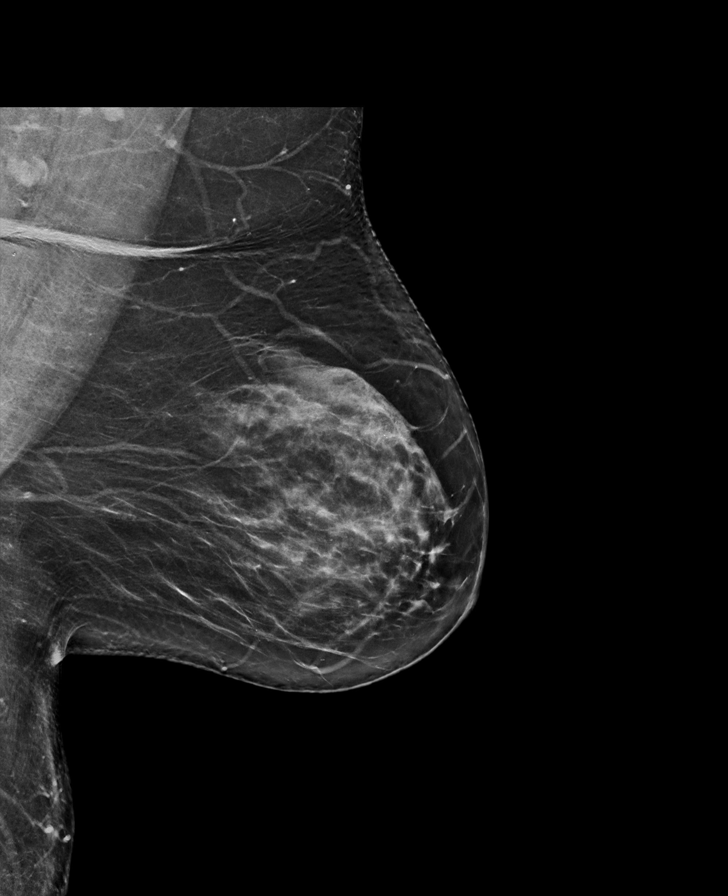

[L CC synth-2D (2 of 4)]
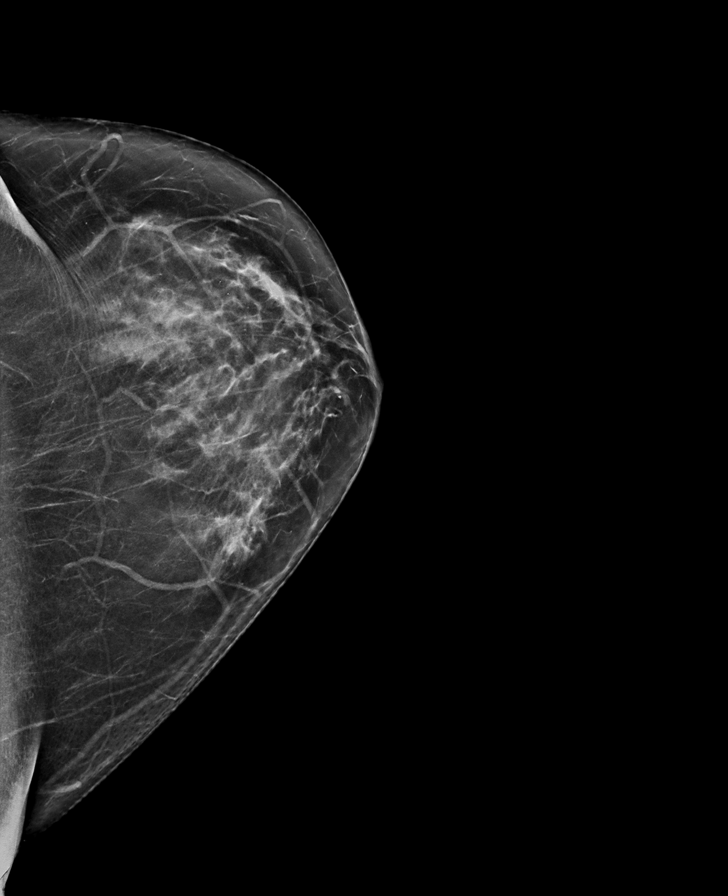

[L MLO synth-2D (2 of 2)]
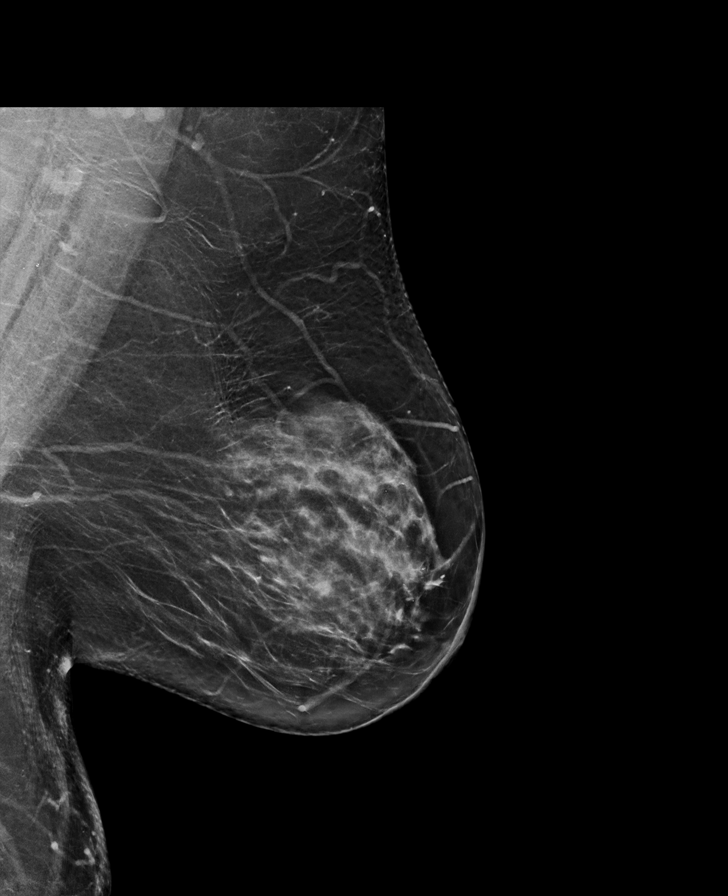

[L CC synth-2D (3 of 4)]
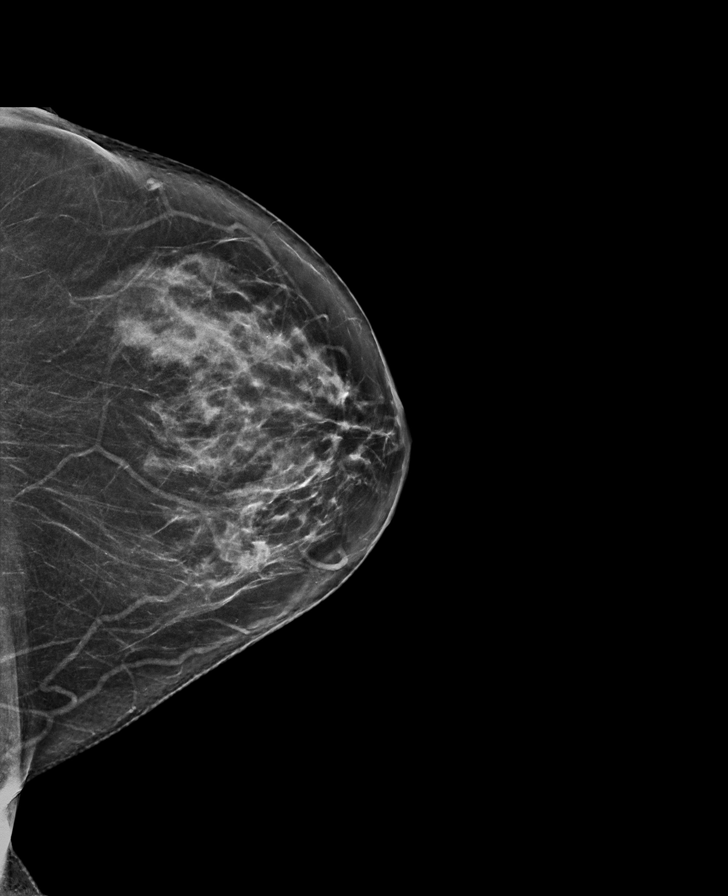

[L CC synth-2D (4 of 4)]
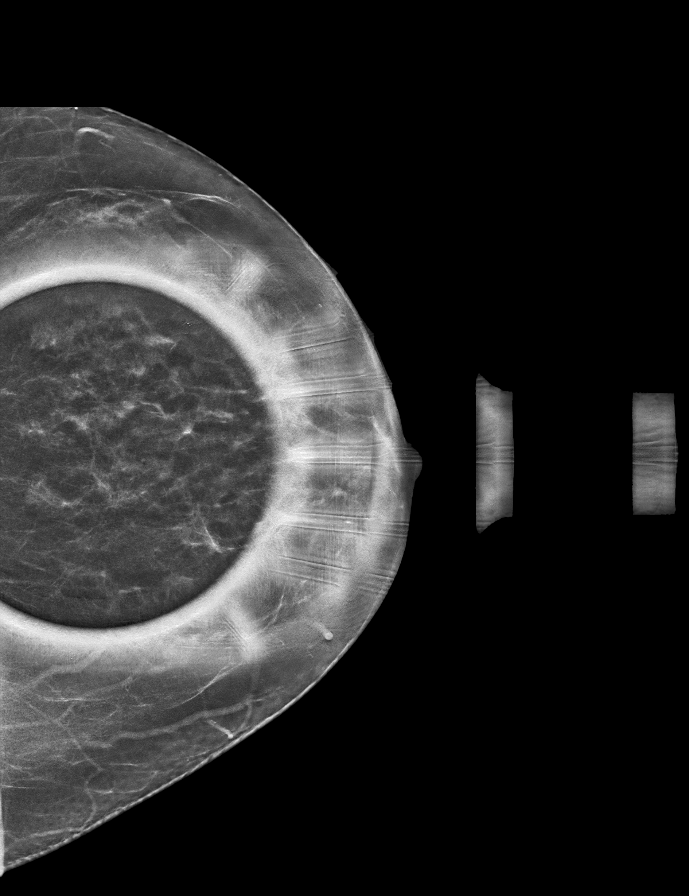

[L MLO tomo · tomo slice 39/78.0]
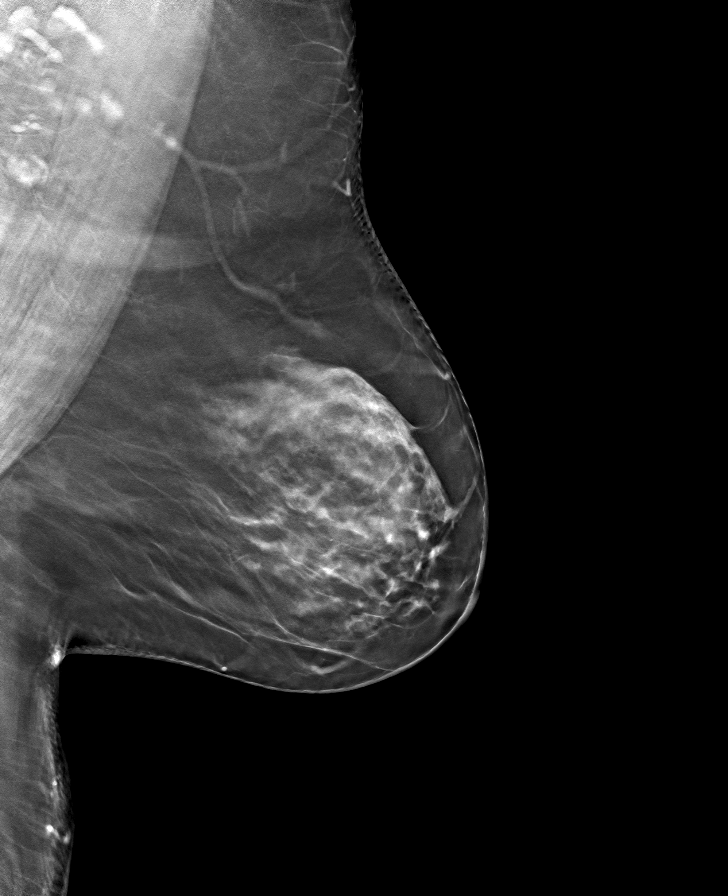

[8 of 40 positions shown; findings below may reference images not displayed]

ACR Breast Density Category c: The breast tissue is heterogeneously
dense, which may obscure small masses.
FINDINGS: Questioned asymmetry within the posterior aspect of the left breast
partially effaced with additional imaging suggestive of dense
fibroglandular tissue. Additionally, there is an asymmetry within
the outer posterior left breast, further evaluated with additional
imaging.

Mammographic images were processed with CAD.

Targeted ultrasound is performed, showing normal dense tissue
without suspicious mass within the left breast lateral and inferior.
IMPRESSION: Asymmetry within the left breast favored to represent dense
fibroglandular tissue.

RECOMMENDATION:
Patient reports having outside prior mammograms in [place]. An
attempt will be made to obtain these images. If they are obtained,
they will be compared with the current exam and an addendum will be
issued.

If the outside prior exams are not able to be obtained, recommend
six-month follow-up left breast diagnostic mammogram to reassess the
asymmetry within the left breast.

I have discussed the findings and recommendations with the patient.
If applicable, a reminder letter will be sent to the patient
regarding the next appointment.

BI-RADS CATEGORY  3: Probably benign.

ADDENDUM:
Outside prior mammograms dated 09/28/2012 were obtained and compared
with the current evaluation. The asymmetry within the left breast is
stable when compared to prior exams compatible with benign process.
IMPRESSION: No mammographic evidence for malignancy.

Recommendation:

Return to annual screening mammography [DATE].

BI-RADS category:

2: Benign.

*** End of Addendum ***
ACR Breast Density Category c: The breast tissue is heterogeneously
dense, which may obscure small masses.
FINDINGS: Questioned asymmetry within the posterior aspect of the left breast
partially effaced with additional imaging suggestive of dense
fibroglandular tissue. Additionally, there is an asymmetry within
the outer posterior left breast, further evaluated with additional
imaging.

Mammographic images were processed with CAD.

Targeted ultrasound is performed, showing normal dense tissue
without suspicious mass within the left breast lateral and inferior.
IMPRESSION: Asymmetry within the left breast favored to represent dense
fibroglandular tissue.

RECOMMENDATION:
Patient reports having outside prior mammograms in [place]. An
attempt will be made to obtain these images. If they are obtained,
they will be compared with the current exam and an addendum will be
issued.

If the outside prior exams are not able to be obtained, recommend
six-month follow-up left breast diagnostic mammogram to reassess the
asymmetry within the left breast.

I have discussed the findings and recommendations with the patient.
If applicable, a reminder letter will be sent to the patient
regarding the next appointment.

BI-RADS CATEGORY  3: Probably benign.

## 2022-07-23 ENCOUNTER — Other Ambulatory Visit: Payer: Self-pay | Admitting: Emergency Medicine

## 2022-07-23 DIAGNOSIS — E785 Hyperlipidemia, unspecified: Secondary | ICD-10-CM

## 2022-08-07 ENCOUNTER — Other Ambulatory Visit: Payer: Self-pay | Admitting: Emergency Medicine

## 2022-08-07 DIAGNOSIS — Z8709 Personal history of other diseases of the respiratory system: Secondary | ICD-10-CM

## 2022-09-09 ENCOUNTER — Other Ambulatory Visit: Payer: Self-pay | Admitting: Emergency Medicine

## 2022-09-24 LAB — LAB REPORT - SCANNED: Microalb Creat Ratio: 29.999

## 2022-10-06 ENCOUNTER — Other Ambulatory Visit: Payer: Self-pay | Admitting: Emergency Medicine

## 2022-10-15 ENCOUNTER — Other Ambulatory Visit: Payer: Self-pay | Admitting: Emergency Medicine

## 2022-10-15 DIAGNOSIS — I152 Hypertension secondary to endocrine disorders: Secondary | ICD-10-CM

## 2022-10-15 DIAGNOSIS — Z8709 Personal history of other diseases of the respiratory system: Secondary | ICD-10-CM

## 2022-10-29 ENCOUNTER — Ambulatory Visit: Payer: 59 | Attending: Internal Medicine | Admitting: Internal Medicine

## 2022-10-29 ENCOUNTER — Encounter: Payer: Self-pay | Admitting: Internal Medicine

## 2022-10-29 VITALS — BP 102/76 | HR 97 | Ht 62.0 in | Wt 183.0 lb

## 2022-10-29 DIAGNOSIS — E785 Hyperlipidemia, unspecified: Secondary | ICD-10-CM

## 2022-10-29 DIAGNOSIS — I447 Left bundle-branch block, unspecified: Secondary | ICD-10-CM

## 2022-10-29 DIAGNOSIS — R0609 Other forms of dyspnea: Secondary | ICD-10-CM

## 2022-10-29 DIAGNOSIS — I152 Hypertension secondary to endocrine disorders: Secondary | ICD-10-CM | POA: Diagnosis not present

## 2022-10-29 DIAGNOSIS — R9431 Abnormal electrocardiogram [ECG] [EKG]: Secondary | ICD-10-CM | POA: Diagnosis not present

## 2022-10-29 DIAGNOSIS — E1159 Type 2 diabetes mellitus with other circulatory complications: Secondary | ICD-10-CM

## 2022-10-29 NOTE — Patient Instructions (Signed)
Medication Instructions:  Your physician recommends that you continue on your current medications as directed. Please refer to the Current Medication list given to you today.  *If you need a refill on your cardiac medications before your next appointment, please call your pharmacy*  Follow-Up: At [facility], you and your health needs are our priority.  As part of our continuing mission to provide you with exceptional heart care, we have created designated Provider Care Teams.  These Care Teams include your primary Cardiologist (physician) and Advanced Practice Providers (APPs -  Physician Assistants and Nurse Practitioners) who all work together to provide you with the care you need, when you need it.  We recommend signing up for the patient portal called "MyChart".  Sign up information is provided on this After Visit Summary.  MyChart is used to connect with patients for Virtual Visits (Telemedicine).  Patients are able to view lab/test results, encounter notes, upcoming appointments, etc.  Non-urgent messages can be sent to your provider as well.   To learn more about what you can do with MyChart, go to [email or web address].    Your next appointment:   6 month(s)  Provider:   Parke Poisson, MD

## 2022-10-29 NOTE — Progress Notes (Signed)
Cardiology Office Note:    Date:  10/29/2022   ID:  CASH [id number], DOB [birth year], MRN [id number]  PCP:  Georgina Quint, MD  Cardiologist:  Parke Poisson, MD  Electrophysiologist:  None   Referring MD: Georgina Quint, *   Chief Complaint/Reason for Referral: Heart Murmur, dyspnea on exertion  History of Present Illness:    Catherine Cline is a 67 y.o. female with a history of arthritis, asthma, diabetes, and hypertension here for f/u.  Discussed the use of AI scribe software for clinical note transcription with the patient, who gave verbal consent to proceed.  History of Present Illness   The patient, with a history of hypertension, diabetes, and a hernia surgery about a year ago, presents for a routine follow-up. She reports a significant weight loss of about 40 pounds since her last visit, which she attributes to dietary changes. She denies any chest discomfort or shortness of breath. However, she mentions a lack of energy, which she attributes to the physical demands of taking care of a family member. She also reports lower than usual blood pressure but denies any associated symptoms such as dizziness or lightheadedness. The patient is compliant with her medications, including Amlodipine, Aspirin, Atorvastatin, Losartan HCTZ, and Mounjaro.       Past Medical History:  Diagnosis Date   Anemia    Asthma    Diabetes (HCC)    Hyperlipidemia    Hypertension    Obesity    osteoarthritis     Past Surgical History:  Procedure Laterality Date   COLONOSCOPY     HERNIA REPAIR     INSERTION OF MESH N/A 12/10/2021   Procedure: INSERTION OF MESH;  Surgeon: Axel Filler, MD;  Location: [facility] OR;  Service: General;  Laterality: N/A;   OVARIAN CYST REMOVAL     PARTIAL KNEE ARTHROPLASTY Left 2021   UPPER GASTROINTESTINAL ENDOSCOPY     XI ROBOTIC ASSISTED VENTRAL HERNIA N/A 12/10/2021   Procedure: ROBOTIC INCISIONAL HERNIA REPAIR WITH MESH;  Surgeon: Axel Filler,  MD;  Location: [facility] OR;  Service: General;  Laterality: N/A;    Current Medications: Current Meds  Medication Sig   albuterol (PROVENTIL) (2.5 MG/3ML) 0.083% nebulizer solution Take 3 mLs (2.5 mg total) by nebulization every 6 (six) hours as needed for wheezing or shortness of breath.   amLODipine (NORVASC) 10 MG tablet Take 1 tablet by mouth once daily   Ascorbic Acid (VITAMIN C) 1000 MG tablet Take 1,000 mg by mouth daily.   aspirin EC 81 MG tablet Take 81 mg by mouth every evening.   atorvastatin (LIPITOR) 40 MG tablet Take 1 tablet by mouth once daily   Cholecalciferol (VITAMIN D3) 5000 units CAPS Take 5,000 Units by mouth daily.   diclofenac Sodium (VOLTAREN) 1 % GEL Apply 2 g topically 4 (four) times daily as needed (Knee/Ankle Pain).   Ferrous Sulfate 143 (45 Fe) MG TBCR Take 143 mg by mouth daily.   fluticasone (FLONASE) 50 MCG/ACT nasal spray Place 2 sprays into both nostrils daily.   Krill Oil 1000 MG CAPS Take 1,000 mg by mouth daily.   levocetirizine (XYZAL) 5 MG tablet TAKE 1 TABLET BY MOUTH ONCE DAILY IN THE EVENING   losartan-hydrochlorothiazide (HYZAAR) 100-12.5 MG tablet Take 1 tablet by mouth daily.   metFORMIN (GLUCOPHAGE) 1000 MG tablet TAKE 1 TABLET BY MOUTH TWICE DAILY WITH MEALS   montelukast (SINGULAIR) 10 MG tablet TAKE 1 TABLET BY MOUTH AT BEDTIME   Multiple Vitamin (MULTIVITAMIN)  tablet Take 1 tablet by mouth daily.   omeprazole (PRILOSEC) 40 MG capsule Take 1 capsule (40 mg total) by mouth daily.   tirzepatide (MOUNJARO) 12.5 MG/0.5ML Pen Inject 12.5 mg into the skin once a week.   TRELEGY ELLIPTA 100-62.5-25 MCG/ACT AEPB Inhale 1 puff by mouth once daily     Allergies:   Patient has no known allergies.   Social History   Tobacco Use   Smoking status: Never   Smokeless tobacco: Never  Vaping Use   Vaping status: Never Used  Substance Use Topics   Alcohol use: Not Currently    Comment: socially   Drug use: Never     Family History: The patient's  family history includes Diabetes in her sister; Heart disease in her mother; Hypertension in her mother and sister; Stroke in her mother; Ulcerative colitis in her sister. There is no history of Colon cancer, Stomach cancer, Esophageal cancer, Rectal cancer, or Breast cancer.  ROS:   Please see the history of present illness.     All other systems reviewed and are negative.  EKGs/Labs/Other Studies Reviewed:    The following studies were reviewed today: Results   LABS LDL: 61 (2023) Triglycerides: 133 (2023)  DIAGNOSTIC Echo: Normal Stress test: Normal       EKG: EKG Interpretation Date/Time:  Wednesday October 29 2022 08:37:32 EDT Ventricular Rate:  97 PR Interval:  160 QRS Duration:  78 QT Interval:  348 QTC Calculation: 441 R Axis:   -24  Text Interpretation: Normal sinus rhythm Low voltage QRS Cannot rule out Inferior infarct , age undetermined T wave abnormality, consider anterior ischemia Confirmed by Weston Brass ([id number]) on 10/29/2022 9:01:18 AM   04/18/22: NSR, inferior infarct pattern, T wave abnormality anterior. 10/30/21: NSR, nonspecific ST abnl. QRS duration 82 ms, no LBBB 07/31/2020: Sinus rhythm, LBBB, rate 90 bpm, QRS duration of 140 ms  Recent Labs: 12/02/2021: BUN 28; Creatinine, Ser 0.90; Hemoglobin 11.7; Platelets 360; Potassium 4.5; Sodium 140  Recent Lipid Panel    Component Value Date/Time   CHOL 129 05/01/2021 0933   CHOL 149 10/12/2019 0941   TRIG 133.0 05/01/2021 0933   HDL 41.30 05/01/2021 0933   HDL 46 10/12/2019 0941   CHOLHDL 3 05/01/2021 0933   VLDL 26.6 05/01/2021 0933   LDLCALC 61 05/01/2021 0933   LDLCALC 80 10/12/2019 0941    Physical Exam:    VS:  BP 102/76   Pulse 97   Ht 5\' 2"  (1.575 m)   Wt 183 lb (83 kg)   SpO2 96%   BMI 33.47 kg/m     Wt Readings from Last 5 Encounters:  10/29/22 183 lb (83 kg)  05/14/22 198 lb 8 oz (90 kg)  04/18/22 201 lb (91.2 kg)  02/12/22 204 lb 6 oz (92.7 kg)  12/10/21 213 lb (96.6 kg)     Constitutional: No acute distress Eyes: sclera non-icteric, normal conjunctiva and lids ENMT: normal dentition, moist mucous membranes Cardiovascular: regular rhythm, normal rate, no murmur. S1 and S2 normal. No jugular venous distention.  Respiratory: clear to auscultation bilaterally GI : normal bowel sounds, soft and nontender. No distention.   MSK: extremities warm, well perfused. No edema.  NEURO: grossly nonfocal exam, moves all extremities. PSYCH: alert and oriented x 3, normal mood and affect.    ASSESSMENT:    1. Hypertension associated with diabetes (HCC)   2. Hyperlipidemia, unspecified hyperlipidemia type   3. Abnormal EKG   4. Left bundle branch block  5. Dyspnea on exertion       PLAN:    Assessment and Plan    Hypertension Well controlled with Amlodipine 10mg  daily and Losartan HCTZ 100-12.5mg  daily. Blood pressure at today's visit was 102/76. -Continue current medications.  Hyperlipidemia Well controlled with Atorvastatin 40mg  daily. Last LDL was 61 and triglycerides were 133. -Continue Atorvastatin 40mg  daily. -Check lipid panel with primary care provider.  Type 2 Diabetes Mellitus On Mounjaro. Significant weight loss noted. -Continue Mounjaro. -Check HbA1c with primary care provider.  Post-operative status post hernia repair No complications reported. Surgery was in November 2023. -No further action needed.  Cardiac health Normal rhythm on EKG. Previous EKG showed left bundle branch block, but not present on today's EKG. No palpitations reported. -Continue Aspirin 81mg  daily. -Continue regular EKG monitoring for conduction system disease.  Physical activity and weight management Limited due to caregiving responsibilities and lack of energy. Significant weight loss achieved through dietary changes. -Encourage regular physical activity, even short walks. -Consider resistance band exercises for strength training. -Consider use of compression  socks for leg swelling.  Follow-up in 6 months or sooner if needed.        Total time of encounter: 25 minutes total time of encounter, including 15 minutes spent in face-to-face patient care on the date of this encounter. This time includes coordination of care and counseling regarding above mentioned problem list. Remainder of non-face-to-face time involved reviewing chart documents/testing relevant to the patient encounter and documentation in the medical record. I have independently reviewed documentation from referring provider.   Weston Brass, MD, [facility] [place]  [facility]     Shared Decision Making/Informed Consent:     Medication Adjustments/Labs and Tests Ordered: Current medicines are reviewed at length with the patient today.  Concerns regarding medicines are outlined above.   Orders Placed This Encounter  Procedures   EKG 12-Lead     No orders of the defined types were placed in this encounter.    Patient Instructions  Medication Instructions:  Your physician recommends that you continue on your current medications as directed. Please refer to the Current Medication list given to you today.  *If you need a refill on your cardiac medications before your next appointment, please call your pharmacy*    Follow-Up: At [facility], you and your health needs are our priority.  As part of our continuing mission to provide you with exceptional heart care, we have created designated Provider Care Teams.  These Care Teams include your primary Cardiologist (physician) and Advanced Practice Providers (APPs -  Physician Assistants and Nurse Practitioners) who all work together to provide you with the care you need, when you need it.  We recommend signing up for the patient portal called "MyChart".  Sign up information is provided on this After Visit Summary.  MyChart is used to connect with patients for Virtual Visits (Telemedicine).  Patients are able to  view lab/test results, encounter notes, upcoming appointments, etc.  Non-urgent messages can be sent to your provider as well.   To learn more about what you can do with MyChart, go to [email or web address].    Your next appointment:   6 month(s)  Provider:   Parke Poisson, MD

## 2022-11-10 ENCOUNTER — Other Ambulatory Visit: Payer: Self-pay | Admitting: Emergency Medicine

## 2022-11-10 DIAGNOSIS — Z8709 Personal history of other diseases of the respiratory system: Secondary | ICD-10-CM

## 2022-11-12 ENCOUNTER — Encounter: Payer: Self-pay | Admitting: Emergency Medicine

## 2022-11-12 ENCOUNTER — Ambulatory Visit (INDEPENDENT_AMBULATORY_CARE_PROVIDER_SITE_OTHER): Payer: 59 | Admitting: Emergency Medicine

## 2022-11-12 VITALS — BP 110/74 | HR 97 | Temp 98.1°F | Ht 62.0 in | Wt 181.5 lb

## 2022-11-12 DIAGNOSIS — Z7984 Long term (current) use of oral hypoglycemic drugs: Secondary | ICD-10-CM | POA: Diagnosis not present

## 2022-11-12 DIAGNOSIS — M150 Primary generalized (osteo)arthritis: Secondary | ICD-10-CM

## 2022-11-12 DIAGNOSIS — Z7985 Long-term (current) use of injectable non-insulin antidiabetic drugs: Secondary | ICD-10-CM | POA: Diagnosis not present

## 2022-11-12 DIAGNOSIS — J4541 Moderate persistent asthma with (acute) exacerbation: Secondary | ICD-10-CM | POA: Diagnosis not present

## 2022-11-12 DIAGNOSIS — I152 Hypertension secondary to endocrine disorders: Secondary | ICD-10-CM | POA: Diagnosis not present

## 2022-11-12 DIAGNOSIS — E1159 Type 2 diabetes mellitus with other circulatory complications: Secondary | ICD-10-CM

## 2022-11-12 DIAGNOSIS — E1169 Type 2 diabetes mellitus with other specified complication: Secondary | ICD-10-CM | POA: Diagnosis not present

## 2022-11-12 DIAGNOSIS — E785 Hyperlipidemia, unspecified: Secondary | ICD-10-CM

## 2022-11-12 DIAGNOSIS — Z6841 Body Mass Index (BMI) 40.0 and over, adult: Secondary | ICD-10-CM

## 2022-11-12 LAB — POCT GLYCOSYLATED HEMOGLOBIN (HGB A1C): Hemoglobin A1C: 5.7 % — AB (ref 4.0–5.6)

## 2022-11-12 NOTE — Assessment & Plan Note (Signed)
Stable and well-controlled. Pain management discussed. 

## 2022-11-12 NOTE — Progress Notes (Signed)
Catherine Cline 67 y.o.   Chief Complaint  Patient presents with   Medical Management of Chronic Issues    6 month follow up on previous issue, no new concern     HISTORY OF PRESENT ILLNESS: This is a 67 y.o. female here for 73-month follow-up of chronic medical conditions. Overall doing very well.  Recently saw her cardiologist for follow-up.  No concerns. Eating better and losing weight No changes in her medications Has no complaints or any other medical concerns today.  HPI   Prior to Admission medications   Medication Sig Start Date End Date Taking? Authorizing Provider  albuterol (PROVENTIL) (2.5 MG/3ML) 0.083% nebulizer solution Take 3 mLs (2.5 mg total) by nebulization every 6 (six) hours as needed for wheezing or shortness of breath. 01/31/22  Yes Freddy Finner, NP  amLODipine (NORVASC) 10 MG tablet Take 1 tablet by mouth once daily 06/24/22  Yes Gera Inboden, Eilleen Kempf, MD  aspirin EC 81 MG tablet Take 81 mg by mouth every evening.   Yes [provider]  atorvastatin (LIPITOR) 40 MG tablet Take 1 tablet by mouth once daily 07/23/22  Yes Angelize Ryce, Eilleen Kempf, MD  Cholecalciferol (VITAMIN D3) 5000 units CAPS Take 5,000 Units by mouth daily.   Yes [provider]  diclofenac Sodium (VOLTAREN) 1 % GEL Apply 2 g topically 4 (four) times daily as needed (Knee/Ankle Pain).   Yes [provider]  fluticasone (FLONASE) 50 MCG/ACT nasal spray Place 2 sprays into both nostrils daily. 02/12/22  Yes Cayde Held, Eilleen Kempf, MD  Krill Oil 1000 MG CAPS Take 1,000 mg by mouth daily.   Yes [provider]  levocetirizine (XYZAL) 5 MG tablet TAKE 1 TABLET BY MOUTH ONCE DAILY IN THE EVENING 10/06/22  Yes Mavery Milling, Eilleen Kempf, MD  losartan-hydrochlorothiazide (HYZAAR) 100-12.5 MG tablet Take 1 tablet by mouth daily. 02/12/22  Yes Georgina Quint, MD  metFORMIN (GLUCOPHAGE) 1000 MG tablet TAKE 1 TABLET BY MOUTH TWICE DAILY WITH MEALS 10/15/22  Yes Shenandoah Vandergriff, Eilleen Kempf, MD  montelukast (SINGULAIR) 10 MG tablet TAKE 1 TABLET BY MOUTH AT BEDTIME 09/09/22  Yes Nicholus Chandran, Eilleen Kempf, MD  Multiple Vitamin (MULTIVITAMIN) tablet Take 1 tablet by mouth daily.   Yes [provider]  omeprazole (PRILOSEC) 40 MG capsule Take 1 capsule (40 mg total) by mouth daily. 06/28/22  Yes Imogene Burn, MD  tirzepatide [facility]) 12.5 MG/0.5ML Pen Inject 12.5 mg into the skin once a week. 04/04/22  Yes Georgina Quint, MD  TRELEGY ELLIPTA 100-62.5-25 MCG/ACT AEPB Inhale 1 puff by mouth once daily 11/10/22  Yes Maanya Hippert, Eilleen Kempf, MD    No Known Allergies  Patient Active Problem List   Diagnosis Date Noted   Incisional hernia 12/10/2021   S/P hernia repair 12/10/2021   Diverticulosis 08/12/2021   History of colonic polyps 08/12/2021   Iron deficiency anemia 08/12/2021   Multiple allergies 05/01/2021   Chronic anemia 05/01/2021   Incisional hernia, without obstruction or gangrene 07/26/2020   Heart murmur 07/26/2020   Body mass index (BMI) of 40.1-44.9 in adult (HCC) 10/12/2019   Moderate persistent asthma with acute exacerbation 07/13/2019   Primary osteoarthritis involving multiple joints 07/14/2018   Chronic joint pain 07/14/2018   Dyslipidemia associated with type 2 diabetes mellitus (HCC) 01/14/2018   Hypertension associated with diabetes (HCC) 07/07/2017   Hyperlipidemia 07/07/2017    Past Medical History:  Diagnosis Date   Anemia    Asthma    Diabetes (HCC)  Hyperlipidemia    Hypertension    Obesity    osteoarthritis     Past Surgical History:  Procedure Laterality Date   COLONOSCOPY     HERNIA REPAIR     INSERTION OF MESH N/A 12/10/2021   Procedure: INSERTION OF MESH;  Surgeon: Axel Filler, MD;  Location: [facility] OR;  Service: General;  Laterality: N/A;   OVARIAN CYST REMOVAL     PARTIAL KNEE ARTHROPLASTY Left 2021   UPPER GASTROINTESTINAL ENDOSCOPY     XI ROBOTIC ASSISTED VENTRAL HERNIA N/A 12/10/2021   Procedure: ROBOTIC  INCISIONAL HERNIA REPAIR WITH MESH;  Surgeon: Axel Filler, MD;  Location: [facility] OR;  Service: General;  Laterality: N/A;    Social History   Socioeconomic History   Marital status: Single    Spouse name: Not on file   Number of children: 0   Years of education: Not on file   Highest education level: 12th grade  Occupational History   Occupation: Retired  Tobacco Use   Smoking status: Never   Smokeless tobacco: Never  Vaping Use   Vaping status: Never Used  Substance and Sexual Activity   Alcohol use: Not Currently    Comment: socially   Drug use: Never   Sexual activity: Not on file  Other Topics Concern   Not on file  Social History Narrative   Not on file   Social Determinants of Health   Financial Resource Strain: Medium Risk (05/10/2022)   Overall Financial Resource Strain (CARDIA)    Difficulty of Paying Living Expenses: Somewhat hard  Food Insecurity: Food Insecurity Present (05/10/2022)   Hunger Vital Sign    Worried About Running Out of Food in the Last Year: Sometimes true    Ran Out of Food in the Last Year: Patient declined  Transportation Needs: No Transportation Needs (05/10/2022)   PRAPARE - Administrator, Civil Service (Medical): No    Lack of Transportation (Non-Medical): No  Physical Activity: Insufficiently Active (05/10/2022)   Exercise Vital Sign    Days of Exercise per Week: 2 days    Minutes of Exercise per Session: 40 min  Stress: No Stress Concern Present (05/10/2022)   [employer] of Occupational Health - Occupational Stress Questionnaire    Feeling of Stress : Only a little  Social Connections: Unknown (05/10/2022)   Social Connection and Isolation Panel [NHANES]    Frequency of Communication with Friends and Family: Three times a week    Frequency of Social Gatherings with Friends and Family: Twice a week    Attends Religious Services: Never    Database administrator or Organizations: No    Attends Engineer, structural:  Not on file    Marital Status: Patient declined  Catering manager Violence: Not on file    Family History  Problem Relation Age of Onset   Heart disease Mother        stent   Hypertension Mother    Stroke Mother    Diabetes Sister    Hypertension Sister    Ulcerative colitis Sister    Colon cancer Neg Hx    Stomach cancer Neg Hx    Esophageal cancer Neg Hx    Rectal cancer Neg Hx    Breast cancer Neg Hx      Review of Systems  Constitutional: Negative.  Negative for chills and fever.  HENT: Negative.  Negative for congestion and sore throat.   Respiratory: Negative.  Negative for cough and shortness of  breath.   Cardiovascular: Negative.  Negative for chest pain and palpitations.  Gastrointestinal:  Negative for abdominal pain, diarrhea, nausea and vomiting.  Genitourinary: Negative.  Negative for dysuria and hematuria.  Skin: Negative.  Negative for rash.  Neurological: Negative.  Negative for dizziness and headaches.  All other systems reviewed and are negative.   Vitals:   11/12/22 0811  BP: 110/74  Pulse: 97  Temp: 98.1 F (36.7 C)  SpO2: 98%    Physical Exam Vitals reviewed.  Constitutional:      Appearance: Normal appearance.  HENT:     Head: Normocephalic.     Mouth/Throat:     Mouth: Mucous membranes are moist.     Pharynx: Oropharynx is clear.  Eyes:     Extraocular Movements: Extraocular movements intact.     Pupils: Pupils are equal, round, and reactive to light.  Cardiovascular:     Rate and Rhythm: Normal rate and regular rhythm.     Pulses: Normal pulses.     Heart sounds: Normal heart sounds.  Pulmonary:     Effort: Pulmonary effort is normal.     Breath sounds: Normal breath sounds.  Musculoskeletal:     Cervical back: No tenderness.  Lymphadenopathy:     Cervical: No cervical adenopathy.  Skin:    General: Skin is warm and dry.     Capillary Refill: Capillary refill takes less than 2 seconds.  Neurological:     General: No focal  deficit present.     Mental Status: She is alert and oriented to person, place, and time.  Psychiatric:        Mood and Affect: Mood normal.        Behavior: Behavior normal.    Wt Readings from Last 3 Encounters:  11/12/22 181 lb 8 oz (82.3 kg)  10/29/22 183 lb (83 kg)  05/14/22 198 lb 8 oz (90 kg)   Results for orders placed or performed in visit on 11/12/22 (from the past 24 hour(s))  POCT HgB A1C     Status: Abnormal   Collection Time: 11/12/22  8:22 AM  Result Value Ref Range   Hemoglobin A1C 5.7 (A) 4.0 - 5.6 %   HbA1c POC (<> result, manual entry)     HbA1c, POC (prediabetic range)     HbA1c, POC (controlled diabetic range)       ASSESSMENT & PLAN: A total of 44 minutes was spent with the patient and counseling/coordination of care regarding preparing for this visit, review of most recent office visit notes, review of multiple chronic medical conditions under management, review of most recent blood work results including interpretation of today's hemoglobin A1c, review of all medications, cardiovascular risks associated with hypertension and diabetes, education on nutrition, review of health maintenance items, prognosis, documentation, and need for follow-up.  Problem List Items Addressed This Visit       Cardiovascular and Mediastinum   Hypertension associated with diabetes (HCC) - Primary    Well-controlled hypertension Continue amlodipine 10 mg and Hyzaar 100-12.5 mg daily Well-controlled diabetes with hemoglobin A1c of 5.7 Eating better and losing weight Continue metformin 1000 mg twice a day and weekly Mounjaro 12.5 mg Cardiovascular risks associated with hypertension and diabetes discussed Diet and nutrition discussed Follow-up in 6 months      Relevant Orders   POCT HgB A1C (Completed)     Respiratory   Moderate persistent asthma with acute exacerbation    Well-controlled on daily Trelegy No recent use of rescue inhaler  albuterol        Endocrine    Dyslipidemia associated with type 2 diabetes mellitus (HCC)    Chronic stable conditions with hemoglobin A1c of 5.7 Continue metformin and Mounjaro Continue atorvastatin 40 mg daily Diet and nutrition discussed       Relevant Orders   POCT HgB A1C (Completed)     Musculoskeletal and Integument   Primary osteoarthritis involving multiple joints    Stable and well-controlled Pain management discussed        Other   Body mass index (BMI) of 40.1-44.9 in adult (HCC)    Wt Readings from Last 3 Encounters:  11/12/22 181 lb 8 oz (82.3 kg)  10/29/22 183 lb (83 kg)  05/14/22 198 lb 8 oz (90 kg)  Eating better and losing weight. Feeling much better        Patient Instructions  Health Maintenance After Age 55 After age 11, you are at a higher risk for certain long-term diseases and infections as well as injuries from falls. Falls are a major cause of broken bones and head injuries in people who are older than age 68. Getting regular preventive care can help to keep you healthy and well. Preventive care includes getting regular testing and making lifestyle changes as recommended by your health care provider. Talk with your health care provider about: Which screenings and tests you should have. A screening is a test that checks for a disease when you have no symptoms. A diet and exercise plan that is right for you. What should I know about screenings and tests to prevent falls? Screening and testing are the best ways to find a health problem early. Early diagnosis and treatment give you the best chance of managing medical conditions that are common after age 43. Certain conditions and lifestyle choices may make you more likely to have a fall. Your health care provider may recommend: Regular vision checks. Poor vision and conditions such as cataracts can make you more likely to have a fall. If you wear glasses, make sure to get your prescription updated if your vision changes. Medicine  review. Work with your health care provider to regularly review all of the medicines you are taking, including over-the-counter medicines. Ask your health care provider about any side effects that may make you more likely to have a fall. Tell your health care provider if any medicines that you take make you feel dizzy or sleepy. Strength and balance checks. Your health care provider may recommend certain tests to check your strength and balance while standing, walking, or changing positions. Foot health exam. Foot pain and numbness, as well as not wearing proper footwear, can make you more likely to have a fall. Screenings, including: Osteoporosis screening. Osteoporosis is a condition that causes the bones to get weaker and break more easily. Blood pressure screening. Blood pressure changes and medicines to control blood pressure can make you feel dizzy. Depression screening. You may be more likely to have a fall if you have a fear of falling, feel depressed, or feel unable to do activities that you used to do. Alcohol use screening. Using too much alcohol can affect your balance and may make you more likely to have a fall. Follow these instructions at home: Lifestyle Do not drink alcohol if: Your health care provider tells you not to drink. If you drink alcohol: Limit how much you have to: 0-1 drink a day for women. 0-2 drinks a day for men. Know how much alcohol is  in your drink. In the [place] one drink equals one 12 oz bottle of beer (355 mL), one 5 oz glass of wine (148 mL), or one 1 oz glass of hard liquor (44 mL). Do not use any products that contain nicotine or tobacco. These products include cigarettes, chewing tobacco, and vaping devices, such as e-cigarettes. If you need help quitting, ask your health care provider. Activity  Follow a regular exercise program to stay fit. This will help you maintain your balance. Ask your health care provider what types of exercise are appropriate for  you. If you need a cane or walker, use it as recommended by your health care provider. Wear supportive shoes that have nonskid soles. Safety  Remove any tripping hazards, such as rugs, cords, and clutter. Install safety equipment such as grab bars in bathrooms and safety rails on stairs. Keep rooms and walkways well-lit. General instructions Talk with your health care provider about your risks for falling. Tell your health care provider if: You fall. Be sure to tell your health care provider about all falls, even ones that seem minor. You feel dizzy, tiredness (fatigue), or off-balance. Take over-the-counter and prescription medicines only as told by your health care provider. These include supplements. Eat a healthy diet and maintain a healthy weight. A healthy diet includes low-fat dairy products, low-fat (lean) meats, and fiber from whole grains, beans, and lots of fruits and vegetables. Stay current with your vaccines. Schedule regular health, dental, and eye exams. Summary Having a healthy lifestyle and getting preventive care can help to protect your health and wellness after age 63. Screening and testing are the best way to find a health problem early and help you avoid having a fall. Early diagnosis and treatment give you the best chance for managing medical conditions that are more common for people who are older than age 37. Falls are a major cause of broken bones and head injuries in people who are older than age 68. Take precautions to prevent a fall at home. Work with your health care provider to learn what changes you can make to improve your health and wellness and to prevent falls. This information is not intended to replace advice given to you by your health care provider. Make sure you discuss any questions you have with your health care provider. Document Revised: 06/11/2020 Document Reviewed: 06/11/2020 Elsevier Patient Education  2024 Elsevier Inc.      Edwina Barth, MD [facility]

## 2022-11-12 NOTE — Assessment & Plan Note (Signed)
Well-controlled on daily Trelegy No recent use of rescue inhaler albuterol

## 2022-11-12 NOTE — Assessment & Plan Note (Signed)
Chronic stable conditions with hemoglobin A1c of 5.7 Continue metformin and Mounjaro Continue atorvastatin 40 mg daily Diet and nutrition discussed

## 2022-11-12 NOTE — Assessment & Plan Note (Signed)
Well-controlled hypertension Continue amlodipine 10 mg and Hyzaar 100-12.5 mg daily Well-controlled diabetes with hemoglobin A1c of 5.7 Eating better and losing weight Continue metformin 1000 mg twice a day and weekly Mounjaro 12.5 mg Cardiovascular risks associated with hypertension and diabetes discussed Diet and nutrition discussed Follow-up in 6 months

## 2022-11-12 NOTE — Assessment & Plan Note (Signed)
Wt Readings from Last 3 Encounters:  11/12/22 181 lb 8 oz (82.3 kg)  10/29/22 183 lb (83 kg)  05/14/22 198 lb 8 oz (90 kg)  Eating better and losing weight. Feeling much better

## 2022-11-12 NOTE — Patient Instructions (Signed)
Health Maintenance After Age 65 After age 65, you are at a higher risk for certain long-term diseases and infections as well as injuries from falls. Falls are a major cause of broken bones and head injuries in people who are older than age 65. Getting regular preventive care can help to keep you healthy and well. Preventive care includes getting regular testing and making lifestyle changes as recommended by your health care provider. Talk with your health care provider about: Which screenings and tests you should have. A screening is a test that checks for a disease when you have no symptoms. A diet and exercise plan that is right for you. What should I know about screenings and tests to prevent falls? Screening and testing are the best ways to find a health problem early. Early diagnosis and treatment give you the best chance of managing medical conditions that are common after age 65. Certain conditions and lifestyle choices may make you more likely to have a fall. Your health care provider may recommend: Regular vision checks. Poor vision and conditions such as cataracts can make you more likely to have a fall. If you wear glasses, make sure to get your prescription updated if your vision changes. Medicine review. Work with your health care provider to regularly review all of the medicines you are taking, including over-the-counter medicines. Ask your health care provider about any side effects that may make you more likely to have a fall. Tell your health care provider if any medicines that you take make you feel dizzy or sleepy. Strength and balance checks. Your health care provider may recommend certain tests to check your strength and balance while standing, walking, or changing positions. Foot health exam. Foot pain and numbness, as well as not wearing proper footwear, can make you more likely to have a fall. Screenings, including: Osteoporosis screening. Osteoporosis is a condition that causes  the bones to get weaker and break more easily. Blood pressure screening. Blood pressure changes and medicines to control blood pressure can make you feel dizzy. Depression screening. You may be more likely to have a fall if you have a fear of falling, feel depressed, or feel unable to do activities that you used to do. Alcohol use screening. Using too much alcohol can affect your balance and may make you more likely to have a fall. Follow these instructions at home: Lifestyle Do not drink alcohol if: Your health care provider tells you not to drink. If you drink alcohol: Limit how much you have to: 0-1 drink a day for women. 0-2 drinks a day for men. Know how much alcohol is in your drink. In the [place] one drink equals one 12 oz bottle of beer (355 mL), one 5 oz glass of wine (148 mL), or one 1 oz glass of hard liquor (44 mL). Do not use any products that contain nicotine or tobacco. These products include cigarettes, chewing tobacco, and vaping devices, such as e-cigarettes. If you need help quitting, ask your health care provider. Activity  Follow a regular exercise program to stay fit. This will help you maintain your balance. Ask your health care provider what types of exercise are appropriate for you. If you need a cane or walker, use it as recommended by your health care provider. Wear supportive shoes that have nonskid soles. Safety  Remove any tripping hazards, such as rugs, cords, and clutter. Install safety equipment such as grab bars in bathrooms and safety rails on stairs. Keep rooms and walkways   well-lit. General instructions Talk with your health care provider about your risks for falling. Tell your health care provider if: You fall. Be sure to tell your health care provider about all falls, even ones that seem minor. You feel dizzy, tiredness (fatigue), or off-balance. Take over-the-counter and prescription medicines only as told by your health care provider. These include  supplements. Eat a healthy diet and maintain a healthy weight. A healthy diet includes low-fat dairy products, low-fat (lean) meats, and fiber from whole grains, beans, and lots of fruits and vegetables. Stay current with your vaccines. Schedule regular health, dental, and eye exams. Summary Having a healthy lifestyle and getting preventive care can help to protect your health and wellness after age 65. Screening and testing are the best way to find a health problem early and help you avoid having a fall. Early diagnosis and treatment give you the best chance for managing medical conditions that are more common for people who are older than age 65. Falls are a major cause of broken bones and head injuries in people who are older than age 65. Take precautions to prevent a fall at home. Work with your health care provider to learn what changes you can make to improve your health and wellness and to prevent falls. This information is not intended to replace advice given to you by your health care provider. Make sure you discuss any questions you have with your health care provider. Document Revised: 06/11/2020 Document Reviewed: 06/11/2020 Elsevier Patient Education  2024 Elsevier Inc.  

## 2022-12-02 ENCOUNTER — Other Ambulatory Visit: Payer: Self-pay | Admitting: Emergency Medicine

## 2022-12-07 ENCOUNTER — Other Ambulatory Visit: Payer: Self-pay | Admitting: Emergency Medicine

## 2022-12-08 ENCOUNTER — Other Ambulatory Visit: Payer: Self-pay | Admitting: Emergency Medicine

## 2022-12-08 DIAGNOSIS — Z8709 Personal history of other diseases of the respiratory system: Secondary | ICD-10-CM

## 2022-12-19 ENCOUNTER — Other Ambulatory Visit: Payer: Self-pay | Admitting: Internal Medicine

## 2022-12-19 ENCOUNTER — Ambulatory Visit: Payer: 59

## 2022-12-19 VITALS — Ht 62.0 in | Wt 182.0 lb

## 2022-12-19 DIAGNOSIS — E1159 Type 2 diabetes mellitus with other circulatory complications: Secondary | ICD-10-CM

## 2022-12-19 DIAGNOSIS — I152 Hypertension secondary to endocrine disorders: Secondary | ICD-10-CM

## 2022-12-19 DIAGNOSIS — Z780 Asymptomatic menopausal state: Secondary | ICD-10-CM | POA: Diagnosis not present

## 2022-12-19 DIAGNOSIS — Z0000 Encounter for general adult medical examination without abnormal findings: Secondary | ICD-10-CM | POA: Diagnosis not present

## 2022-12-19 NOTE — Progress Notes (Addendum)
Subjective:   Catherine Cline is a 67 y.o. female who presents for an Initial Medicare Annual Wellness Visit.  Visit Complete: Virtual I connected with  Catherine Cline on 12/19/22 by a audio enabled telemedicine application and verified that I am speaking with the correct person using two identifiers.  Patient Location: Home  Provider Location: Office/Clinic  I discussed the limitations of evaluation and management by telemedicine. The patient expressed understanding and agreed to proceed.  Vital Signs: Because this visit was a virtual/telehealth visit, some criteria may be missing or patient reported. Any vitals not documented were not able to be obtained and vitals that have been documented are patient reported.  Patient Medicare AWV questionnaire was completed by the patient on 12/15/2022; I have confirmed that all information answered by patient is correct and no changes since this date.  Cardiac Risk Factors include: advanced age (>71men, >58 women);hypertension;diabetes mellitus;dyslipidemia     Objective:    Today's Vitals   12/19/22 0908  Weight: 182 lb (82.6 kg)  Height: 5\' 2"  (1.575 m)   Body mass index is 33.29 kg/m.     12/19/2022    9:13 AM 12/02/2021    9:12 AM 04/25/2019    8:42 AM  Advanced Directives  Does Patient Have a Medical Advance Directive? Yes Yes No  Type of Estate agent of [place];Living will Healthcare Power of [place];Living will   Does patient want to make changes to medical advance directive?  No - Patient declined   Copy of Healthcare Power of Attorney in Chart? No - copy requested No - copy requested   Would patient like information on creating a medical advance directive?   No - Patient declined    Current Medications (verified) Outpatient Encounter Medications as of 12/19/2022  Medication Sig   albuterol (PROVENTIL) (2.5 MG/3ML) 0.083% nebulizer solution Take 3 mLs (2.5 mg total) by nebulization every 6 (six)  hours as needed for wheezing or shortness of breath.   amLODipine (NORVASC) 10 MG tablet Take 1 tablet by mouth once daily   aspirin EC 81 MG tablet Take 81 mg by mouth every evening.   atorvastatin (LIPITOR) 40 MG tablet Take 1 tablet by mouth once daily   Cholecalciferol (VITAMIN D3) 5000 units CAPS Take 5,000 Units by mouth daily.   diclofenac Sodium (VOLTAREN) 1 % GEL Apply 2 g topically 4 (four) times daily as needed (Knee/Ankle Pain).   fluticasone (FLONASE) 50 MCG/ACT nasal spray Place 2 sprays into both nostrils daily.   Krill Oil 1000 MG CAPS Take 1,000 mg by mouth daily.   levocetirizine (XYZAL) 5 MG tablet TAKE 1 TABLET BY MOUTH ONCE DAILY IN THE EVENING   losartan-hydrochlorothiazide (HYZAAR) 100-12.5 MG tablet Take 1 tablet by mouth daily.   metFORMIN (GLUCOPHAGE) 1000 MG tablet TAKE 1 TABLET BY MOUTH TWICE DAILY WITH MEALS   montelukast (SINGULAIR) 10 MG tablet TAKE 1 TABLET BY MOUTH AT BEDTIME   MOUNJARO 12.5 MG/0.5ML Pen INJECT 12.5 MG SUBCUTANEOUSLY ONCE A WEEK   Multiple Vitamin (MULTIVITAMIN) tablet Take 1 tablet by mouth daily.   TRELEGY ELLIPTA 100-62.5-25 MCG/ACT AEPB Inhale 1 puff by mouth once daily   [DISCONTINUED] omeprazole (PRILOSEC) 40 MG capsule Take 1 capsule (40 mg total) by mouth daily.   No facility-administered encounter medications on file as of 12/19/2022.    Allergies (verified) Patient has no known allergies.   History: Past Medical History:  Diagnosis Date   Anemia    Asthma  Diabetes (HCC)    Hyperlipidemia    Hypertension    Obesity    osteoarthritis    Past Surgical History:  Procedure Laterality Date   COLONOSCOPY     HERNIA REPAIR     INSERTION OF MESH N/A 12/10/2021   Procedure: INSERTION OF MESH;  Surgeon: Axel Filler, MD;  Location: [facility] OR;  Service: General;  Laterality: N/A;   OVARIAN CYST REMOVAL     PARTIAL KNEE ARTHROPLASTY Left 2021   UPPER GASTROINTESTINAL ENDOSCOPY     XI ROBOTIC ASSISTED VENTRAL HERNIA N/A  12/10/2021   Procedure: ROBOTIC INCISIONAL HERNIA REPAIR WITH MESH;  Surgeon: Axel Filler, MD;  Location: [facility] OR;  Service: General;  Laterality: N/A;   Family History  Problem Relation Age of Onset   Heart disease Mother        stent   Hypertension Mother    Stroke Mother    Diabetes Sister    Hypertension Sister    Ulcerative colitis Sister    Colon cancer Neg Hx    Stomach cancer Neg Hx    Esophageal cancer Neg Hx    Rectal cancer Neg Hx    Breast cancer Neg Hx    Social History   Socioeconomic History   Marital status: Single    Spouse name: Not on file   Number of children: 0   Years of education: Not on file   Highest education level: 12th grade  Occupational History   Occupation: Retired  Tobacco Use   Smoking status: Never   Smokeless tobacco: Never  Vaping Use   Vaping status: Never Used  Substance and Sexual Activity   Alcohol use: Not Currently    Comment: socially   Drug use: Never   Sexual activity: Not on file  Other Topics Concern   Not on file  Social History Narrative   Lives with her mother-cargiver   Social Determinants of Health   Financial Resource Strain: Low Risk  (12/15/2022)   Overall Financial Resource Strain (CARDIA)    Difficulty of Paying Living Expenses: Not hard at all  Food Insecurity: Food Insecurity Present (12/15/2022)   Hunger Vital Sign    Worried About Running Out of Food in the Last Year: Sometimes true    Ran Out of Food in the Last Year: Never true  Transportation Needs: No Transportation Needs (12/15/2022)   PRAPARE - Administrator, Civil Service (Medical): No    Lack of Transportation (Non-Medical): No  Physical Activity: Insufficiently Active (12/15/2022)   Exercise Vital Sign    Days of Exercise per Week: 2 days    Minutes of Exercise per Session: 20 min  Stress: No Stress Concern Present (12/15/2022)   [employer] of Occupational Health - Occupational Stress Questionnaire    Feeling of  Stress : Only a little  Social Connections: Unknown (12/15/2022)   Social Connection and Isolation Panel [NHANES]    Frequency of Communication with Friends and Family: Three times a week    Frequency of Social Gatherings with Friends and Family: Once a week    Attends Religious Services: Never    Database administrator or Organizations: No    Attends Engineer, structural: 1 to 4 times per year    Marital Status: Patient declined    Tobacco Counseling Counseling given: Not Answered   Clinical Intake:  Pre-visit preparation completed: Yes  Pain : No/denies pain     BMI - recorded: 33.29 Nutritional Status: BMI >  30  Obese Nutritional Risks: None Diabetes: Yes CBG done?: No Did pt. bring in CBG monitor from home?: No  How often do you need to have someone help you when you read instructions, pamphlets, or other written materials from your doctor or pharmacy?: 1 - Never  Interpreter Needed?: No  Information entered by :: Tavis Kring, RMA   Activities of Daily Living    12/15/2022    8:56 AM  In your present state of health, do you have any difficulty performing the following activities:  Hearing? 0  Vision? 0  Difficulty concentrating or making decisions? 0  Walking or climbing stairs? 1  Dressing or bathing? 0  Doing errands, shopping? 0  Preparing Food and eating ? N  Using the Toilet? N  In the past six months, have you accidently leaked urine? N  Do you have problems with loss of bowel control? N  Managing your Medications? N  Managing your Finances? N  Housekeeping or managing your Housekeeping? N    Patient Care Team: Georgina Quint, MD as PCP - General (Internal Medicine) Parke Poisson, MD as PCP - Cardiology (Cardiology)  Indicate any recent Medical Services you may have received from other than [facility] providers in the past year (date may be approximate).     Assessment:   This is a routine wellness examination for  [place].  Hearing/Vision screen Hearing Screening - Comments:: Denies hearing difficulties   Vision Screening - Comments:: Wear eyeglasses for reading   Goals Addressed               This Visit's Progress     Patient Stated (pt-stated)        Would like to walk more.      Depression Screen    12/19/2022    9:16 AM 11/12/2022    8:22 AM 05/14/2022    8:28 AM 02/12/2022    8:19 AM 11/06/2021    8:25 AM 08/12/2021    8:41 AM 05/01/2021    8:40 AM  PHQ 2/9 Scores  PHQ - 2 Score 0 0 0 0 0 0 0  PHQ- 9 Score 1          Fall Risk    12/15/2022    8:56 AM 11/12/2022    8:22 AM 05/14/2022    8:28 AM 02/12/2022    8:19 AM 11/06/2021    8:25 AM  Fall Risk   Falls in the past year? 0 0 0 0 0  Number falls in past yr: 0 0 0 0 0  Injury with Fall? 0 0 0 0 0  Risk for fall due to :  No Fall Risks No Fall Risks History of fall(s) No Fall Risks  Follow up Falls evaluation completed;Falls prevention discussed Falls evaluation completed Falls evaluation completed Falls evaluation completed Falls evaluation completed    MEDICARE RISK AT HOME: Medicare Risk at Home Any stairs in or around the home?: No If so, are there any without handrails?: No Home free of loose throw rugs in walkways, pet beds, electrical cords, etc?: Yes Adequate lighting in your home to reduce risk of falls?: Yes Life alert?: No Use of a cane, walker or w/c?: Yes Grab bars in the bathroom?: Yes Shower chair or bench in shower?: Yes Elevated toilet seat or a handicapped toilet?: Yes  TIMED UP AND GO:  Was the test performed? No    Cognitive Function:        12/19/2022    9:14  AM  6CIT Screen  What Year? 0 points  What month? 0 points  What time? 0 points  Count back from 20 0 points  Months in reverse 4 points  Repeat phrase 0 points  Total Score 4 points    Immunizations Immunization History  Administered Date(s) Administered   Fluad Quad(high Dose 65+) 11/06/2021   Influenza, Quadrivalent,  Recombinant, Inj, Pf 10/05/2018   Influenza,inj,Quad PF,6+ Mos 01/14/2018   Influenza-Unspecified 12/28/2020, 10/16/2022   Moderna SARS-COV2 Booster Vaccination 12/26/2019   Moderna Sars-Covid-2 Vaccination 04/13/2019, 05/11/2019   PFIZER(Purple Top)SARS-COV-2 Vaccination 10/16/2022   PNEUMOCOCCAL CONJUGATE-20 11/06/2021   Pneumococcal Polysaccharide-23 07/14/2018   Tdap 07/07/2017   Zoster Recombinant(Shingrix) 07/12/2020    TDAP status: Up to date  Flu Vaccine status: Up to date  Pneumococcal vaccine status: Up to date  Covid-19 vaccine status: Information provided on how to obtain vaccines.   Qualifies for Shingles Vaccine? Yes   Zostavax completed Yes   Shingrix Completed?: No.    Education has been provided regarding the importance of this vaccine. Patient has been advised to call insurance company to determine out of pocket expense if they have not yet received this vaccine. Advised may also receive vaccine at local pharmacy or Health Dept. Verbalized acceptance and understanding.  Screening Tests Health Maintenance  Topic Date Due   Zoster Vaccines- Shingrix (2 of 2) 09/06/2020   Fecal DNA (Cologuard)  07/19/2021   DEXA SCAN  Never done   Diabetic kidney evaluation - eGFR measurement  12/03/2022   COVID-19 Vaccine (5 - 2023-24 season) 01/04/2023 (Originally 12/11/2022)   FOOT EXAM  05/04/2023 (Originally 10/11/2020)   OPHTHALMOLOGY EXAM  02/11/2023   HEMOGLOBIN A1C  05/13/2023   Diabetic kidney evaluation - Urine ACR  09/24/2023   Medicare Annual Wellness (AWV)  12/19/2023   MAMMOGRAM  02/22/2024   DTaP/Tdap/Td (2 - Td or Tdap) 07/08/2027   Pneumonia Vaccine 31+ Years old  Completed   INFLUENZA VACCINE  Completed   Hepatitis C Screening  Completed   HPV VACCINES  Aged Out   Colonoscopy  Discontinued    Health Maintenance  Health Maintenance Due  Topic Date Due   Zoster Vaccines- Shingrix (2 of 2) 09/06/2020   Fecal DNA (Cologuard)  07/19/2021   DEXA SCAN  Never  done   Diabetic kidney evaluation - eGFR measurement  12/03/2022    Colorectal cancer screening: Type of screening: Colonoscopy. Completed 08/01/2021. Repeat every N/A years  Mammogram status: Completed 02/21/2022. Repeat every year  Bone Density status: Ordered 12/19/2022. Pt provided with contact info and advised to call to schedule appt.  Lung Cancer Screening: (Low Dose CT Chest recommended if Age 105-80 years, 20 pack-year currently smoking OR have quit w/in 15years.) does not qualify.   Lung Cancer Screening Referral: N/A  Additional Screening:  Hepatitis C Screening: does qualify; Completed 06/12/2021  Vision Screening: Recommended annual ophthalmology exams for early detection of glaucoma and other disorders of the eye. Is the patient up to date with their annual eye exam?  Yes  Who is the provider or what is the name of the office in which the patient attends annual eye exams? Dr. Emily Filbert If pt is not established with a provider, would they like to be referred to a provider to establish care? No .   Dental Screening: Recommended annual dental exams for proper oral hygiene  Diabetic Foot Exam: Diabetic Foot Exam: Overdue, Pt has been advised about the importance in completing this exam. Pt is scheduled for  diabetic foot exam on N/A.  Community Resource Referral / Chronic Care Management: CRR required this visit?  No   CCM required this visit?  No     Plan:     I have personally reviewed and noted the following in the patient's chart:   Medical and social history Use of alcohol, tobacco or illicit drugs  Current medications and supplements including opioid prescriptions. Patient is not currently taking opioid prescriptions. Functional ability and status Nutritional status Physical activity Advanced directives List of other physicians Hospitalizations, surgeries, and ER visits in previous 12 months Vitals Screenings to include cognitive, depression, and  falls Referrals and appointments  In addition, I have reviewed and discussed with patient certain preventive protocols, quality metrics, and best practice recommendations. A written personalized care plan for preventive services as well as general preventive health recommendations were provided to patient.     Maxximus Gotay L Undra Harriman, CMA   12/19/2022   After Visit Summary: (MyChart) Due to this being a telephonic visit, the after visit summary with patients personalized plan was offered to patient via MyChart   Nurse Notes: Patient is due for a DEXA and a kidney evaluation, which orders have been placed today for when she has her next office visit.  Patient is aware that she will call to schedule the DEXA.  She had no other concerns to address today.

## 2022-12-19 NOTE — Patient Instructions (Signed)
Catherine Cline , Thank you for taking time to come for your Medicare Wellness Visit. I appreciate your ongoing commitment to your health goals. Please review the following plan we discussed and let me know if I can assist you in the future.   Referrals/Orders/Follow-Ups/Clinician Recommendations: It was nice talking to you today.  You have an order for:   [x]   Bone Density     Please call for appointment:  [facility] [address] [place], [place] [address] [phone]  Make sure to wear two-piece clothing.  No lotions, powders, or deodorants the day of the appointment. Make sure to bring picture ID and insurance card.  Bring list of medications you are currently taking including any supplements.   Schedule your Edie screening mammogram through MyChart!   Log into your MyChart account.  Go to 'Visit' (or 'Appointments' if on mobile App) --> Schedule an Appointment  Under 'Select a Reason for Visit' choose the Mammogram Screening option.  Complete the pre-visit questions and select the time and place that best fits your schedule.    This is a list of the screening recommended for you and due dates:  Health Maintenance  Topic Date Due   Zoster (Shingles) Vaccine (2 of 2) 09/06/2020   Cologuard (Stool DNA test)  07/19/2021   DEXA scan (bone density measurement)  Never done   Yearly kidney function blood test for diabetes  12/03/2022   COVID-19 Vaccine (5 - 2023-24 season) 01/04/2023*   Complete foot exam   05/04/2023*   Eye exam for diabetics  02/11/2023   Hemoglobin A1C  05/13/2023   Yearly kidney health urinalysis for diabetes  09/24/2023   Medicare Annual Wellness Visit  12/19/2023   Mammogram  02/22/2024   DTaP/Tdap/Td vaccine (2 - Td or Tdap) 07/08/2027   Pneumonia Vaccine  Completed   Flu Shot  Completed   Hepatitis C Screening  Completed   HPV Vaccine  Aged Out   Colon Cancer Screening  Discontinued  *Topic was postponed. The date  shown is not the original due date.    Advanced directives: (Copy Requested) Please bring a copy of your health care power of attorney and living will to the office to be added to your chart at your convenience.  Next Medicare Annual Wellness Visit scheduled for next year: Yes

## 2023-01-02 ENCOUNTER — Other Ambulatory Visit: Payer: Self-pay | Admitting: Emergency Medicine

## 2023-01-07 ENCOUNTER — Other Ambulatory Visit: Payer: Self-pay | Admitting: Emergency Medicine

## 2023-01-07 DIAGNOSIS — Z8709 Personal history of other diseases of the respiratory system: Secondary | ICD-10-CM

## 2023-01-08 ENCOUNTER — Other Ambulatory Visit: Payer: Self-pay | Admitting: Emergency Medicine

## 2023-01-08 DIAGNOSIS — E1159 Type 2 diabetes mellitus with other circulatory complications: Secondary | ICD-10-CM

## 2023-02-03 ENCOUNTER — Other Ambulatory Visit: Payer: Self-pay | Admitting: Emergency Medicine

## 2023-02-03 DIAGNOSIS — Z8709 Personal history of other diseases of the respiratory system: Secondary | ICD-10-CM

## 2023-02-06 ENCOUNTER — Other Ambulatory Visit: Payer: Self-pay | Admitting: Emergency Medicine

## 2023-02-06 DIAGNOSIS — E1159 Type 2 diabetes mellitus with other circulatory complications: Secondary | ICD-10-CM

## 2023-02-20 ENCOUNTER — Other Ambulatory Visit: Payer: Self-pay | Admitting: Emergency Medicine

## 2023-03-03 ENCOUNTER — Encounter: Payer: Self-pay | Admitting: Emergency Medicine

## 2023-03-03 DIAGNOSIS — H524 Presbyopia: Secondary | ICD-10-CM | POA: Diagnosis not present

## 2023-03-03 DIAGNOSIS — H02834 Dermatochalasis of left upper eyelid: Secondary | ICD-10-CM | POA: Diagnosis not present

## 2023-03-03 DIAGNOSIS — H02831 Dermatochalasis of right upper eyelid: Secondary | ICD-10-CM | POA: Diagnosis not present

## 2023-03-03 LAB — HM DIABETES EYE EXAM

## 2023-03-05 ENCOUNTER — Other Ambulatory Visit: Payer: Self-pay | Admitting: Emergency Medicine

## 2023-03-05 DIAGNOSIS — Z8709 Personal history of other diseases of the respiratory system: Secondary | ICD-10-CM

## 2023-03-07 ENCOUNTER — Other Ambulatory Visit: Payer: Self-pay | Admitting: Emergency Medicine

## 2023-03-14 ENCOUNTER — Other Ambulatory Visit: Payer: Self-pay | Admitting: Internal Medicine

## 2023-03-29 ENCOUNTER — Other Ambulatory Visit: Payer: Self-pay | Admitting: Emergency Medicine

## 2023-04-02 ENCOUNTER — Other Ambulatory Visit: Payer: Self-pay | Admitting: Emergency Medicine

## 2023-04-02 DIAGNOSIS — Z8709 Personal history of other diseases of the respiratory system: Secondary | ICD-10-CM

## 2023-04-03 ENCOUNTER — Other Ambulatory Visit: Payer: Self-pay | Admitting: Emergency Medicine

## 2023-04-03 DIAGNOSIS — E1159 Type 2 diabetes mellitus with other circulatory complications: Secondary | ICD-10-CM

## 2023-04-06 ENCOUNTER — Ambulatory Visit (INDEPENDENT_AMBULATORY_CARE_PROVIDER_SITE_OTHER): Payer: 59 | Admitting: Emergency Medicine

## 2023-04-06 ENCOUNTER — Encounter: Payer: Self-pay | Admitting: Emergency Medicine

## 2023-04-06 VITALS — BP 124/88 | HR 84 | Temp 98.0°F | Ht 62.0 in | Wt 180.0 lb

## 2023-04-06 DIAGNOSIS — Z1329 Encounter for screening for other suspected endocrine disorder: Secondary | ICD-10-CM | POA: Diagnosis not present

## 2023-04-06 DIAGNOSIS — E785 Hyperlipidemia, unspecified: Secondary | ICD-10-CM | POA: Diagnosis not present

## 2023-04-06 DIAGNOSIS — Z13228 Encounter for screening for other metabolic disorders: Secondary | ICD-10-CM | POA: Diagnosis not present

## 2023-04-06 DIAGNOSIS — Z7985 Long-term (current) use of injectable non-insulin antidiabetic drugs: Secondary | ICD-10-CM

## 2023-04-06 DIAGNOSIS — M150 Primary generalized (osteo)arthritis: Secondary | ICD-10-CM

## 2023-04-06 DIAGNOSIS — E1159 Type 2 diabetes mellitus with other circulatory complications: Secondary | ICD-10-CM

## 2023-04-06 DIAGNOSIS — Z130 Encounter for screening for diseases of the blood and blood-forming organs and certain disorders involving the immune mechanism: Secondary | ICD-10-CM

## 2023-04-06 DIAGNOSIS — E1169 Type 2 diabetes mellitus with other specified complication: Secondary | ICD-10-CM

## 2023-04-06 DIAGNOSIS — Z8709 Personal history of other diseases of the respiratory system: Secondary | ICD-10-CM | POA: Diagnosis not present

## 2023-04-06 DIAGNOSIS — Z7984 Long term (current) use of oral hypoglycemic drugs: Secondary | ICD-10-CM

## 2023-04-06 DIAGNOSIS — Z0000 Encounter for general adult medical examination without abnormal findings: Secondary | ICD-10-CM | POA: Diagnosis not present

## 2023-04-06 DIAGNOSIS — I152 Hypertension secondary to endocrine disorders: Secondary | ICD-10-CM

## 2023-04-06 LAB — MICROALBUMIN / CREATININE URINE RATIO
Creatinine,U: 112.9 mg/dL
Microalb Creat Ratio: 10.4 mg/g (ref 0.0–30.0)
Microalb, Ur: 1.2 mg/dL (ref 0.0–1.9)

## 2023-04-06 LAB — LIPID PANEL
Cholesterol: 126 mg/dL (ref 0–200)
HDL: 48.2 mg/dL (ref >39.00)
LDL Cholesterol: 59 mg/dL (ref 0–99)
NonHDL: 78.28
Total CHOL/HDL Ratio: 3
Triglycerides: 95 mg/dL (ref 0.0–149.0)
VLDL: 19 mg/dL (ref 0.0–40.0)

## 2023-04-06 LAB — CBC WITH DIFFERENTIAL/PLATELET
Basophils Absolute: 0 10*3/uL (ref 0.0–0.1)
Basophils Relative: 0.2 % (ref 0.0–3.0)
Eosinophils Absolute: 0.1 10*3/uL (ref 0.0–0.7)
Eosinophils Relative: 1.3 % (ref 0.0–5.0)
HCT: 39.7 % (ref 36.0–46.0)
Hemoglobin: 12.5 g/dL (ref 12.0–15.0)
Lymphocytes Relative: 18.9 % (ref 12.0–46.0)
Lymphs Abs: 1.3 10*3/uL (ref 0.7–4.0)
MCHC: 31.6 g/dL (ref 30.0–36.0)
MCV: 78.4 fl (ref 78.0–100.0)
Monocytes Absolute: 0.3 10*3/uL (ref 0.1–1.0)
Monocytes Relative: 3.8 % (ref 3.0–12.0)
Neutro Abs: 5.3 10*3/uL (ref 1.4–7.7)
Neutrophils Relative %: 75.8 % (ref 43.0–77.0)
Platelets: 392 10*3/uL (ref 150.0–400.0)
RBC: 5.06 Mil/uL (ref 3.87–5.11)
RDW: 15.8 % — ABNORMAL HIGH (ref 11.5–15.5)
WBC: 7 10*3/uL (ref 4.0–10.5)

## 2023-04-06 LAB — COMPREHENSIVE METABOLIC PANEL
ALT: 17 U/L (ref 0–35)
AST: 14 U/L (ref 0–37)
Albumin: 4.3 g/dL (ref 3.5–5.2)
Alkaline Phosphatase: 92 U/L (ref 39–117)
BUN: 23 mg/dL (ref 6–23)
CO2: 27 meq/L (ref 19–32)
Calcium: 9.9 mg/dL (ref 8.4–10.5)
Chloride: 103 meq/L (ref 96–112)
Creatinine, Ser: 0.8 mg/dL (ref 0.40–1.20)
GFR: 76.66 mL/min (ref >60.00)
Glucose, Bld: 80 mg/dL (ref 70–99)
Potassium: 4.7 meq/L (ref 3.5–5.1)
Sodium: 137 meq/L (ref 135–145)
Total Bilirubin: 0.3 mg/dL (ref 0.2–1.2)
Total Protein: 7.5 g/dL (ref 6.0–8.3)

## 2023-04-06 LAB — HEMOGLOBIN A1C: Hgb A1c MFr Bld: 5.9 % (ref 4.6–6.5)

## 2023-04-06 NOTE — Assessment & Plan Note (Signed)
Well-controlled hypertension Continue amlodipine 10 mg and Hyzaar 100-12.5 mg daily Well-controlled diabetes with hemoglobin A1c of 5.7 Eating better and losing weight Continue metformin 1000 mg twice a day and weekly Mounjaro 12.5 mg Cardiovascular risks associated with hypertension and diabetes discussed Diet and nutrition discussed Follow-up in 6 months

## 2023-04-06 NOTE — Progress Notes (Signed)
 Catherine Cline 68 y.o.   Chief Complaint  Patient presents with   Annual Exam    Patient wanted to ask about the kidney evaluation. No other concerns     HISTORY OF PRESENT ILLNESS: This is a 69 y.o. female A1A here for annual exam. History of diabetes and hypertension Overall doing well. Has no complaints or medical concerns today. Recently had at home visit with [place] healthcare representative.  Only advice was to check her kidneys.  Lab Results  Component Value Date   HGBA1C 5.7 (A) 11/12/2022   Wt Readings from Last 3 Encounters:  04/06/23 180 lb (81.6 kg)  12/19/22 182 lb (82.6 kg)  11/12/22 181 lb 8 oz (82.3 kg)     HPI   Prior to Admission medications   Medication Sig Start Date End Date Taking? Authorizing Provider  albuterol (PROVENTIL) (2.5 MG/3ML) 0.083% nebulizer solution Take 3 mLs (2.5 mg total) by nebulization every 6 (six) hours as needed for wheezing or shortness of breath. 01/31/22  Yes Freddy Finner, NP  amLODipine (NORVASC) 10 MG tablet Take 1 tablet by mouth once daily 06/24/22  Yes Robena Ewy, Eilleen Kempf, MD  aspirin EC 81 MG tablet Take 81 mg by mouth every evening.   Yes [provider]  atorvastatin (LIPITOR) 40 MG tablet Take 1 tablet by mouth once daily 07/23/22  Yes Charnita Trudel, Eilleen Kempf, MD  Cholecalciferol (VITAMIN D3) 5000 units CAPS Take 5,000 Units by mouth daily.   Yes [provider]  diclofenac Sodium (VOLTAREN) 1 % GEL Apply 2 g topically 4 (four) times daily as needed (Knee/Ankle Pain).   Yes [provider]  levocetirizine (XYZAL) 5 MG tablet TAKE 1 TABLET BY MOUTH ONCE DAILY IN THE EVENING 03/29/23  Yes Jeidy Hoerner, Eilleen Kempf, MD  losartan-hydrochlorothiazide [facility]) 100-12.5 MG tablet Take 1 tablet by mouth once daily 02/06/23  Yes Robb Sibal, Eilleen Kempf, MD  metFORMIN (GLUCOPHAGE) 1000 MG tablet TAKE 1 TABLET BY MOUTH TWICE DAILY WITH MEALS 04/05/23  Yes Marcellene Shivley, Eilleen Kempf, MD  montelukast (SINGULAIR) 10 MG  tablet TAKE 1 TABLET BY MOUTH AT BEDTIME 03/07/23  Yes Georgina Quint, MD  MOUNJARO 12.5 MG/0.5ML Pen INJECT 12.5 MG SUBCUTANEOUSLY ONCE A WEEK 02/20/23  Yes Georgina Quint, MD  omeprazole (PRILOSEC) 40 MG capsule Take 1 capsule by mouth once daily 03/16/23  Yes Imogene Burn, MD  TRELEGY ELLIPTA 100-62.5-25 MCG/ACT AEPB INHALE 1 PUFF INTO LUNGS ONCE DAILY 04/02/23  Yes Leyan Branden, Eilleen Kempf, MD  fluticasone [facility]) 50 MCG/ACT nasal spray Place 2 sprays into both nostrils daily. 02/12/22   Georgina Quint, MD  Krill Oil 1000 MG CAPS Take 1,000 mg by mouth daily.    [provider]  Multiple Vitamin (MULTIVITAMIN) tablet Take 1 tablet by mouth daily.    [provider]    No Known Allergies  Patient Active Problem List   Diagnosis Date Noted   Incisional hernia 12/10/2021   S/P hernia repair 12/10/2021   Diverticulosis 08/12/2021   History of colonic polyps 08/12/2021   Iron deficiency anemia 08/12/2021   Multiple allergies 05/01/2021   Chronic anemia 05/01/2021   Incisional hernia, without obstruction or gangrene 07/26/2020   Heart murmur 07/26/2020   Body mass index (BMI) of 40.1-44.9 in adult [facility]) 10/12/2019   Moderate persistent asthma with acute exacerbation 07/13/2019   Primary osteoarthritis involving multiple joints 07/14/2018   Chronic joint pain 07/14/2018   Dyslipidemia associated with type 2 diabetes mellitus (HCC) 01/14/2018  Hypertension associated with diabetes (HCC) 07/07/2017   Hyperlipidemia 07/07/2017    Past Medical History:  Diagnosis Date   Anemia    Asthma    Diabetes (HCC)    Hyperlipidemia    Hypertension    Obesity    osteoarthritis     Past Surgical History:  Procedure Laterality Date   COLONOSCOPY     HERNIA REPAIR     INSERTION OF MESH N/A 12/10/2021   Procedure: INSERTION OF MESH;  Surgeon: Axel Filler, MD;  Location: [facility] OR;  Service: General;  Laterality: N/A;   OVARIAN CYST REMOVAL     PARTIAL  KNEE ARTHROPLASTY Left 2021   UPPER GASTROINTESTINAL ENDOSCOPY     XI ROBOTIC ASSISTED VENTRAL HERNIA N/A 12/10/2021   Procedure: ROBOTIC INCISIONAL HERNIA REPAIR WITH MESH;  Surgeon: Axel Filler, MD;  Location: [facility] OR;  Service: General;  Laterality: N/A;    Social History   Socioeconomic History   Marital status: Single    Spouse name: Not on file   Number of children: 0   Years of education: Not on file   Highest education level: 12th grade  Occupational History   Occupation: Retired  Tobacco Use   Smoking status: Never   Smokeless tobacco: Never  Vaping Use   Vaping status: Never Used  Substance and Sexual Activity   Alcohol use: Not Currently    Comment: socially   Drug use: Never   Sexual activity: Not on file  Other Topics Concern   Not on file  Social History Narrative   Lives with her mother-cargiver   Social Drivers of Health   Financial Resource Strain: Low Risk  (04/03/2023)   Overall Financial Resource Strain (CARDIA)    Difficulty of Paying Living Expenses: Not very hard  Food Insecurity: Food Insecurity Present (04/03/2023)   Hunger Vital Sign    Worried About Running Out of Food in the Last Year: Sometimes true    Ran Out of Food in the Last Year: Sometimes true  Transportation Needs: No Transportation Needs (04/03/2023)   PRAPARE - Administrator, Civil Service (Medical): No    Lack of Transportation (Non-Medical): No  Physical Activity: Insufficiently Active (04/03/2023)   Exercise Vital Sign    Days of Exercise per Week: 3 days    Minutes of Exercise per Session: 20 min  Stress: No Stress Concern Present (04/03/2023)   [employer] of Occupational Health - Occupational Stress Questionnaire    Feeling of Stress : Only a little  Social Connections: Unknown (04/03/2023)   Social Connection and Isolation Panel [NHANES]    Frequency of Communication with Friends and Family: More than three times a week    Frequency of Social Gatherings  with Friends and Family: Three times a week    Attends Religious Services: Never    Active Member of Clubs or Organizations: No    Attends Banker Meetings: 1 to 4 times per year    Marital Status: Patient declined  Intimate Partner Violence: Patient Unable To Answer (12/19/2022)   Humiliation, Afraid, Rape, and Kick questionnaire    Fear of Current or Ex-Partner: Patient unable to answer    Emotionally Abused: Patient unable to answer    Physically Abused: Patient unable to answer    Sexually Abused: Patient unable to answer    Family History  Problem Relation Age of Onset   Heart disease Mother        stent   Hypertension Mother  Stroke Mother    Diabetes Sister    Hypertension Sister    Ulcerative colitis Sister    Colon cancer Neg Hx    Stomach cancer Neg Hx    Esophageal cancer Neg Hx    Rectal cancer Neg Hx    Breast cancer Neg Hx      Review of Systems  Constitutional: Negative.  Negative for chills and fever.  HENT: Negative.  Negative for congestion and sore throat.   Respiratory: Negative.  Negative for cough and shortness of breath.   Cardiovascular: Negative.  Negative for chest pain and palpitations.  Gastrointestinal:  Negative for abdominal pain, diarrhea, nausea and vomiting.  Genitourinary: Negative.  Negative for dysuria and hematuria.  Musculoskeletal:  Positive for joint pain.  Skin: Negative.  Negative for rash.  Neurological: Negative.  Negative for dizziness and headaches.  All other systems reviewed and are negative.   Vitals:   04/06/23 0936  BP: 124/88  Pulse: 84  Temp: 98 F (36.7 C)  SpO2: 94%    Physical Exam Vitals reviewed.  Constitutional:      Appearance: Normal appearance.  HENT:     Head: Normocephalic.     Right Ear: Tympanic membrane, ear canal and external ear normal.     Left Ear: Tympanic membrane, ear canal and external ear normal.     Mouth/Throat:     Mouth: Mucous membranes are moist.     Pharynx:  Oropharynx is clear.  Eyes:     Extraocular Movements: Extraocular movements intact.     Conjunctiva/sclera: Conjunctivae normal.     Pupils: Pupils are equal, round, and reactive to light.  Cardiovascular:     Rate and Rhythm: Normal rate and regular rhythm.     Pulses: Normal pulses.     Heart sounds: Normal heart sounds.  Pulmonary:     Effort: Pulmonary effort is normal.     Breath sounds: Normal breath sounds.  Abdominal:     Palpations: Abdomen is soft.     Tenderness: There is no abdominal tenderness.  Musculoskeletal:     Cervical back: No tenderness.     Right lower leg: No edema.     Left lower leg: No edema.  Lymphadenopathy:     Cervical: No cervical adenopathy.  Skin:    General: Skin is warm and dry.     Capillary Refill: Capillary refill takes less than 2 seconds.  Neurological:     General: No focal deficit present.     Mental Status: She is alert and oriented to person, place, and time.  Psychiatric:        Mood and Affect: Mood normal.        Behavior: Behavior normal.      ASSESSMENT & PLAN: Problem List Items Addressed This Visit       Cardiovascular and Mediastinum   Hypertension associated with diabetes (HCC)   Well-controlled hypertension Continue amlodipine 10 mg and Hyzaar 100-12.5 mg daily Well-controlled diabetes with hemoglobin A1c of 5.7 Eating better and losing weight Continue metformin 1000 mg twice a day and weekly Mounjaro 12.5 mg Cardiovascular risks associated with hypertension and diabetes discussed Diet and nutrition discussed Follow-up in 6 months      Relevant Orders   Microalbumin / creatinine urine ratio   Comprehensive metabolic panel   CBC with Differential/Platelet   Hemoglobin A1c   Lipid panel     Endocrine   Dyslipidemia associated with type 2 diabetes mellitus (HCC)   Chronic stable  conditions with hemoglobin A1c of 5.7 Continue metformin and Mounjaro Continue atorvastatin 40 mg daily Diet and nutrition  discussed      Relevant Orders   Microalbumin / creatinine urine ratio   Comprehensive metabolic panel   CBC with Differential/Platelet   Hemoglobin A1c   Lipid panel     Musculoskeletal and Integument   Primary osteoarthritis involving multiple joints   Stable and well-controlled Pain management discussed      Relevant Orders   CBC with Differential/Platelet     Other   History of asthma   Well-controlled on daily Trelegy No recent use of rescue inhaler albuterol      Other Visit Diagnoses       Routine general medical examination at a health care facility    -  Primary   Relevant Orders   Microalbumin / creatinine urine ratio   Comprehensive metabolic panel   CBC with Differential/Platelet   Hemoglobin A1c   Lipid panel     Screening for deficiency anemia       Relevant Orders   CBC with Differential/Platelet     Screening for endocrine, metabolic and immunity disorder       Relevant Orders   Comprehensive metabolic panel      Modifiable risk factors discussed with patient. Anticipatory guidance according to age provided. The following topics were also discussed: Social Determinants of Health Smoking.  Non-smoker Diet and nutrition Benefits of exercise Cancer screening and review of most recent colonoscopy and mammogram reports Vaccinations review and recommendations Cardiovascular risk assessment and need for blood work Review of multiple chronic medical conditions under management Review of all medications Mental health including depression and anxiety Fall and accident prevention  Patient Instructions  Health Maintenance, Female Adopting a healthy lifestyle and getting preventive care are important in promoting health and wellness. Ask your health care provider about: The right schedule for you to have regular tests and exams. Things you can do on your own to prevent diseases and keep yourself healthy. What should I know about diet, weight, and  exercise? Eat a healthy diet  Eat a diet that includes plenty of vegetables, fruits, low-fat dairy products, and lean protein. Do not eat a lot of foods that are high in solid fats, added sugars, or sodium. Maintain a healthy weight Body mass index (BMI) is used to identify weight problems. It estimates body fat based on height and weight. Your health care provider can help determine your BMI and help you achieve or maintain a healthy weight. Get regular exercise Get regular exercise. This is one of the most important things you can do for your health. Most adults should: Exercise for at least 150 minutes each week. The exercise should increase your heart rate and make you sweat (moderate-intensity exercise). Do strengthening exercises at least twice a week. This is in addition to the moderate-intensity exercise. Spend less time sitting. Even light physical activity can be beneficial. Watch cholesterol and blood lipids Have your blood tested for lipids and cholesterol at 67 years of age, then have this test every 5 years. Have your cholesterol levels checked more often if: Your lipid or cholesterol levels are high. You are older than 67 years of age. You are at high risk for heart disease. What should I know about cancer screening? Depending on your health history and family history, you may need to have cancer screening at various ages. This may include screening for: Breast cancer. Cervical cancer. Colorectal cancer. Skin cancer. Lung  cancer. What should I know about heart disease, diabetes, and high blood pressure? Blood pressure and heart disease High blood pressure causes heart disease and increases the risk of stroke. This is more likely to develop in people who have high blood pressure readings or are overweight. Have your blood pressure checked: Every 3-5 years if you are 58-49 years of age. Every year if you are 23 years old or older. Diabetes Have regular diabetes  screenings. This checks your fasting blood sugar level. Have the screening done: Once every three years after age 52 if you are at a normal weight and have a low risk for diabetes. More often and at a younger age if you are overweight or have a high risk for diabetes. What should I know about preventing infection? Hepatitis B If you have a higher risk for hepatitis B, you should be screened for this virus. Talk with your health care provider to find out if you are at risk for hepatitis B infection. Hepatitis C Testing is recommended for: Everyone born from [birth year] through 1964. Anyone with known risk factors for hepatitis C. Sexually transmitted infections (STIs) Get screened for STIs, including gonorrhea and chlamydia, if: You are sexually active and are younger than 67 years of age. You are older than 67 years of age and your health care provider tells you that you are at risk for this type of infection. Your sexual activity has changed since you were last screened, and you are at increased risk for chlamydia or gonorrhea. Ask your health care provider if you are at risk. Ask your health care provider about whether you are at high risk for HIV. Your health care provider may recommend a prescription medicine to help prevent HIV infection. If you choose to take medicine to prevent HIV, you should first get tested for HIV. You should then be tested every 3 months for as long as you are taking the medicine. Pregnancy If you are about to stop having your period (premenopausal) and you may become pregnant, seek counseling before you get pregnant. Take 400 to 800 micrograms (mcg) of folic acid every day if you become pregnant. Ask for birth control (contraception) if you want to prevent pregnancy. Osteoporosis and menopause Osteoporosis is a disease in which the bones lose minerals and strength with aging. This can result in bone fractures. If you are 29 years old or older, or if you are at risk for  osteoporosis and fractures, ask your health care provider if you should: Be screened for bone loss. Take a calcium or vitamin D supplement to lower your risk of fractures. Be given hormone replacement therapy (HRT) to treat symptoms of menopause. Follow these instructions at home: Alcohol use Do not drink alcohol if: Your health care provider tells you not to drink. You are pregnant, may be pregnant, or are planning to become pregnant. If you drink alcohol: Limit how much you have to: 0-1 drink a day. Know how much alcohol is in your drink. In the [place] one drink equals one 12 oz bottle of beer (355 mL), one 5 oz glass of wine (148 mL), or one 1 oz glass of hard liquor (44 mL). Lifestyle Do not use any products that contain nicotine or tobacco. These products include cigarettes, chewing tobacco, and vaping devices, such as e-cigarettes. If you need help quitting, ask your health care provider. Do not use street drugs. Do not share needles. Ask your health care provider for help if you need support or  information about quitting drugs. General instructions Schedule regular health, dental, and eye exams. Stay current with your vaccines. Tell your health care provider if: You often feel depressed. You have ever been abused or do not feel safe at home. Summary Adopting a healthy lifestyle and getting preventive care are important in promoting health and wellness. Follow your health care provider's instructions about healthy diet, exercising, and getting tested or screened for diseases. Follow your health care provider's instructions on monitoring your cholesterol and blood pressure. This information is not intended to replace advice given to you by your health care provider. Make sure you discuss any questions you have with your health care provider. Document Revised: 06/11/2020 Document Reviewed: 06/11/2020 Elsevier Patient Education  2024 Elsevier Inc.    Edwina Barth, MD   Primary Care at [facility]

## 2023-04-06 NOTE — Assessment & Plan Note (Signed)
Well-controlled on daily Trelegy No recent use of rescue inhaler albuterol

## 2023-04-06 NOTE — Assessment & Plan Note (Signed)
Stable and well-controlled. Pain management discussed. 

## 2023-04-06 NOTE — Assessment & Plan Note (Signed)
Chronic stable conditions with hemoglobin A1c of 5.7 Continue metformin and Mounjaro Continue atorvastatin 40 mg daily Diet and nutrition discussed

## 2023-04-06 NOTE — Patient Instructions (Signed)

## 2023-04-22 ENCOUNTER — Other Ambulatory Visit: Payer: Self-pay | Admitting: Emergency Medicine

## 2023-04-22 DIAGNOSIS — Z1231 Encounter for screening mammogram for malignant neoplasm of breast: Secondary | ICD-10-CM

## 2023-05-01 ENCOUNTER — Other Ambulatory Visit: Payer: Self-pay | Admitting: Emergency Medicine

## 2023-05-01 DIAGNOSIS — Z8709 Personal history of other diseases of the respiratory system: Secondary | ICD-10-CM

## 2023-05-04 ENCOUNTER — Other Ambulatory Visit: Payer: Self-pay | Admitting: Emergency Medicine

## 2023-05-04 DIAGNOSIS — E1159 Type 2 diabetes mellitus with other circulatory complications: Secondary | ICD-10-CM

## 2023-05-11 ENCOUNTER — Ambulatory Visit
Admission: RE | Admit: 2023-05-11 | Discharge: 2023-05-11 | Disposition: A | Discharge status: Home / Self Care | Source: Ambulatory Visit | Attending: Emergency Medicine

## 2023-05-11 DIAGNOSIS — Z1231 Encounter for screening mammogram for malignant neoplasm of breast: Secondary | ICD-10-CM

## 2023-05-13 ENCOUNTER — Encounter: Payer: Self-pay | Admitting: Emergency Medicine

## 2023-05-14 ENCOUNTER — Other Ambulatory Visit: Payer: Self-pay | Admitting: Emergency Medicine

## 2023-05-26 DIAGNOSIS — R071 Chest pain on breathing: Secondary | ICD-10-CM | POA: Diagnosis not present

## 2023-05-26 DIAGNOSIS — S2231XA Fracture of one rib, right side, initial encounter for closed fracture: Secondary | ICD-10-CM | POA: Diagnosis not present

## 2023-05-26 DIAGNOSIS — J9811 Atelectasis: Secondary | ICD-10-CM | POA: Diagnosis not present

## 2023-05-26 DIAGNOSIS — R0782 Intercostal pain: Secondary | ICD-10-CM | POA: Diagnosis not present

## 2023-05-30 ENCOUNTER — Other Ambulatory Visit: Payer: Self-pay | Admitting: Emergency Medicine

## 2023-05-30 DIAGNOSIS — Z8709 Personal history of other diseases of the respiratory system: Secondary | ICD-10-CM

## 2023-06-02 ENCOUNTER — Other Ambulatory Visit: Payer: Self-pay | Admitting: Emergency Medicine

## 2023-06-11 ENCOUNTER — Other Ambulatory Visit: Payer: Self-pay | Admitting: Internal Medicine

## 2023-06-12 ENCOUNTER — Other Ambulatory Visit: Payer: Self-pay | Admitting: Emergency Medicine

## 2023-06-12 DIAGNOSIS — I152 Hypertension secondary to endocrine disorders: Secondary | ICD-10-CM

## 2023-06-23 ENCOUNTER — Other Ambulatory Visit: Payer: Self-pay | Admitting: Emergency Medicine

## 2023-06-24 ENCOUNTER — Ambulatory Visit (INDEPENDENT_AMBULATORY_CARE_PROVIDER_SITE_OTHER): Admitting: Internal Medicine

## 2023-06-24 ENCOUNTER — Encounter: Payer: Self-pay | Admitting: Internal Medicine

## 2023-06-24 VITALS — BP 122/80 | HR 78 | Ht 62.0 in | Wt 177.0 lb

## 2023-06-24 DIAGNOSIS — K219 Gastro-esophageal reflux disease without esophagitis: Secondary | ICD-10-CM | POA: Diagnosis not present

## 2023-06-24 MED ORDER — OMEPRAZOLE 20 MG PO CPDR: 20.0000 mg | DELAYED_RELEASE_CAPSULE | Freq: Every day | ORAL | 1 refills | Status: DC

## 2023-06-24 NOTE — Progress Notes (Signed)
 Chief Complaint: Anemia  HPI : 68 year old female with history of GERD, hiatal hernia, DM, HFpEF, obesity, hiatal hernia, and asthma presents for follow up of GERD.  Interval History: She is taking in the omeprazole  40 mg every day. Her reflux only flares up during the evening and with eating certain foods. She has had a significant lost weight with 40 lbs about since I last saw her in 07/2021 after she was started Mounjaro . She had an incisional hernia repair in 12/2021 that went well. Bowel habits have been normal. Denies abdominal pain. Denies N&V or dysphagia. Denies blood in the stools. She does have arthritis issues for which she will take Tylenol  arthritis  Wt Readings from Last 3 Encounters:  06/24/23 177 lb (80.3 kg)  04/06/23 180 lb (81.6 kg)  12/19/22 182 lb (82.6 kg)   Past Medical History:  Diagnosis Date   Anemia    Asthma    Diabetes (HCC)    Hyperlipidemia    Hypertension    Obesity    osteoarthritis    Past Surgical History:  Procedure Laterality Date   COLONOSCOPY     HERNIA REPAIR     INSERTION OF MESH N/A 12/10/2021   Procedure: INSERTION OF MESH;  Surgeon: Shela Derby, MD;  Location: [facility] OR;  Service: General;  Laterality: N/A;   OVARIAN CYST REMOVAL     PARTIAL KNEE ARTHROPLASTY Left 2021   UPPER GASTROINTESTINAL ENDOSCOPY     XI ROBOTIC ASSISTED VENTRAL HERNIA N/A 12/10/2021   Procedure: ROBOTIC INCISIONAL HERNIA REPAIR WITH MESH;  Surgeon: Shela Derby, MD;  Location: [facility] OR;  Service: General;  Laterality: N/A;   Family History  Problem Relation Age of Onset   Heart disease Mother        stent   Hypertension Mother    Stroke Mother    Diabetes Sister    Hypertension Sister    Ulcerative colitis Sister    Colon cancer Neg Hx    Stomach cancer Neg Hx    Esophageal cancer Neg Hx    Rectal cancer Neg Hx    Breast cancer Neg Hx    Social History   Tobacco Use   Smoking status: Never   Smokeless tobacco: Never  Vaping Use   Vaping  status: Never Used  Substance Use Topics   Alcohol use: Not Currently    Comment: socially   Drug use: Never   Current Outpatient Medications  Medication Sig Dispense Refill   albuterol  (PROVENTIL ) (2.5 MG/3ML) 0.083% nebulizer solution Take 3 mLs (2.5 mg total) by nebulization every 6 (six) hours as needed for wheezing or shortness of breath. 75 mL 0   amLODipine  (NORVASC ) 10 MG tablet Take 1 tablet by mouth once daily 90 tablet 3   aspirin EC 81 MG tablet Take 81 mg by mouth every evening.     atorvastatin  (LIPITOR) 40 MG tablet Take 1 tablet by mouth once daily 90 tablet 3   Cholecalciferol (VITAMIN D3) 5000 units CAPS Take 5,000 Units by mouth daily.     diclofenac  Sodium (VOLTAREN ) 1 % GEL Apply 2 g topically 4 (four) times daily as needed (Knee/Ankle Pain).     fluticasone  (FLONASE ) 50 MCG/ACT nasal spray Place 2 sprays into both nostrils daily. 18 mL 2   Krill Oil 1000 MG CAPS Take 1,000 mg by mouth daily.     levocetirizine (XYZAL ) 5 MG tablet TAKE 1 TABLET BY MOUTH ONCE DAILY IN THE EVENING 90 tablet 0  losartan -hydrochlorothiazide  (HYZAAR) 100-12.5 MG tablet Take 1 tablet by mouth once daily 90 tablet 3   metFORMIN  (GLUCOPHAGE ) 1000 MG tablet TAKE 1 TABLET BY MOUTH TWICE DAILY WITH MEALS 180 tablet 0   montelukast  (SINGULAIR ) 10 MG tablet TAKE 1 TABLET BY MOUTH AT BEDTIME 90 tablet 0   MOUNJARO  12.5 MG/0.5ML Pen INJECT 12.5 MG SUBCUTANEOUSLY ONCE A WEEK 12 mL 0   omeprazole  (PRILOSEC) 40 MG capsule Take 1 capsule by mouth once daily 90 capsule 0   TRELEGY ELLIPTA  100-62.5-25 MCG/ACT AEPB INHALE 1 PUFF INTO LUNGS ONCE DAILY 60 each 0   No current facility-administered medications for this visit.   No Known Allergies  Physical Exam: BP 122/80   Pulse 78   Ht 5\' 2"  (1.575 m)   Wt 177 lb (80.3 kg)   BMI 32.37 kg/m  Constitutional: Pleasant,well-developed, female in no acute distress. HEENT: Normocephalic and atraumatic. Conjunctivae are normal. No scleral  icterus. Cardiovascular: Normal rate, regular rhythm.  Pulmonary/chest: Effort normal and breath sounds normal. No wheezing, rales or rhonchi. Abdominal: Soft, non-distended, non-tender.  Extremities: No edema Neurological: Alert and oriented to person place and time. Skin: Skin is warm and dry. No rashes noted. Psychiatric: Normal mood and affect. Behavior is normal.  Labs 04/2021: CBC with low Hb of 10.7. CMP with mildly elevated alk phos of 123 and ALT of 59  Labs 06/2021: CBC with low Hb of 11.5. Iron sat low at 6.5%. Low ferritin of 11. INR nml. IgG nml. AMA negative. ANA negative. ASMA negative. Hepatitis B surface antibody NR. Hepaitits B surface antigen NR. Hepatitis A antibody NR. Hepatitis C antibody negative.   Labs 04/2023: CBC nml. CMP nml. HbA1C 5.9%.  CT A/P w/o contrast 10/04/20: IMPRESSION: Umbilical or supraumbilical ventral hernia containing a short portion of the mid transverse colon. No evidence of bowel obstruction. Colonic diverticulosis. Hepatic steatosis. Posterior right diaphragmatic hernia containing fat. Uterine fibroid.  EGD 08/01/21: - Salmon- colored mucosa. Biopsied. - 4 cm hiatal hernia. - Retained gastric fluid. - Gastritis. Biopsied. - Normal examined duodenum. Biopsied. Path: 1. Surgical [P], duodenal - DUODENAL MUCOSA WITHIN NORMAL LIMITS. 2. Surgical [P], gastric antrum and gastric body - gastritis - ANTRAL AND OXYNTIC MUCOSA WITH EVIDENCE OF EROSION, CHEMICAL/REACTIVE/REPARATIVE CHANGE AND MILD CHRONIC INACTIVE GASTRITIS. - NO HELICOBACTER PYLORI ORGANISMS IDENTIFIED ON H&E STAINED SLIDE. 3. Surgical [P], GE junction - SQUAMOCOLUMNAR JUNCTIONAL MUCOSA WITH FEATURES SUGGESTIVE OF REFLUX.  Colonoscopy 08/01/21: - Diverticulosis in the sigmoid colon, in the descending colon, in the transverse colon and in the ascending colon. - Three 2 to 4 mm polyps in the sigmoid colon and in the descending colon, removed with a cold snare. Resected and retrieved. -  Non- bleeding internal hemorrhoids. 4. Surgical [P], colon, sigmoid and descending, polyp (3) - HYPERPLASTIC POLYPS.  ASSESSMENT AND PLAN: Anemia - resolved Elevated LFTs - resolved GERD Patient presents for follow up of GERD. Her anemia and elevated LFTs have resolved. She has been losing weight deliberately, which has likely helped with her GERD and fatty liver. Her anemia has resolved with iron supplementation. Since patient seems to be doing well from a reflux standpoint, will decrease her omeprazole  dosage - Previously gave GERD handout - Decrease omeprazole  from 40 mg to 20 mg every day - Patient already takes calcium  and vitamin D - RTC in 1 year  Regino Caprio, MD  I spent 26 minutes of time, including independent review of results as outlined above, communicating results with the patient directly, face-to-face time  with the patient, coordinating care, ordering studies and medications as appropriate, and documentation.

## 2023-06-24 NOTE — Patient Instructions (Addendum)
 We have sent the following medications to your pharmacy for you to pick up at your convenience: Omeprazole    Follow in 1 year _______________________________________________________  If your blood pressure at your visit was 140/90 or greater, please contact your primary care physician to follow up on this.  _______________________________________________________  If you are age 67 or older, your body mass index should be between 23-30. Your Body mass index is 32.37 kg/m. If this is out of the aforementioned range listed, please consider follow up with your Primary Care Provider.  If you are age 35 or younger, your body mass index should be between 19-25. Your Body mass index is 32.37 kg/m. If this is out of the aformentioned range listed, please consider follow up with your Primary Care Provider.   ________________________________________________________  The  GI providers would like to encourage you to use MYCHART to communicate with providers for non-urgent requests or questions.  Due to long hold times on the telephone, sending your provider a message by [facility] may be a faster and more efficient way to get a response.  Please allow 48 business hours for a response.  Please remember that this is for non-urgent requests.  _______________________________________________________  Thank you for entrusting me with your care and for choosing [facility], Dr. Regino Caprio

## 2023-06-26 ENCOUNTER — Other Ambulatory Visit: Payer: Self-pay | Admitting: Emergency Medicine

## 2023-06-26 DIAGNOSIS — Z8709 Personal history of other diseases of the respiratory system: Secondary | ICD-10-CM

## 2023-06-29 ENCOUNTER — Other Ambulatory Visit: Payer: Self-pay | Admitting: Emergency Medicine

## 2023-06-29 DIAGNOSIS — E1159 Type 2 diabetes mellitus with other circulatory complications: Secondary | ICD-10-CM

## 2023-07-10 ENCOUNTER — Other Ambulatory Visit: Payer: Self-pay | Admitting: Emergency Medicine

## 2023-07-10 DIAGNOSIS — E785 Hyperlipidemia, unspecified: Secondary | ICD-10-CM

## 2023-07-26 ENCOUNTER — Other Ambulatory Visit: Payer: Self-pay | Admitting: Emergency Medicine

## 2023-07-26 DIAGNOSIS — Z8709 Personal history of other diseases of the respiratory system: Secondary | ICD-10-CM

## 2023-07-26 NOTE — Telephone Encounter (Signed)
 No content

## 2023-08-03 ENCOUNTER — Other Ambulatory Visit: Payer: Self-pay | Admitting: Emergency Medicine

## 2023-08-17 ENCOUNTER — Other Ambulatory Visit: Payer: 59

## 2023-08-24 ENCOUNTER — Other Ambulatory Visit: Payer: Self-pay | Admitting: Emergency Medicine

## 2023-08-24 DIAGNOSIS — Z8709 Personal history of other diseases of the respiratory system: Secondary | ICD-10-CM

## 2023-08-30 ENCOUNTER — Other Ambulatory Visit: Payer: Self-pay | Admitting: Emergency Medicine

## 2023-09-09 ENCOUNTER — Ambulatory Visit (HOSPITAL_BASED_OUTPATIENT_CLINIC_OR_DEPARTMENT_OTHER)
Admission: RE | Admit: 2023-09-09 | Discharge: 2023-09-09 | Disposition: A | Discharge status: Home / Self Care | Source: Ambulatory Visit | Attending: Nurse Practitioner | Admitting: Nurse Practitioner

## 2023-09-09 DIAGNOSIS — Z780 Asymptomatic menopausal state: Secondary | ICD-10-CM | POA: Diagnosis not present

## 2023-09-09 DIAGNOSIS — Z0000 Encounter for general adult medical examination without abnormal findings: Secondary | ICD-10-CM | POA: Insufficient documentation

## 2023-09-09 DIAGNOSIS — M810 Age-related osteoporosis without current pathological fracture: Secondary | ICD-10-CM | POA: Diagnosis not present

## 2023-09-19 ENCOUNTER — Ambulatory Visit: Payer: Self-pay | Admitting: Family

## 2023-09-19 ENCOUNTER — Other Ambulatory Visit: Payer: Self-pay | Admitting: Emergency Medicine

## 2023-09-23 ENCOUNTER — Other Ambulatory Visit: Payer: Self-pay | Admitting: Emergency Medicine

## 2023-09-23 DIAGNOSIS — E1159 Type 2 diabetes mellitus with other circulatory complications: Secondary | ICD-10-CM

## 2023-09-25 ENCOUNTER — Other Ambulatory Visit: Payer: Self-pay | Admitting: Emergency Medicine

## 2023-09-25 DIAGNOSIS — Z8709 Personal history of other diseases of the respiratory system: Secondary | ICD-10-CM

## 2023-10-03 ENCOUNTER — Other Ambulatory Visit: Payer: Self-pay | Admitting: Emergency Medicine

## 2023-10-03 DIAGNOSIS — E785 Hyperlipidemia, unspecified: Secondary | ICD-10-CM

## 2023-10-14 ENCOUNTER — Ambulatory Visit: Payer: Self-pay | Admitting: Emergency Medicine

## 2023-10-14 ENCOUNTER — Ambulatory Visit: Admitting: Emergency Medicine

## 2023-10-14 ENCOUNTER — Encounter: Payer: Self-pay | Admitting: Emergency Medicine

## 2023-10-14 VITALS — BP 112/78 | HR 89 | Temp 97.7°F | Ht 62.0 in | Wt 178.0 lb

## 2023-10-14 DIAGNOSIS — Z7984 Long term (current) use of oral hypoglycemic drugs: Secondary | ICD-10-CM

## 2023-10-14 DIAGNOSIS — E785 Hyperlipidemia, unspecified: Secondary | ICD-10-CM

## 2023-10-14 DIAGNOSIS — Z23 Encounter for immunization: Secondary | ICD-10-CM

## 2023-10-14 DIAGNOSIS — E1159 Type 2 diabetes mellitus with other circulatory complications: Secondary | ICD-10-CM | POA: Diagnosis not present

## 2023-10-14 DIAGNOSIS — M810 Age-related osteoporosis without current pathological fracture: Secondary | ICD-10-CM

## 2023-10-14 DIAGNOSIS — E1169 Type 2 diabetes mellitus with other specified complication: Secondary | ICD-10-CM | POA: Diagnosis not present

## 2023-10-14 DIAGNOSIS — M150 Primary generalized (osteo)arthritis: Secondary | ICD-10-CM

## 2023-10-14 DIAGNOSIS — I152 Hypertension secondary to endocrine disorders: Secondary | ICD-10-CM | POA: Diagnosis not present

## 2023-10-14 LAB — CBC WITH DIFFERENTIAL/PLATELET
Basophils Absolute: 0 K/uL (ref 0.0–0.1)
Basophils Relative: 0.5 % (ref 0.0–3.0)
Eosinophils Absolute: 0.2 K/uL (ref 0.0–0.7)
Eosinophils Relative: 2.1 % (ref 0.0–5.0)
HCT: 38.7 % (ref 36.0–46.0)
Hemoglobin: 12.2 g/dL (ref 12.0–15.0)
Lymphocytes Relative: 21.2 % (ref 12.0–46.0)
Lymphs Abs: 1.7 K/uL (ref 0.7–4.0)
MCHC: 31.7 g/dL (ref 30.0–36.0)
MCV: 77.6 fl — ABNORMAL LOW (ref 78.0–100.0)
Monocytes Absolute: 0.3 K/uL (ref 0.1–1.0)
Monocytes Relative: 4.2 % (ref 3.0–12.0)
Neutro Abs: 5.7 K/uL (ref 1.4–7.7)
Neutrophils Relative %: 72 % (ref 43.0–77.0)
Platelets: 389 K/uL (ref 150.0–400.0)
RBC: 4.98 Mil/uL (ref 3.87–5.11)
RDW: 15.3 % (ref 11.5–15.5)
WBC: 7.9 K/uL (ref 4.0–10.5)

## 2023-10-14 LAB — COMPREHENSIVE METABOLIC PANEL WITH GFR
ALT: 14 U/L (ref 0–35)
AST: 13 U/L (ref 0–37)
Albumin: 4.4 g/dL (ref 3.5–5.2)
Alkaline Phosphatase: 90 U/L (ref 39–117)
BUN: 30 mg/dL — ABNORMAL HIGH (ref 6–23)
CO2: 25 meq/L (ref 19–32)
Calcium: 9.8 mg/dL (ref 8.4–10.5)
Chloride: 101 meq/L (ref 96–112)
Creatinine, Ser: 0.91 mg/dL (ref 0.40–1.20)
GFR: 65.44 mL/min (ref >60.00)
Glucose, Bld: 84 mg/dL (ref 70–99)
Potassium: 4.4 meq/L (ref 3.5–5.1)
Sodium: 136 meq/L (ref 135–145)
Total Bilirubin: 0.4 mg/dL (ref 0.2–1.2)
Total Protein: 8 g/dL (ref 6.0–8.3)

## 2023-10-14 LAB — LIPID PANEL
Cholesterol: 136 mg/dL (ref 0–200)
HDL: 47.3 mg/dL (ref >39.00)
LDL Cholesterol: 67 mg/dL (ref 0–99)
NonHDL: 88.94
Total CHOL/HDL Ratio: 3
Triglycerides: 110 mg/dL (ref 0.0–149.0)
VLDL: 22 mg/dL (ref 0.0–40.0)

## 2023-10-14 LAB — VITAMIN B12: Vitamin B-12: 310 pg/mL (ref 211–911)

## 2023-10-14 LAB — HEMOGLOBIN A1C: Hgb A1c MFr Bld: 6 % (ref 4.6–6.5)

## 2023-10-14 MED ORDER — COVID-19 MRNA VAC-TRIS(PFIZER) 30 MCG/0.3ML IM SUSY: 0.3000 mL | PREFILLED_SYRINGE | Freq: Once | INTRAMUSCULAR | 0 refills | Status: AC

## 2023-10-14 NOTE — Assessment & Plan Note (Signed)
Chronic stable conditions with hemoglobin A1c of 5.7 Continue metformin and Mounjaro Continue atorvastatin 40 mg daily Diet and nutrition discussed

## 2023-10-14 NOTE — Assessment & Plan Note (Addendum)
 BP Readings from Last 3 Encounters:  10/14/23 112/78  06/24/23 122/80  04/06/23 124/88   Lab Results  Component Value Date   HGBA1C 5.9 04/06/2023  Well-controlled hypertension Continue amlodipine  10 mg and Hyzaar 100-12.5 mg daily Well-controlled diabetes with hemoglobin A1c of 5.7 Eating better and losing weight Continue metformin  1000 mg twice a day and weekly Mounjaro  12.5 mg Cardiovascular risks associated with hypertension and diabetes discussed Diet and nutrition discussed Follow-up in 6 months

## 2023-10-14 NOTE — Patient Instructions (Signed)
 Health Maintenance After Age 67 After age 27, you are at a higher risk for certain long-term diseases and infections as well as injuries from falls. Falls are a major cause of broken bones and head injuries in people who are older than age 73. Getting regular preventive care can help to keep you healthy and well. Preventive care includes getting regular testing and making lifestyle changes as recommended by your health care provider. Talk with your health care provider about: Which screenings and tests you should have. A screening is a test that checks for a disease when you have no symptoms. A diet and exercise plan that is right for you. What should I know about screenings and tests to prevent falls? Screening and testing are the best ways to find a health problem early. Early diagnosis and treatment give you the best chance of managing medical conditions that are common after age 90. Certain conditions and lifestyle choices may make you more likely to have a fall. Your health care provider may recommend: Regular vision checks. Poor vision and conditions such as cataracts can make you more likely to have a fall. If you wear glasses, make sure to get your prescription updated if your vision changes. Medicine review. Work with your health care provider to regularly review all of the medicines you are taking, including over-the-counter medicines. Ask your health care provider about any side effects that may make you more likely to have a fall. Tell your health care provider if any medicines that you take make you feel dizzy or sleepy. Strength and balance checks. Your health care provider may recommend certain tests to check your strength and balance while standing, walking, or changing positions. Foot health exam. Foot pain and numbness, as well as not wearing proper footwear, can make you more likely to have a fall. Screenings, including: Osteoporosis screening. Osteoporosis is a condition that causes  the bones to get weaker and break more easily. Blood pressure screening. Blood pressure changes and medicines to control blood pressure can make you feel dizzy. Depression screening. You may be more likely to have a fall if you have a fear of falling, feel depressed, or feel unable to do activities that you used to do. Alcohol  use screening. Using too much alcohol  can affect your balance and may make you more likely to have a fall. Follow these instructions at home: Lifestyle Do not drink alcohol  if: Your health care provider tells you not to drink. If you drink alcohol : Limit how much you have to: 0-1 drink a day for women. 0-2 drinks a day for men. Know how much alcohol  is in your drink. In the [place] one drink equals one 12 oz bottle of beer (355 mL), one 5 oz glass of wine (148 mL), or one 1 oz glass of hard liquor (44 mL). Do not use any products that contain nicotine or tobacco. These products include cigarettes, chewing tobacco, and vaping devices, such as e-cigarettes. If you need help quitting, ask your health care provider. Activity  Follow a regular exercise program to stay fit. This will help you maintain your balance. Ask your health care provider what types of exercise are appropriate for you. If you need a cane or walker, use it as recommended by your health care provider. Wear supportive shoes that have nonskid soles. Safety  Remove any tripping hazards, such as rugs, cords, and clutter. Install safety equipment such as grab bars in bathrooms and safety rails on stairs. Keep rooms and walkways  well-lit. General instructions Talk with your health care provider about your risks for falling. Tell your health care provider if: You fall. Be sure to tell your health care provider about all falls, even ones that seem minor. You feel dizzy, tiredness (fatigue), or off-balance. Take over-the-counter and prescription medicines only as told by your health care provider. These include  supplements. Eat a healthy diet and maintain a healthy weight. A healthy diet includes low-fat dairy products, low-fat (lean) meats, and fiber from whole grains, beans, and lots of fruits and vegetables. Stay current with your vaccines. Schedule regular health, dental, and eye exams. Summary Having a healthy lifestyle and getting preventive care can help to protect your health and wellness after age 15. Screening and testing are the best way to find a health problem early and help you avoid having a fall. Early diagnosis and treatment give you the best chance for managing medical conditions that are more common for people who are older than age 42. Falls are a major cause of broken bones and head injuries in people who are older than age 64. Take precautions to prevent a fall at home. Work with your health care provider to learn what changes you can make to improve your health and wellness and to prevent falls. This information is not intended to replace advice given to you by your health care provider. Make sure you discuss any questions you have with your health care provider. Document Revised: 06/11/2020 Document Reviewed: 06/11/2020 Elsevier Patient Education  2024 [employer].

## 2023-10-14 NOTE — Assessment & Plan Note (Signed)
 Clinically stable. Recent bone scan report reviewed with patient Will refer to osteoporosis management clinic

## 2023-10-14 NOTE — Assessment & Plan Note (Signed)
Stable and well-controlled. Pain management discussed. 

## 2023-10-14 NOTE — Progress Notes (Signed)
 Catherine Cline 68 y.o.   Chief Complaint  Patient presents with   Follow-up    Patient here for 6 month f/u for HTN/ DM. Patient wanting to go over Bone density results.     HISTORY OF PRESENT ILLNESS: This is a 68 y.o. female here for follow-up of chronic medical conditions including hypertension and diabetes Recent bone scan shows osteoporosis Overall doing well.  Has no complaints or any other medical concerns today. Wt Readings from Last 3 Encounters:  10/14/23 178 lb (80.7 kg)  06/24/23 177 lb (80.3 kg)  04/06/23 180 lb (81.6 kg)     HPI   Prior to Admission medications   Medication Sig Start Date End Date Taking? Authorizing Provider  albuterol  (PROVENTIL ) (2.5 MG/3ML) 0.083% nebulizer solution Take 3 mLs (2.5 mg total) by nebulization every 6 (six) hours as needed for wheezing or shortness of breath. 01/31/22  Yes Moishe Chiquita HERO, NP  amLODipine  (NORVASC ) 10 MG tablet Take 1 tablet by mouth once daily 06/12/23  Yes Donn Wilmot, Emil Schanz, MD  aspirin EC 81 MG tablet Take 81 mg by mouth every evening.   Yes [provider]  atorvastatin  (LIPITOR) 40 MG tablet Take 1 tablet by mouth once daily 10/03/23  Yes Atlanta Pelto Jose, MD  Cholecalciferol (VITAMIN D3) 5000 units CAPS Take 5,000 Units by mouth daily.   Yes [provider]  diclofenac  Sodium (VOLTAREN ) 1 % GEL Apply 2 g topically 4 (four) times daily as needed (Knee/Ankle Pain).   Yes [provider]  fluticasone  (FLONASE ) 50 MCG/ACT nasal spray Place 2 sprays into both nostrils daily. 02/12/22  Yes Francisco Eyerly, Emil Schanz, MD  Krill Oil 1000 MG CAPS Take 1,000 mg by mouth daily.   Yes [provider]  levocetirizine (XYZAL ) 5 MG tablet TAKE 1 TABLET BY MOUTH ONCE DAILY IN THE EVENING 09/20/23  Yes Hayleen Clinkscales Jose, MD  losartan -hydrochlorothiazide  [facility]) 100-12.5 MG tablet Take 1 tablet by mouth once daily 05/04/23  Yes Rashon Westrup Jose, MD  metFORMIN  (GLUCOPHAGE ) 1000 MG  tablet TAKE 1 TABLET BY MOUTH TWICE DAILY WITH MEALS 09/23/23  Yes Kenzie Flakes, Emil Schanz, MD  montelukast  (SINGULAIR ) 10 MG tablet TAKE 1 TABLET BY MOUTH AT BEDTIME 08/30/23  Yes Yuritzi Kamp, Emil Schanz, MD  MOUNJARO  12.5 MG/0.5ML Pen INJECT 12.5 MG SUBCUTANEOUSLY ONCE A WEEK 08/03/23  Yes Patrycja Mumpower, Emil Schanz, MD  omeprazole  (PRILOSEC) 20 MG capsule Take 1 capsule (20 mg total) by mouth daily. 06/24/23  Yes Federico Rosario BROCKS, MD  TRELEGY ELLIPTA  100-62.5-25 MCG/ACT AEPB Inhale 1 puff by mouth once daily 09/25/23  Yes Alvie Fowles, Emil Schanz, MD    No Known Allergies  Patient Active Problem List   Diagnosis Date Noted   History of asthma 04/06/2023   Incisional hernia 12/10/2021   S/P hernia repair 12/10/2021   Diverticulosis 08/12/2021   History of colonic polyps 08/12/2021   Iron deficiency anemia 08/12/2021   Multiple allergies 05/01/2021   Chronic anemia 05/01/2021   Incisional hernia, without obstruction or gangrene 07/26/2020   Heart murmur 07/26/2020   Moderate persistent asthma with acute exacerbation 07/13/2019   Primary osteoarthritis involving multiple joints 07/14/2018   Chronic joint pain 07/14/2018   Dyslipidemia associated with type 2 diabetes mellitus (HCC) 01/14/2018   Hypertension associated with diabetes (HCC) 07/07/2017   Hyperlipidemia 07/07/2017    Past Medical History:  Diagnosis Date   Anemia    Asthma    Diabetes (HCC)    Hyperlipidemia    Hypertension  Obesity    osteoarthritis     Past Surgical History:  Procedure Laterality Date   COLONOSCOPY     HERNIA REPAIR     INSERTION OF MESH N/A 12/10/2021   Procedure: INSERTION OF MESH;  Surgeon: Rubin Calamity, MD;  Location: [facility] OR;  Service: General;  Laterality: N/A;   OVARIAN CYST REMOVAL     PARTIAL KNEE ARTHROPLASTY Left 2021   UPPER GASTROINTESTINAL ENDOSCOPY     XI ROBOTIC ASSISTED VENTRAL HERNIA N/A 12/10/2021   Procedure: ROBOTIC INCISIONAL HERNIA REPAIR WITH MESH;  Surgeon: Rubin Calamity, MD;   Location: [facility] OR;  Service: General;  Laterality: N/A;    Social History   Socioeconomic History   Marital status: Single    Spouse name: Not on file   Number of children: 0   Years of education: Not on file   Highest education level: 12th grade  Occupational History   Occupation: Retired  Tobacco Use   Smoking status: Never   Smokeless tobacco: Never  Vaping Use   Vaping status: Never Used  Substance and Sexual Activity   Alcohol use: Not Currently    Comment: socially   Drug use: Never   Sexual activity: Not on file  Other Topics Concern   Not on file  Social History Narrative   Lives with her mother-cargiver   Social Drivers of Health   Financial Resource Strain: Low Risk  (04/03/2023)   Overall Financial Resource Strain (CARDIA)    Difficulty of Paying Living Expenses: Not very hard  Food Insecurity: Food Insecurity Present (04/03/2023)   Hunger Vital Sign    Worried About Running Out of Food in the Last Year: Sometimes true    Ran Out of Food in the Last Year: Sometimes true  Transportation Needs: No Transportation Needs (04/03/2023)   PRAPARE - Administrator, Civil Service (Medical): No    Lack of Transportation (Non-Medical): No  Physical Activity: Insufficiently Active (04/03/2023)   Exercise Vital Sign    Days of Exercise per Week: 3 days    Minutes of Exercise per Session: 20 min  Stress: No Stress Concern Present (04/03/2023)   [employer] of Occupational Health - Occupational Stress Questionnaire    Feeling of Stress : Only a little  Social Connections: Unknown (04/03/2023)   Social Connection and Isolation Panel    Frequency of Communication with Friends and Family: More than three times a week    Frequency of Social Gatherings with Friends and Family: Three times a week    Attends Religious Services: Never    Active Member of Clubs or Organizations: No    Attends Banker Meetings: 1 to 4 times per year    Marital Status:  Patient declined  Intimate Partner Violence: Patient Unable To Answer (12/19/2022)   Humiliation, Afraid, Rape, and Kick questionnaire    Fear of Current or Ex-Partner: Patient unable to answer    Emotionally Abused: Patient unable to answer    Physically Abused: Patient unable to answer    Sexually Abused: Patient unable to answer    Family History  Problem Relation Age of Onset   Heart disease Mother        stent   Hypertension Mother    Stroke Mother    Diabetes Sister    Hypertension Sister    Ulcerative colitis Sister    Colon cancer Neg Hx    Stomach cancer Neg Hx    Esophageal cancer Neg Hx  Rectal cancer Neg Hx    Breast cancer Neg Hx      Review of Systems  Constitutional: Negative.  Negative for chills and fever.  HENT: Negative.  Negative for congestion and sore throat.   Respiratory: Negative.  Negative for cough and shortness of breath.   Cardiovascular: Negative.  Negative for chest pain and palpitations.  Gastrointestinal:  Negative for abdominal pain, diarrhea, nausea and vomiting.  Genitourinary: Negative.  Negative for dysuria and hematuria.  Skin: Negative.  Negative for rash.  Neurological: Negative.  Negative for dizziness and headaches.    Vitals:   10/14/23 0808  BP: 112/78  Pulse: 89  Temp: 97.7 F (36.5 C)  SpO2: 95%    Physical Exam Vitals reviewed.  Constitutional:      Appearance: Normal appearance.  HENT:     Head: Normocephalic.     Mouth/Throat:     Mouth: Mucous membranes are moist.     Pharynx: Oropharynx is clear.  Eyes:     Extraocular Movements: Extraocular movements intact.     Pupils: Pupils are equal, round, and reactive to light.  Cardiovascular:     Rate and Rhythm: Normal rate and regular rhythm.     Pulses: Normal pulses.     Heart sounds: Normal heart sounds.  Pulmonary:     Effort: Pulmonary effort is normal.     Breath sounds: Normal breath sounds.  Musculoskeletal:     Cervical back: No tenderness.   Lymphadenopathy:     Cervical: No cervical adenopathy.  Skin:    General: Skin is warm and dry.  Neurological:     Mental Status: She is alert and oriented to person, place, and time.  Psychiatric:        Mood and Affect: Mood normal.        Behavior: Behavior normal.      ASSESSMENT & PLAN: A total of 42 minutes was spent with the patient and counseling/coordination of care regarding preparing for this visit, review of most recent office visit notes, review of multiple chronic medical conditions and their management, review of all medications, review of most recent bloodwork results, review of health maintenance items, education on nutrition, prognosis, documentation, and need for follow up.  Problem List Items Addressed This Visit       Cardiovascular and Mediastinum   Hypertension associated with diabetes (HCC) - Primary   BP Readings from Last 3 Encounters:  10/14/23 112/78  06/24/23 122/80  04/06/23 124/88   Lab Results  Component Value Date   HGBA1C 5.9 04/06/2023  Well-controlled hypertension Continue amlodipine  10 mg and Hyzaar 100-12.5 mg daily Well-controlled diabetes with hemoglobin A1c of 5.7 Eating better and losing weight Continue metformin  1000 mg twice a day and weekly Mounjaro  12.5 mg Cardiovascular risks associated with hypertension and diabetes discussed Diet and nutrition discussed Follow-up in 6 months       Relevant Orders   CBC with Differential/Platelet   Comprehensive metabolic panel with GFR   Hemoglobin A1c   Lipid panel   Vitamin B12     Endocrine   Dyslipidemia associated with type 2 diabetes mellitus (HCC)   Chronic stable conditions with hemoglobin A1c of 5.7 Continue metformin  and Mounjaro  Continue atorvastatin  40 mg daily Diet and nutrition discussed      Relevant Orders   CBC with Differential/Platelet   Comprehensive metabolic panel with GFR   Hemoglobin A1c   Lipid panel   Vitamin B12     Musculoskeletal and  Integument  Primary osteoarthritis involving multiple joints   Stable and well-controlled Pain management discussed      Age-related osteoporosis without current pathological fracture   Clinically stable. Recent bone scan report reviewed with patient Will refer to osteoporosis management clinic      Relevant Orders   Amb Referral to Osteoporosis Management    Other Visit Diagnoses       Need for vaccination       Relevant Medications   COVID-19 mRNA vaccine, Pfizer, (COMIRNATY) syringe      Patient Instructions  Health Maintenance After Age 56 After age 99, you are at a higher risk for certain long-term diseases and infections as well as injuries from falls. Falls are a major cause of broken bones and head injuries in people who are older than age 47. Getting regular preventive care can help to keep you healthy and well. Preventive care includes getting regular testing and making lifestyle changes as recommended by your health care provider. Talk with your health care provider about: Which screenings and tests you should have. A screening is a test that checks for a disease when you have no symptoms. A diet and exercise plan that is right for you. What should I know about screenings and tests to prevent falls? Screening and testing are the best ways to find a health problem early. Early diagnosis and treatment give you the best chance of managing medical conditions that are common after age 32. Certain conditions and lifestyle choices may make you more likely to have a fall. Your health care provider may recommend: Regular vision checks. Poor vision and conditions such as cataracts can make you more likely to have a fall. If you wear glasses, make sure to get your prescription updated if your vision changes. Medicine review. Work with your health care provider to regularly review all of the medicines you are taking, including over-the-counter medicines. Ask your health care provider  about any side effects that may make you more likely to have a fall. Tell your health care provider if any medicines that you take make you feel dizzy or sleepy. Strength and balance checks. Your health care provider may recommend certain tests to check your strength and balance while standing, walking, or changing positions. Foot health exam. Foot pain and numbness, as well as not wearing proper footwear, can make you more likely to have a fall. Screenings, including: Osteoporosis screening. Osteoporosis is a condition that causes the bones to get weaker and break more easily. Blood pressure screening. Blood pressure changes and medicines to control blood pressure can make you feel dizzy. Depression screening. You may be more likely to have a fall if you have a fear of falling, feel depressed, or feel unable to do activities that you used to do. Alcohol use screening. Using too much alcohol can affect your balance and may make you more likely to have a fall. Follow these instructions at home: Lifestyle Do not drink alcohol if: Your health care provider tells you not to drink. If you drink alcohol: Limit how much you have to: 0-1 drink a day for women. 0-2 drinks a day for men. Know how much alcohol is in your drink. In the [place] one drink equals one 12 oz bottle of beer (355 mL), one 5 oz glass of wine (148 mL), or one 1 oz glass of hard liquor (44 mL). Do not use any products that contain nicotine or tobacco. These products include cigarettes, chewing tobacco, and vaping devices, such as  e-cigarettes. If you need help quitting, ask your health care provider. Activity  Follow a regular exercise program to stay fit. This will help you maintain your balance. Ask your health care provider what types of exercise are appropriate for you. If you need a cane or walker, use it as recommended by your health care provider. Wear supportive shoes that have nonskid soles. Safety  Remove any tripping  hazards, such as rugs, cords, and clutter. Install safety equipment such as grab bars in bathrooms and safety rails on stairs. Keep rooms and walkways well-lit. General instructions Talk with your health care provider about your risks for falling. Tell your health care provider if: You fall. Be sure to tell your health care provider about all falls, even ones that seem minor. You feel dizzy, tiredness (fatigue), or off-balance. Take over-the-counter and prescription medicines only as told by your health care provider. These include supplements. Eat a healthy diet and maintain a healthy weight. A healthy diet includes low-fat dairy products, low-fat (lean) meats, and fiber from whole grains, beans, and lots of fruits and vegetables. Stay current with your vaccines. Schedule regular health, dental, and eye exams. Summary Having a healthy lifestyle and getting preventive care can help to protect your health and wellness after age 55. Screening and testing are the best way to find a health problem early and help you avoid having a fall. Early diagnosis and treatment give you the best chance for managing medical conditions that are more common for people who are older than age 26. Falls are a major cause of broken bones and head injuries in people who are older than age 66. Take precautions to prevent a fall at home. Work with your health care provider to learn what changes you can make to improve your health and wellness and to prevent falls. This information is not intended to replace advice given to you by your health care provider. Make sure you discuss any questions you have with your health care provider. Document Revised: 06/11/2020 Document Reviewed: 06/11/2020 Elsevier Patient Education  2024 [employer]       Emil Schaumann, MD [facility] at [facility]

## 2023-10-22 ENCOUNTER — Other Ambulatory Visit: Payer: Self-pay | Admitting: Emergency Medicine

## 2023-10-22 DIAGNOSIS — Z8709 Personal history of other diseases of the respiratory system: Secondary | ICD-10-CM

## 2023-11-05 ENCOUNTER — Encounter: Payer: Self-pay | Admitting: Physician Assistant

## 2023-11-05 ENCOUNTER — Ambulatory Visit (INDEPENDENT_AMBULATORY_CARE_PROVIDER_SITE_OTHER): Admitting: Physician Assistant

## 2023-11-05 VITALS — Ht 62.0 in | Wt 181.0 lb

## 2023-11-05 DIAGNOSIS — M810 Age-related osteoporosis without current pathological fracture: Secondary | ICD-10-CM

## 2023-11-05 NOTE — Addendum Note (Signed)
 Addended by: RODGERS LACY on: 11/05/2023 11:39 AM   Modules accepted: Orders

## 2023-11-05 NOTE — Progress Notes (Signed)
 Office Visit Note   Patient: Catherine Cline           Date of Birth: 05/17/1955           MRN: [id number] Visit Date: 11/05/2023              Requested by: Purcell Emil Schanz, MD [address] [place],  [place] [address] PCP: Purcell Emil Schanz, MD   Assessment & Plan: Visit Diagnoses:  1. Age-related osteoporosis without current pathological fracture     Plan: Patient is a pleasant 68 year old woman who is referred by Dr. Sagardia for evaluation of osteoporosis.  She currently does not take any medication specifically for osteoporosis.  She does have a history remotely of a wrist fracture as a child or recently a rib fracture.  No history of cardiac disease or cancer.  No history of ulcers or gastric bypass.  She does have reflux.  She went through menopause at age 24 without any hormone replacement therapy.  She does vitamin D 5000 international units a day she admits she does not really keep track of her calcium  though her calcium  on her labs is fine.  She is not a smoker or drinker.  She does some stretching daily.  She has had some extraction of some wisdom teeth she does have a positive history of a spine fracture in her mom about 6 years ago.  She does have an elevated risk of hip fracture particularly.  We discussed diet and I would like her to try tracking her calcium .  I discussed with her the minimum requirements for that.  We also discussed ways she could add calcium  to her diet.  She is a diabetic so she has to be careful.  We also discussed exercises and getting involved in some resistance training a couple times a week.  We spent about 45 minutes reviewing all of her results I would like to get a PTH TSH and vitamin D today.  I talked to her about recommending her for Prolia or its biosimilar.  She is willing to do this we talked extensively about the side effects and that this is a medication that she will have to continue to take for the rest of her life.  Will go forward  with authorization of Prolia.  I think more than likely she will have to go with the biosimilar and she is fine with us   Follow-Up Instructions: Return if symptoms worsen or fail to improve.   Orders:  No orders of the defined types were placed in this encounter.  No orders of the defined types were placed in this encounter.     Procedures: No procedures performed   Clinical Data: No additional findings.   Subjective: No chief complaint on file.   HPI pleasant 68 year old woman referred for evaluation of osteoporosis  Review of Systems  All other systems reviewed and are negative.    Objective: Vital Signs: Ht 5' 2 (1.575 m)   Wt 181 lb (82.1 kg)   BMI 33.11 kg/m   Physical Exam Constitutional:      Appearance: Normal appearance.  Skin:    General: Skin is warm and dry.  Neurological:     General: No focal deficit present.     Mental Status: She is alert and oriented to person, place, and time.  Psychiatric:        Mood and Affect: Mood normal.        Behavior: Behavior normal.  Specialty Comments:  No specialty comments available.  Imaging: No results found.   PMFS History: Patient Active Problem List   Diagnosis Date Noted   Age-related osteoporosis without current pathological fracture 10/14/2023   History of asthma 04/06/2023   Incisional hernia 12/10/2021   S/P hernia repair 12/10/2021   Diverticulosis 08/12/2021   History of colonic polyps 08/12/2021   Iron deficiency anemia 08/12/2021   Multiple allergies 05/01/2021   Chronic anemia 05/01/2021   Incisional hernia, without obstruction or gangrene 07/26/2020   Heart murmur 07/26/2020   Moderate persistent asthma with acute exacerbation 07/13/2019   Primary osteoarthritis involving multiple joints 07/14/2018   Chronic joint pain 07/14/2018   Dyslipidemia associated with type 2 diabetes mellitus (HCC) 01/14/2018   Hypertension associated with diabetes (HCC) 07/07/2017    Hyperlipidemia 07/07/2017   Past Medical History:  Diagnosis Date   Anemia    Asthma    Diabetes (HCC)    Hyperlipidemia    Hypertension    Obesity    osteoarthritis     Family History  Problem Relation Age of Onset   Heart disease Mother        stent   Hypertension Mother    Stroke Mother    Diabetes Sister    Hypertension Sister    Ulcerative colitis Sister    Colon cancer Neg Hx    Stomach cancer Neg Hx    Esophageal cancer Neg Hx    Rectal cancer Neg Hx    Breast cancer Neg Hx     Past Surgical History:  Procedure Laterality Date   COLONOSCOPY     HERNIA REPAIR     INSERTION OF MESH N/A 12/10/2021   Procedure: INSERTION OF MESH;  Surgeon: Rubin Calamity, MD;  Location[facility];  Service: General;  Laterality: N/A;   OVARIAN CYST REMOVAL     PARTIAL KNEE ARTHROPLASTY Left 2021   UPPER GASTROINTESTINAL ENDOSCOPY     XI ROBOTIC ASSISTED VENTRAL HERNIA N/A 12/10/2021   Procedure: ROBOTIC INCISIONAL HERNIA REPAIR WITH MESH;  Surgeon: Rubin Calamity, MD;  Location: [facility] OR;  Service: General;  Laterality: N/A;   Social History   Occupational History   Occupation: Retired  Tobacco Use   Smoking status: Never   Smokeless tobacco: Never  Vaping Use   Vaping status: Never Used  Substance and Sexual Activity   Alcohol use: Not Currently    Comment: socially   Drug use: Never   Sexual activity: Not on file

## 2023-11-07 LAB — TSH: TSH: 2.18 m[IU]/L (ref 0.40–4.50)

## 2023-11-07 LAB — VITAMIN D 25 HYDROXY (VIT D DEFICIENCY, FRACTURES): Vit D, 25-Hydroxy: 58 ng/mL (ref 30–100)

## 2023-11-07 LAB — PARATHYROID HORMONE, INTACT (NO CA): PTH: 72 pg/mL (ref 16–77)

## 2023-11-12 ENCOUNTER — Other Ambulatory Visit: Payer: Self-pay | Admitting: Physician Assistant

## 2023-11-12 DIAGNOSIS — M810 Age-related osteoporosis without current pathological fracture: Secondary | ICD-10-CM

## 2023-11-12 MED ORDER — DENOSUMAB 60 MG/ML ~~LOC~~ SOSY: 60.0000 mg | PREFILLED_SYRINGE | Freq: Once | SUBCUTANEOUS | Status: AC

## 2023-11-17 ENCOUNTER — Other Ambulatory Visit: Payer: Self-pay | Admitting: Emergency Medicine

## 2023-11-17 DIAGNOSIS — Z8709 Personal history of other diseases of the respiratory system: Secondary | ICD-10-CM

## 2023-12-01 ENCOUNTER — Other Ambulatory Visit: Payer: Self-pay | Admitting: Physician Assistant

## 2023-12-01 ENCOUNTER — Other Ambulatory Visit: Payer: Self-pay | Admitting: Emergency Medicine

## 2023-12-01 MED ORDER — DENOSUMAB-BBDZ 60 MG/ML ~~LOC~~ SOSY: 60.0000 mg | PREFILLED_SYRINGE | SUBCUTANEOUS | 0 refills | Status: AC

## 2023-12-07 ENCOUNTER — Encounter: Payer: Self-pay | Admitting: Radiology

## 2023-12-15 ENCOUNTER — Telehealth: Payer: Self-pay

## 2023-12-15 NOTE — Telephone Encounter (Signed)
 FYI  Per SP, medication will be delivered on Thursday, 12/17/2023.

## 2023-12-16 ENCOUNTER — Other Ambulatory Visit: Payer: Self-pay | Admitting: Emergency Medicine

## 2023-12-17 ENCOUNTER — Ambulatory Visit

## 2023-12-18 ENCOUNTER — Other Ambulatory Visit: Payer: Self-pay | Admitting: Emergency Medicine

## 2023-12-18 DIAGNOSIS — Z8709 Personal history of other diseases of the respiratory system: Secondary | ICD-10-CM

## 2023-12-21 ENCOUNTER — Other Ambulatory Visit: Payer: Self-pay | Admitting: Emergency Medicine

## 2023-12-21 ENCOUNTER — Ambulatory Visit: Admitting: Physician Assistant

## 2023-12-21 DIAGNOSIS — M810 Age-related osteoporosis without current pathological fracture: Secondary | ICD-10-CM | POA: Diagnosis not present

## 2023-12-21 DIAGNOSIS — E1159 Type 2 diabetes mellitus with other circulatory complications: Secondary | ICD-10-CM

## 2023-12-21 MED ORDER — DENOSUMAB-BBDZ 60 MG/ML ~~LOC~~ SOSY
60.0000 mg | PREFILLED_SYRINGE | Freq: Once | SUBCUTANEOUS | Status: AC
  Administered 2023-12-21: 60 mg via SUBCUTANEOUS

## 2023-12-21 NOTE — Progress Notes (Signed)
 Office Visit Note   Patient: Catherine Cline           Date of Birth: 1955/05/28           MRN: [id number] Visit Date: 12/21/2023              Requested by: Purcell Emil Schanz, MD [address] [place],  [place] [address] PCP: Purcell Emil Schanz, MD      HPI: Patient is a pleasant 68 year old woman who comes in for her first Jubbonti injection Assessment & Plan: Visit Diagnoses:  1. Age-related osteoporosis without current pathological fracture     Plan: Injection given without difficulty will follow-up in 6 months at that time a calcium  and vitamin D should be obtained  Follow-Up Instructions: No follow-ups on file.   Ortho Exam  Patient is alert, oriented, no adenopathy, well-dressed, normal affect, normal respiratory effort.     Imaging: No results found. No images are attached to the encounter.  Labs: Lab Results  Component Value Date   HGBA1C 6.0 10/14/2023   HGBA1C 5.9 04/06/2023   HGBA1C 5.7 (A) 11/12/2022     Lab Results  Component Value Date   ALBUMIN 4.4 10/14/2023   ALBUMIN 4.3 04/06/2023   ALBUMIN 4.4 05/01/2021    No results found for: MG Lab Results  Component Value Date   VD25OH 58 11/05/2023    No results found for: PREALBUMIN    Latest Ref Rng & Units 10/14/2023    9:03 AM 04/06/2023   10:27 AM 12/02/2021    9:44 AM  CBC EXTENDED  WBC 4.0 - 10.5 K/uL 7.9  7.0  7.5   RBC 3.87 - 5.11 Mil/uL 4.98  5.06  5.00   Hemoglobin 12.0 - 15.0 g/dL 87.7  87.4  88.2   HCT 36.0 - 46.0 % 38.7  39.7  39.3   Platelets 150.0 - 400.0 K/uL 389.0  392.0  360   NEUT# 1.4 - 7.7 K/uL 5.7  5.3    Lymph# 0.7 - 4.0 K/uL 1.7  1.3       There is no height or weight on file to calculate BMI.  Orders:  No orders of the defined types were placed in this encounter.  No orders of the defined types were placed in this encounter.    Procedures: No procedures performed  Clinical Data: No additional findings.  ROS:  All other systems  negative, except as noted in the HPI. Review of Systems  Objective: Vital Signs: There were no vitals taken for this visit.  Specialty Comments:  No specialty comments available.  PMFS History: Patient Active Problem List   Diagnosis Date Noted   Age-related osteoporosis without current pathological fracture 10/14/2023   History of asthma 04/06/2023   Incisional hernia 12/10/2021   S/P hernia repair 12/10/2021   Diverticulosis 08/12/2021   History of colonic polyps 08/12/2021   Iron deficiency anemia 08/12/2021   Multiple allergies 05/01/2021   Chronic anemia 05/01/2021   Incisional hernia, without obstruction or gangrene 07/26/2020   Heart murmur 07/26/2020   Moderate persistent asthma with acute exacerbation 07/13/2019   Primary osteoarthritis involving multiple joints 07/14/2018   Chronic joint pain 07/14/2018   Dyslipidemia associated with type 2 diabetes mellitus (HCC) 01/14/2018   Hypertension associated with diabetes (HCC) 07/07/2017   Hyperlipidemia 07/07/2017   Past Medical History:  Diagnosis Date   Anemia    Asthma    Diabetes (HCC)    Hyperlipidemia    Hypertension  Obesity    osteoarthritis     Family History  Problem Relation Age of Onset   Heart disease Mother        stent   Hypertension Mother    Stroke Mother    Diabetes Sister    Hypertension Sister    Ulcerative colitis Sister    Colon cancer Neg Hx    Stomach cancer Neg Hx    Esophageal cancer Neg Hx    Rectal cancer Neg Hx    Breast cancer Neg Hx     Past Surgical History:  Procedure Laterality Date   COLONOSCOPY     HERNIA REPAIR     INSERTION OF MESH N/A 12/10/2021   Procedure: INSERTION OF MESH;  Surgeon: Rubin Calamity, MD;  Location: [facility] OR;  Service: General;  Laterality: N/A;   OVARIAN CYST REMOVAL     PARTIAL KNEE ARTHROPLASTY Left 2021   UPPER GASTROINTESTINAL ENDOSCOPY     XI ROBOTIC ASSISTED VENTRAL HERNIA N/A 12/10/2021   Procedure: ROBOTIC INCISIONAL HERNIA REPAIR  WITH MESH;  Surgeon: Rubin Calamity, MD;  Location: [facility] OR;  Service: General;  Laterality: N/A;   Social History   Occupational History   Occupation: Retired  Tobacco Use   Smoking status: Never   Smokeless tobacco: Never  Vaping Use   Vaping status: Never Used  Substance and Sexual Activity   Alcohol use: Not Currently    Comment: socially   Drug use: Never   Sexual activity: Not on file

## 2023-12-21 NOTE — Addendum Note (Signed)
 Addended by: Zuly Belkin on: 12/21/2023 12:12 PM   Modules accepted: Orders

## 2023-12-28 ENCOUNTER — Other Ambulatory Visit: Payer: Self-pay | Admitting: Emergency Medicine

## 2023-12-28 DIAGNOSIS — E785 Hyperlipidemia, unspecified: Secondary | ICD-10-CM

## 2024-01-04 ENCOUNTER — Other Ambulatory Visit: Payer: Self-pay | Admitting: Emergency Medicine

## 2024-01-10 ENCOUNTER — Other Ambulatory Visit: Payer: Self-pay | Admitting: Emergency Medicine

## 2024-01-15 ENCOUNTER — Ambulatory Visit (INDEPENDENT_AMBULATORY_CARE_PROVIDER_SITE_OTHER)

## 2024-01-15 VITALS — Ht 62.0 in | Wt 181.0 lb

## 2024-01-15 DIAGNOSIS — Z1211 Encounter for screening for malignant neoplasm of colon: Secondary | ICD-10-CM

## 2024-01-15 DIAGNOSIS — Z0000 Encounter for general adult medical examination without abnormal findings: Secondary | ICD-10-CM | POA: Diagnosis not present

## 2024-01-15 NOTE — Progress Notes (Signed)
 Chief Complaint  Patient presents with   Medicare Wellness     Subjective:  Please attest and cosign this visit due to patients primary care provider not being in the office at the time the visit was completed.  (Pt of Dr CHRISTELLA. Purcell)   Catherine Cline is a 68 y.o. female who presents for a Medicare Annual Wellness Visit.  Visit info / Clinical Intake: Medicare Wellness Visit Type:: Subsequent Annual Wellness Visit Persons participating in visit and providing information:: patient Medicare Wellness Visit Mode:: Telephone If telephone:: video declined Since this visit was completed virtually, some vitals may be partially provided or unavailable. Missing vitals are due to the limitations of the virtual format.: Documented vitals are patient reported If Telephone or Video please confirm:: I connected with patient using audio/video enable telemedicine. I verified patient identity with two identifiers, discussed telehealth limitations, and patient agreed to proceed. Patient Location:: Home Provider Location:: Office Interpreter Needed?: No Pre-visit prep was completed: yes AWV questionnaire completed by patient prior to visit?: yes Date:: 01/14/24 Living arrangements:: (!) lives alone Patient's Overall Health Status Rating: good Typical amount of pain: none Does pain affect daily life?: no Are you currently prescribed opioids?: no  Dietary Habits and Nutritional Risks How many meals a day?: 2 Eats fruit and vegetables daily?: yes Most meals are obtained by: preparing own meals In the last 2 weeks, have you had any of the following?: none Diabetic:: (!) yes Any non-healing wounds?: no How often do you check your BS?: 0 Would you like to be referred to a Nutritionist or for Diabetic Management? : no  Functional Status Activities of Daily Living (to include ambulation/medication): Independent Ambulation: Independent with device- listed below Home Assistive Devices/Equipment:  Eyeglasses; Cane; Nebulizers Medication Administration: Independent Home Management (perform basic housework or laundry): Independent Manage your own finances?: yes Primary transportation is: driving Concerns about vision?: no *vision screening is required for WTM* Concerns about hearing?: no  Fall Screening Falls in the past year?: 1 Number of falls in past year: 0 (1) Was there an injury with Fall?: 1 (fractured ribs) Fall Risk Category Calculator: 2 Patient Fall Risk Level: Moderate Fall Risk  Fall Risk Patient at Risk for Falls Due to: Impaired balance/gait Fall risk Follow up: Falls evaluation completed; Falls prevention discussed  Home and Transportation Safety: All rugs have non-skid backing?: N/A, no rugs All stairs or steps have railings?: N/A, no stairs Grab bars in the bathtub or shower?: yes Have non-skid surface in bathtub or shower?: yes Good home lighting?: yes Regular seat belt use?: yes Hospital stays in the last year:: no  Cognitive Assessment Difficulty concentrating, remembering, or making decisions? : no Will 6CIT or Mini Cog be Completed: yes What year is it?: 0 points What month is it?: 0 points Give patient an address phrase to remember (5 components): [address] [place], [place] About what time is it?: 0 points Count backwards from 20 to 1: 0 points Say the months of the year in reverse: 0 points Repeat the address phrase from earlier: 0 points 6 CIT Score: 0 points  Advance Directives (For Healthcare) Does Patient Have a Medical Advance Directive?: No Would patient like information on creating a medical advance directive?: No - Patient declined  Reviewed/Updated  Reviewed/Updated: Reviewed All (Medical, Surgical, Family, Medications, Allergies, Care Teams, Patient Goals)    Allergies (verified) Patient has no known allergies.   Current Medications (verified) Outpatient Encounter Medications as of 01/15/2024  Medication Sig  albuterol   (PROVENTIL ) (2.5 MG/3ML) 0.083% nebulizer solution Take 3 mLs (2.5 mg total) by nebulization every 6 (six) hours as needed for wheezing or shortness of breath.   amLODipine  (NORVASC ) 10 MG tablet Take 1 tablet by mouth once daily   aspirin EC 81 MG tablet Take 81 mg by mouth every evening.   atorvastatin  (LIPITOR) 40 MG tablet Take 1 tablet by mouth once daily   Cholecalciferol (VITAMIN D3) 5000 units CAPS Take 5,000 Units by mouth daily.   denosumab -bbdz (JUBBONTI ) 60 MG/ML SOSY injection Inject 60 mg into the skin every 6 (six) months.   diclofenac  Sodium (VOLTAREN ) 1 % GEL Apply 2 g topically 4 (four) times daily as needed (Knee/Ankle Pain).   fluticasone  (FLONASE ) 50 MCG/ACT nasal spray Place 2 sprays into both nostrils daily.   Krill Oil 1000 MG CAPS Take 1,000 mg by mouth daily.   levocetirizine (XYZAL ) 5 MG tablet TAKE 1 TABLET BY MOUTH ONCE DAILY IN THE EVENING   losartan -hydrochlorothiazide  (HYZAAR) 100-12.5 MG tablet Take 1 tablet by mouth once daily   metFORMIN  (GLUCOPHAGE ) 1000 MG tablet TAKE 1 TABLET BY MOUTH TWICE DAILY WITH MEALS   montelukast  (SINGULAIR ) 10 MG tablet TAKE 1 TABLET BY MOUTH AT BEDTIME   MOUNJARO  12.5 MG/0.5ML Pen INJECT 12.5 MG SUBCUTANEOUSLY ONCE A WEEK   omeprazole  (PRILOSEC) 20 MG capsule Take 1 capsule (20 mg total) by mouth daily.   TRELEGY ELLIPTA  100-62.5-25 MCG/ACT AEPB INHALE 1 PUFF ONCE DAILY   Facility-Administered Encounter Medications as of 01/15/2024  Medication   denosumab  (PROLIA ) injection 60 mg    History: Past Medical History:  Diagnosis Date   Anemia    Asthma    Diabetes (HCC)    Hyperlipidemia    Hypertension    Obesity    osteoarthritis    Past Surgical History:  Procedure Laterality Date   COLONOSCOPY     HERNIA REPAIR     INSERTION OF MESH N/A 12/10/2021   Procedure: INSERTION OF MESH;  Surgeon: Rubin Calamity, MD;  Location: [facility] OR;  Service: General;  Laterality: N/A;   OVARIAN CYST REMOVAL     PARTIAL KNEE  ARTHROPLASTY Left 2021   UPPER GASTROINTESTINAL ENDOSCOPY     XI ROBOTIC ASSISTED VENTRAL HERNIA N/A 12/10/2021   Procedure: ROBOTIC INCISIONAL HERNIA REPAIR WITH MESH;  Surgeon: Rubin Calamity, MD;  Location: [facility] OR;  Service: General;  Laterality: N/A;   Family History  Problem Relation Age of Onset   Heart disease Mother        stent   Hypertension Mother    Stroke Mother    Diabetes Sister    Hypertension Sister    Ulcerative colitis Sister    Colon cancer Neg Hx    Stomach cancer Neg Hx    Esophageal cancer Neg Hx    Rectal cancer Neg Hx    Breast cancer Neg Hx    Social History   Occupational History   Occupation: Retired  Tobacco Use   Smoking status: Never   Smokeless tobacco: Never  Vaping Use   Vaping status: Never Used  Substance and Sexual Activity   Alcohol use: Not Currently    Comment: socially   Drug use: Never   Sexual activity: Yes   Tobacco Counseling Counseling given: Not Answered  SDOH Screenings   Food Insecurity: Food Insecurity Present (01/15/2024)  Housing: Unknown (01/15/2024)  Transportation Needs: No Transportation Needs (01/15/2024)  Utilities: Not At Risk (01/15/2024)  Depression (PHQ2-9): Low Risk (01/15/2024)  Financial Resource  Strain: Low Risk (01/14/2024)  Physical Activity: Insufficiently Active (01/15/2024)  Social Connections: Socially Isolated (01/15/2024)  Stress: No Stress Concern Present (01/15/2024)  Tobacco Use: Low Risk (01/15/2024)  Health Literacy: Adequate Health Literacy (01/15/2024)   See flowsheets for full screening details  Depression Screen PHQ 2 & 9 Depression Scale- Over the past 2 weeks, how often have you been bothered by any of the following problems? Little interest or pleasure in doing things: 0 Feeling down, depressed, or hopeless (PHQ Adolescent also includes...irritable): 1 (loss of Mother) PHQ-2 Total Score: 1 Trouble falling or staying asleep, or sleeping too much: 1 (loss of Mother but  usually sleeps well) Feeling tired or having little energy: 0 Poor appetite or overeating (PHQ Adolescent also includes...weight loss): 0 Feeling bad about yourself - or that you are a failure or have let yourself or your family down: 0 Trouble concentrating on things, such as reading the newspaper or watching television (PHQ Adolescent also includes...like school work): 0 Moving or speaking so slowly that other people could have noticed. Or the opposite - being so fidgety or restless that you have been moving around a lot more than usual: 0 Thoughts that you would be better off dead, or of hurting yourself in some way: 0 PHQ-9 Total Score: 2 If you checked off any problems, how difficult have these problems made it for you to do your work, take care of things at home, or get along with other people?: Not difficult at all  Depression Treatment Depression Interventions/Treatment : EYV7-0 Score <4 Follow-up Not Indicated; Medication     Goals Addressed               This Visit's Progress     Patient Stated (pt-stated)        Patient stated she plans to continue stretching and walk more             Objective:    Today's Vitals   01/15/24 1018  Weight: 181 lb (82.1 kg)  Height: 5' 2 (1.575 m)   Body mass index is 33.11 kg/m.  Hearing/Vision screen Hearing Screening - Comments:: Denies hearing difficulties   Vision Screening - Comments:: Wears eyeglasses for reading - up to date with routine eye exams with an Optometrist Immunizations and Health Maintenance Health Maintenance  Topic Date Due   Zoster Vaccines- Shingrix (2 of 2) 09/06/2020   FOOT EXAM  10/11/2020   Fecal DNA (Cologuard)  07/19/2021   OPHTHALMOLOGY EXAM  03/02/2024   Diabetic kidney evaluation - Urine ACR  04/05/2024   HEMOGLOBIN A1C  04/12/2024   COVID-19 Vaccine (6 - 2025-26 season) 04/15/2024   Diabetic kidney evaluation - eGFR measurement  10/13/2024   Medicare Annual Wellness (AWV)  01/14/2025    Mammogram  05/10/2025   DTaP/Tdap/Td (2 - Td or Tdap) 07/08/2027   Pneumococcal Vaccine: 50+ Years  Completed   Influenza Vaccine  Completed   Bone Density Scan  Completed   Hepatitis C Screening  Completed   Meningococcal B Vaccine  Aged Out   Colonoscopy  Discontinued        Assessment/Plan:  This is a routine wellness examination for Catherine Cline.  Cologuard status: Ordered today  Patient Care Team: Purcell Emil Schanz, MD as PCP - General (Internal Medicine) Loni Soyla LABOR, MD as PCP - Cardiology (Cardiology)  I have personally reviewed and noted the following in the patients chart:   Medical and social history Use of alcohol, tobacco or illicit drugs  Current medications  and supplements including opioid prescriptions. Functional ability and status Nutritional status Physical activity Advanced directives List of other physicians Hospitalizations, surgeries, and ER visits in previous 12 months Vitals Screenings to include cognitive, depression, and falls Referrals and appointments  Orders Placed This Encounter  Procedures   Cologuard   In addition, I have reviewed and discussed with patient certain preventive protocols, quality metrics, and best practice recommendations. A written personalized care plan for preventive services as well as general preventive health recommendations were provided to patient.   Verdie CHRISTELLA Saba, CMA   01/15/2024   Return in 1 year (on 01/14/2025).  After Visit Summary: (MyChart) Due to this being a telephonic visit, the after visit summary with patients personalized plan was offered to patient via MyChart   Nurse Notes: Appointment(s) made: (scheduled 2026 AWV/CPE appts)

## 2024-01-15 NOTE — Patient Instructions (Addendum)
 Catherine Cline,  Thank you for taking the time for your Medicare Wellness Visit. I appreciate your continued commitment to your health goals. Please review the care plan we discussed, and feel free to reach out if I can assist you further.  Please note that Annual Wellness Visits do not include a physical exam. Some assessments may be limited, especially if the visit was conducted virtually. If needed, we may recommend an in-person follow-up with your provider.  Ongoing Care Seeing your primary care provider every 3 to 6 months helps us  monitor your health and provide consistent, personalized care.   Referrals If a referral was made during today's visit and you haven't received any updates within two weeks, please contact the referred provider directly to check on the status.  Recommended Screenings:  Health Maintenance  Topic Date Due   Zoster (Shingles) Vaccine (2 of 2) 09/06/2020   Complete foot exam   10/11/2020   Cologuard (Stool DNA test)  07/19/2021   Eye exam for diabetics  03/02/2024   Yearly kidney health urinalysis for diabetes  04/05/2024   Hemoglobin A1C  04/12/2024   COVID-19 Vaccine (6 - 2025-26 season) 04/15/2024   Yearly kidney function blood test for diabetes  10/13/2024   Medicare Annual Wellness Visit  01/14/2025   Breast Cancer Screening  05/10/2025   DTaP/Tdap/Td vaccine (2 - Td or Tdap) 07/08/2027   Pneumococcal Vaccine for age over 14  Completed   Flu Shot  Completed   Osteoporosis screening with Bone Density Scan  Completed   Hepatitis C Screening  Completed   Meningitis B Vaccine  Aged Out   Colon Cancer Screening  Discontinued       01/15/2024   10:20 AM  Advanced Directives  Does Patient Have a Medical Advance Directive? No  Would patient like information on creating a medical advance directive? No - Patient declined    Vision: Annual vision screenings are recommended for early detection of glaucoma, cataracts, and diabetic retinopathy. These exams  can also reveal signs of chronic conditions such as diabetes and high blood pressure.  Dental: Annual dental screenings help detect early signs of oral cancer, gum disease, and other conditions linked to overall health, including heart disease and diabetes.

## 2024-01-17 ENCOUNTER — Other Ambulatory Visit: Payer: Self-pay | Admitting: Emergency Medicine

## 2024-01-17 DIAGNOSIS — Z8709 Personal history of other diseases of the respiratory system: Secondary | ICD-10-CM

## 2024-02-02 LAB — COLOGUARD: COLOGUARD: NEGATIVE

## 2024-02-16 ENCOUNTER — Other Ambulatory Visit: Payer: Self-pay | Admitting: Emergency Medicine

## 2024-02-16 DIAGNOSIS — Z8709 Personal history of other diseases of the respiratory system: Secondary | ICD-10-CM

## 2024-03-01 ENCOUNTER — Other Ambulatory Visit: Payer: Self-pay | Admitting: Emergency Medicine

## 2024-03-07 ENCOUNTER — Other Ambulatory Visit: Payer: Self-pay | Admitting: Internal Medicine

## 2024-04-13 ENCOUNTER — Ambulatory Visit: Admitting: Emergency Medicine

## 2025-01-23 ENCOUNTER — Encounter: Admitting: Emergency Medicine

## 2025-01-23 ENCOUNTER — Ambulatory Visit
